# Patient Record
Sex: Male | Born: 1980 | Hispanic: Refuse to answer | Marital: Married | Smoking: Current every day smoker
Health system: Southern US, Community
[De-identification: ages and names within clinical notes are randomized; demographics above are authoritative.]

## PROBLEM LIST (undated history)

## (undated) DIAGNOSIS — I10 Essential (primary) hypertension: Secondary | ICD-10-CM

## (undated) DIAGNOSIS — E669 Obesity, unspecified: Secondary | ICD-10-CM

## (undated) DIAGNOSIS — G510 Bell's palsy: Secondary | ICD-10-CM

## (undated) DIAGNOSIS — E119 Type 2 diabetes mellitus without complications: Secondary | ICD-10-CM

## (undated) DIAGNOSIS — G4733 Obstructive sleep apnea (adult) (pediatric): Secondary | ICD-10-CM

## (undated) DIAGNOSIS — F329 Major depressive disorder, single episode, unspecified: Secondary | ICD-10-CM

## (undated) DIAGNOSIS — R945 Abnormal results of liver function studies: Secondary | ICD-10-CM

## (undated) DIAGNOSIS — F32A Depression, unspecified: Secondary | ICD-10-CM

## (undated) HISTORY — PX: SKIN GRAFT: SHX250

## (undated) HISTORY — DX: Obstructive sleep apnea (adult) (pediatric): G47.33

## (undated) HISTORY — DX: Major depressive disorder, single episode, unspecified: F32.9

## (undated) HISTORY — DX: Obesity, unspecified: E66.9

## (undated) HISTORY — DX: Essential (primary) hypertension: I10

## (undated) HISTORY — DX: Type 2 diabetes mellitus without complications: E11.9

## (undated) HISTORY — PX: CERVICAL FUSION: SHX112

## (undated) HISTORY — DX: Depression, unspecified: F32.A

## (undated) HISTORY — DX: Abnormal results of liver function studies: R94.5

---

## 2010-08-03 ENCOUNTER — Encounter: Admission: RE | Admit: 2010-08-03 | Discharge: 2010-08-03 | Payer: Self-pay | Admitting: Neurosurgery

## 2010-08-20 ENCOUNTER — Inpatient Hospital Stay (HOSPITAL_COMMUNITY): Admission: RE | Admit: 2010-08-20 | Discharge: 2010-08-22 | Payer: Self-pay | Admitting: Neurosurgery

## 2011-01-30 ENCOUNTER — Emergency Department (HOSPITAL_COMMUNITY)
Admission: EM | Admit: 2011-01-30 | Discharge: 2011-01-30 | Disposition: A | Discharge status: Home / Self Care | Payer: 59 | Attending: Emergency Medicine | Admitting: Emergency Medicine

## 2011-01-30 ENCOUNTER — Emergency Department (HOSPITAL_COMMUNITY): Payer: 59

## 2011-01-30 DIAGNOSIS — R51 Headache: Secondary | ICD-10-CM | POA: Insufficient documentation

## 2011-01-30 DIAGNOSIS — S0990XA Unspecified injury of head, initial encounter: Secondary | ICD-10-CM | POA: Insufficient documentation

## 2011-01-30 DIAGNOSIS — Y9229 Other specified public building as the place of occurrence of the external cause: Secondary | ICD-10-CM | POA: Insufficient documentation

## 2011-01-30 DIAGNOSIS — S01501A Unspecified open wound of lip, initial encounter: Secondary | ICD-10-CM | POA: Insufficient documentation

## 2011-01-30 DIAGNOSIS — M542 Cervicalgia: Secondary | ICD-10-CM | POA: Insufficient documentation

## 2011-01-30 DIAGNOSIS — S0003XA Contusion of scalp, initial encounter: Secondary | ICD-10-CM | POA: Insufficient documentation

## 2011-01-30 DIAGNOSIS — S0510XA Contusion of eyeball and orbital tissues, unspecified eye, initial encounter: Secondary | ICD-10-CM | POA: Insufficient documentation

## 2011-03-11 LAB — CBC
HCT: 49.1 % (ref 39.0–52.0)
Hemoglobin: 17 g/dL (ref 13.0–17.0)
MCH: 31.8 pg (ref 26.0–34.0)
MCHC: 34.6 g/dL (ref 30.0–36.0)
MCV: 91.9 fL (ref 78.0–100.0)
Platelets: 225 10*3/uL (ref 150–400)
RBC: 5.34 MIL/uL (ref 4.22–5.81)
RDW: 11.9 % (ref 11.5–15.5)
WBC: 10 10*3/uL (ref 4.0–10.5)

## 2011-07-06 ENCOUNTER — Inpatient Hospital Stay (INDEPENDENT_AMBULATORY_CARE_PROVIDER_SITE_OTHER)
Admission: RE | Admit: 2011-07-06 | Discharge: 2011-07-06 | Disposition: A | Discharge status: Home / Self Care | Payer: 59 | Source: Ambulatory Visit | Attending: Emergency Medicine | Admitting: Emergency Medicine

## 2011-07-06 ENCOUNTER — Ambulatory Visit (INDEPENDENT_AMBULATORY_CARE_PROVIDER_SITE_OTHER): Payer: 59

## 2011-07-06 ENCOUNTER — Encounter (HOSPITAL_COMMUNITY): Payer: Self-pay

## 2011-07-06 DIAGNOSIS — S93409A Sprain of unspecified ligament of unspecified ankle, initial encounter: Secondary | ICD-10-CM
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
## 2012-05-28 ENCOUNTER — Ambulatory Visit (INDEPENDENT_AMBULATORY_CARE_PROVIDER_SITE_OTHER): Payer: 59 | Admitting: Family Medicine

## 2012-05-28 ENCOUNTER — Encounter: Payer: Self-pay | Admitting: Family Medicine

## 2012-05-28 VITALS — BP 120/70 | HR 86 | Wt 362.0 lb

## 2012-05-28 DIAGNOSIS — R109 Unspecified abdominal pain: Secondary | ICD-10-CM

## 2012-05-28 NOTE — Patient Instructions (Addendum)
Take 2 Prilosec a day for the next 2 weeks and if no improvement, call me.

## 2012-05-28 NOTE — Progress Notes (Signed)
  Subjective:    Patient ID: Andre [name], male    DOB: 04-Feb-1981, 31 y.o.   MRN: [id number]  HPI He is here for evaluation of a six-month history of intermittent midepigastric discomfort with nausea. When he does have the discomfort, food does make it worse. He also notes that TUMS does help with the indigestion symptoms that he describes as different than the midepigastric discomfort. He's had no vomiting, diarrhea, black tarry stools. He has not tried any medications other than TUMS.   Review of Systems     Objective:   Physical Exam alert and in no distress. Tympanic membranes and canals are normal. Throat is clear. Tonsils are normal. Neck is supple without adenopathy or thyromegaly. Cardiac exam shows a regular sinus rhythm without murmurs or gallops. Lungs are clear to auscultation. Abdominal exam shows no masses or tenderness.        Assessment & Plan:  Abdominal pain, etiology unclear plan recommend 40 mg Prilosec for the next 2 weeks and then call me. At the end of the encounter he then mentioned she would've his mood swings. Further history indicates that there is a family history of mood disorder. Apparently that was diagnosed with this. He notes he has had trouble with this since his teenage years. Recommend he come back for further consultation concerning this.

## 2012-06-11 ENCOUNTER — Encounter: Payer: Self-pay | Admitting: Family Medicine

## 2012-06-11 ENCOUNTER — Ambulatory Visit (INDEPENDENT_AMBULATORY_CARE_PROVIDER_SITE_OTHER): Payer: 59 | Admitting: Family Medicine

## 2012-06-11 VITALS — BP 150/90 | HR 98 | Wt 346.0 lb

## 2012-06-11 DIAGNOSIS — R109 Unspecified abdominal pain: Secondary | ICD-10-CM

## 2012-06-11 DIAGNOSIS — F39 Unspecified mood [affective] disorder: Secondary | ICD-10-CM

## 2012-06-11 NOTE — Progress Notes (Signed)
  Subjective:    Patient ID: Andre [name], male    DOB: August 27, 1981, 31 y.o.   MRN: [id number]  HPI He is here for evaluation of abdominal pain. He states that the heartburn and indigestion have improved tremendously. He still does have abdominal pain but notes that stress does tend to make this worse. Further discussion indicates that he has had difficulty with mood swings for several years. He states that his mood can swing tender 20 times per day. He never gets euphoric. He cites stresses in his life being child custody as well as work. Family history significant for brother and father both with manic depressive disorder.   Review of Systems     Objective:   Physical Exam Alert and in no distress otherwise not examined      Assessment & Plan:  Abdominal pain. Mood disorder. I will refer him to the [facility] for further evaluation and treatment. He did tell him that I would be happy to followup with him concerning this once he is stabilized.

## 2012-08-28 ENCOUNTER — Ambulatory Visit: Payer: 59 | Admitting: Family Medicine

## 2012-12-17 ENCOUNTER — Ambulatory Visit (INDEPENDENT_AMBULATORY_CARE_PROVIDER_SITE_OTHER): Payer: 59 | Admitting: Family Medicine

## 2012-12-17 ENCOUNTER — Encounter: Payer: Self-pay | Admitting: Family Medicine

## 2012-12-17 VITALS — BP 160/100 | HR 75

## 2012-12-17 DIAGNOSIS — H6692 Otitis media, unspecified, left ear: Secondary | ICD-10-CM

## 2012-12-17 DIAGNOSIS — H6690 Otitis media, unspecified, unspecified ear: Secondary | ICD-10-CM

## 2012-12-17 DIAGNOSIS — R030 Elevated blood-pressure reading, without diagnosis of hypertension: Secondary | ICD-10-CM

## 2012-12-17 DIAGNOSIS — R079 Chest pain, unspecified: Secondary | ICD-10-CM

## 2012-12-17 MED ORDER — AMOXICILLIN 875 MG PO TABS: 875.0000 mg | ORAL_TABLET | Freq: Two times a day (BID) | ORAL | Status: DC

## 2012-12-17 NOTE — Patient Instructions (Signed)
Take 4 Advil 3 times per day for the headaches

## 2012-12-17 NOTE — Progress Notes (Signed)
  Subjective:    Patient ID: Andre [name], male    DOB: 12-May-1981, 31 y.o.   MRN: [id number]  HPI He has a two-week history of headache and dizzy, fatigue, rhinorrhea and feeling of his face and ears being hot  Nno sore throat, earache, fever. He also complains of a one-week history of chest discomfort but he does not describe it as heartburn. Activity has no affect.He has tried Aleve without success. He continues on Lamictal. He also states he has a previous history of elevated blood pressure and was on medication however lost weight and his blood pressure stayed in a good range.   Review of Systems     Objective:   Physical Exam alert and in no distress. Tympanic membrane on the right is normal, left shows retraction with fluid and loss of landmarks canals are normal. Throat is clear. Tonsils are normal. Neck is supple without adenopathy or thyromegaly. Cardiac exam shows a regular sinus rhythm without murmurs or gallops. Lungs are clear to auscultation. EKG shows no acute changes       Assessment & Plan:   1. LOM (left otitis media)  amoxicillin (AMOXIL) 875 MG tablet  2. Chest pain  PR ELECTROCARDIOGRAM, COMPLETE  3. Elevated blood pressure     he is to return here in 2 weeks for recheck on his blood pressure and to look at his left TM. Also discussed the need for him to make permanent lifestyle changes in regard to diet and exercise.

## 2012-12-21 ENCOUNTER — Ambulatory Visit (INDEPENDENT_AMBULATORY_CARE_PROVIDER_SITE_OTHER): Payer: 59 | Admitting: Medical

## 2012-12-21 ENCOUNTER — Encounter: Payer: Self-pay | Admitting: Medical

## 2012-12-21 VITALS — BP 132/98 | HR 89 | Temp 98.5°F | Resp 18 | Wt 353.0 lb

## 2012-12-21 DIAGNOSIS — R079 Chest pain, unspecified: Secondary | ICD-10-CM

## 2012-12-21 DIAGNOSIS — R51 Headache: Secondary | ICD-10-CM

## 2012-12-21 DIAGNOSIS — H8110 Benign paroxysmal vertigo, unspecified ear: Secondary | ICD-10-CM

## 2012-12-21 DIAGNOSIS — H6690 Otitis media, unspecified, unspecified ear: Secondary | ICD-10-CM

## 2012-12-21 LAB — POCT URINALYSIS DIPSTICK
Blood, UA: NEGATIVE
Glucose, UA: NEGATIVE
pH, UA: 7

## 2012-12-21 MED ORDER — MECLIZINE HCL 25 MG PO TABS: 25.0000 mg | ORAL_TABLET | Freq: Three times a day (TID) | ORAL | Status: DC | PRN

## 2012-12-21 NOTE — Progress Notes (Signed)
Subjective: Here for c/o severe headaches and dizziness.  Was seen here Monday this week for chest pain, ear infection, fatigue, dizziness, headache, elevated BP.   He still c/o dizziness, fatigue, headache.  Ear is not hurting currently but feels stopped up.  No sore throat, fever, but does have slight pressure in right sinus.  No chest pain, SOB, no swelling.  He reports headaches for weeks, dizziness for weeks.  Dizziness like room is spinning, and worse when moving quickly on the job or getting up real quick.  Had to stay out of work today for symptoms.   He is taking the antibiotic prescribed Monday.  Not taking decongestant or other medications.   Past Medical History  Diagnosis Date  . Depression    Review of Systems Constitutional: -fever, -chills, -sweats, -unexpected weight change ENT: -runny nose, -ear pain, -sore throat Cardiology:  -chest pain, -palpitations, -edema Respiratory: -cough, -shortness of breath, -wheezing Gastroenterology: -abdominal pain, -nausea, -vomiting, -diarrhea, -constipation  Hematology: -bleeding or bruising problems Musculoskeletal: -arthralgias, -myalgias, -joint swelling, -back pain Ophthalmology: -vision changes Urology: -dysuria, -difficulty urinating, -hematuria, -urinary frequency, -urgency Neurology: -weakness, +occasional tingling in hands bilat, -numbness, no speech changes, confusion, hearing loss, +occasional ringing in ears    Objective:   Physical Exam  Filed Vitals:   12/21/12 0938  BP: 130/98  Pulse: 69  Temp: 98.5 F (36.9 C)  Resp: 18    General appearance: alert, no distress, WD/WN  HEENT: normocephalic, sclerae anicteric, PERRLA, EOMi, left TM with some erythema, some retraction, right TM pearly, nares patent, no discharge or erythema, pharynx normal Oral cavity: MMM, no lesions Neck: supple, no lymphadenopathy, no thyromegaly, no masses, no bruits Heart: RRR, normal S1, S2, no murmurs Lungs: CTA bilaterally, no wheezes,  rhonchi, or rales Extremities: no edema, no cyanosis, no clubbing Pulses: 2+ symmetric, upper and lower extremities, normal cap refill Neurological: alert, oriented x 3, CN2-12 intact, strength normal upper extremities and lower extremities, sensation normal throughout, DTRs 2+ throughout, no cerebellar signs, gait normal Psychiatric: normal affect, behavior normal, pleasant    Assessment and Plan :    Encounter Diagnoses  Name Primary?  Marland Kitchen BPPV (benign paroxysmal positional vertigo) Yes  . Otitis media   . Headache   . Chest pain    BPPV - reviewed orthostatic vitals and urinalysis today.  Begin meclizine, advised of diagnosis, increase water intake as he is mildly dehydrated.  Call if not much improved in 1 wk  Otitis media- improved, but finish antibiotic  Headache -OTC Ibuprofen or Tylenol, symptoms should resolve as the BPPV and OM improved.  Recheck if not improving.  Chest pain - resolved   Follow-up if not improving or worse in the next 3-4 days.  F/u as planned to recheck on elevated BP.

## 2012-12-31 ENCOUNTER — Ambulatory Visit (INDEPENDENT_AMBULATORY_CARE_PROVIDER_SITE_OTHER): Payer: 59 | Admitting: Family Medicine

## 2012-12-31 ENCOUNTER — Encounter: Payer: Self-pay | Admitting: Family Medicine

## 2012-12-31 VITALS — BP 150/90 | HR 85 | Ht 75.0 in | Wt 354.0 lb

## 2012-12-31 DIAGNOSIS — I10 Essential (primary) hypertension: Secondary | ICD-10-CM

## 2012-12-31 DIAGNOSIS — H6692 Otitis media, unspecified, left ear: Secondary | ICD-10-CM

## 2012-12-31 DIAGNOSIS — H6690 Otitis media, unspecified, unspecified ear: Secondary | ICD-10-CM

## 2012-12-31 MED ORDER — HYDROCHLOROTHIAZIDE 12.5 MG PO CAPS: 12.5000 mg | ORAL_CAPSULE | Freq: Every day | ORAL | Status: DC

## 2012-12-31 MED ORDER — AMOXICILLIN-POT CLAVULANATE 875-125 MG PO TABS: 1.0000 | ORAL_TABLET | Freq: Two times a day (BID) | ORAL | Status: AC

## 2012-12-31 NOTE — Progress Notes (Signed)
  Subjective:    Patient ID: Andre [name], male    DOB: [birth year], 32 y.o.   MRN: [id number]  HPI He is here for recheck. He is still having some difficulty with intermittent dizziness especially with certain head positions. He also still feels a popping sensation in his left ear.  Review of Systems     Objective:   Physical Exam Alert and in no distress. Left tympanic membrane is show better landmarks but there is still some retraction and erythema. Right tympanic membrane is normal. Blood pressure is recorded.       Assessment & Plan:   1. Hypertension  hydrochlorothiazide (MICROZIDE) 12.5 MG capsule  2. LOM (left otitis media)  amoxicillin-clavulanate (AUGMENTIN) 875-125 MG per tablet   review his record indicates last several blood pressure readings have been elevated and I have therefore decided to treat him. I will also switch him to Augmentin. Recheck blood pressure and tympanic membranes in one month.

## 2013-01-21 ENCOUNTER — Emergency Department: Payer: Self-pay | Admitting: Unknown Physician Specialty

## 2013-01-28 ENCOUNTER — Emergency Department: Payer: Self-pay | Admitting: Emergency Medicine

## 2013-02-04 ENCOUNTER — Ambulatory Visit (INDEPENDENT_AMBULATORY_CARE_PROVIDER_SITE_OTHER): Payer: 59 | Admitting: Family Medicine

## 2013-02-04 ENCOUNTER — Encounter: Payer: Self-pay | Admitting: Family Medicine

## 2013-02-04 VITALS — BP 126/82 | HR 70

## 2013-02-04 DIAGNOSIS — Z79899 Other long term (current) drug therapy: Secondary | ICD-10-CM

## 2013-02-04 DIAGNOSIS — I10 Essential (primary) hypertension: Secondary | ICD-10-CM

## 2013-02-04 DIAGNOSIS — E1159 Type 2 diabetes mellitus with other circulatory complications: Secondary | ICD-10-CM | POA: Insufficient documentation

## 2013-02-04 LAB — ELECTROLYTE PANEL
CO2: 27 mEq/L (ref 19–32)
Chloride: 106 mEq/L (ref 96–112)
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
## 2013-02-05 NOTE — Progress Notes (Signed)
Quick Note:  Pt informed of labs and verbalized understanding ______

## 2013-03-05 ENCOUNTER — Other Ambulatory Visit: Payer: Self-pay | Admitting: Neurosurgery

## 2013-03-05 DIAGNOSIS — M542 Cervicalgia: Secondary | ICD-10-CM

## 2013-03-05 DIAGNOSIS — M5410 Radiculopathy, site unspecified: Secondary | ICD-10-CM

## 2013-03-27 ENCOUNTER — Ambulatory Visit
Admission: RE | Admit: 2013-03-27 | Discharge: 2013-03-27 | Disposition: A | Discharge status: Home / Self Care | Payer: 59 | Source: Ambulatory Visit | Attending: Neurosurgery | Admitting: Neurosurgery

## 2013-03-27 VITALS — BP 137/75 | HR 62 | Ht 76.0 in | Wt 351.0 lb

## 2013-03-27 DIAGNOSIS — M5410 Radiculopathy, site unspecified: Secondary | ICD-10-CM

## 2013-03-27 DIAGNOSIS — M542 Cervicalgia: Secondary | ICD-10-CM

## 2013-03-27 MED ORDER — MEPERIDINE HCL 100 MG/ML IJ SOLN
100.0000 mg | Freq: Once | INTRAMUSCULAR | Status: AC
  Administered 2013-03-27: 100 mg via INTRAMUSCULAR

## 2013-03-27 MED ORDER — IOHEXOL 300 MG/ML  SOLN
10.0000 mL | Freq: Once | INTRAMUSCULAR | Status: AC | PRN
  Administered 2013-03-27: 10 mL via INTRATHECAL

## 2013-03-27 MED ORDER — DIAZEPAM 5 MG PO TABS
10.0000 mg | ORAL_TABLET | Freq: Once | ORAL | Status: AC
  Administered 2013-03-27: 10 mg via ORAL

## 2013-03-27 MED ORDER — ONDANSETRON HCL 4 MG/2ML IJ SOLN
4.0000 mg | Freq: Once | INTRAMUSCULAR | Status: AC
  Administered 2013-03-27: 4 mg via INTRAMUSCULAR

## 2013-04-29 ENCOUNTER — Ambulatory Visit (INDEPENDENT_AMBULATORY_CARE_PROVIDER_SITE_OTHER): Payer: 59 | Admitting: Family Medicine

## 2013-04-29 ENCOUNTER — Encounter: Payer: Self-pay | Admitting: Family Medicine

## 2013-04-29 VITALS — BP 130/88 | HR 79 | Wt 350.0 lb

## 2013-04-29 DIAGNOSIS — R7309 Other abnormal glucose: Secondary | ICD-10-CM

## 2013-04-29 LAB — CBC WITH DIFFERENTIAL/PLATELET
Basophils Relative: 0 % (ref 0–1)
Hemoglobin: 17.1 g/dL — ABNORMAL HIGH (ref 13.0–17.0)
Lymphocytes Relative: 26 % (ref 12–46)
Lymphs Abs: 1.9 10*3/uL (ref 0.7–4.0)
MCHC: 35.3 g/dL (ref 30.0–36.0)
Monocytes Relative: 7 % (ref 3–12)
Neutro Abs: 4.5 10*3/uL (ref 1.7–7.7)
Neutrophils Relative %: 65 % (ref 43–77)
RBC: 5.41 MIL/uL (ref 4.22–5.81)
WBC: 7 10*3/uL (ref 4.0–10.5)

## 2013-04-29 LAB — POCT URINALYSIS DIPSTICK
Glucose, UA: NEGATIVE
Nitrite, UA: NEGATIVE
Spec Grav, UA: 1.02
Urobilinogen, UA: NEGATIVE

## 2013-04-29 LAB — POCT GLYCOSYLATED HEMOGLOBIN (HGB A1C): Hemoglobin A1C: 5.5

## 2013-04-29 LAB — HEMOGLOBIN A1C
Hgb A1c MFr Bld: 5.4 % (ref <5.7)
Mean Plasma Glucose: 108 mg/dL (ref <117)

## 2013-04-29 LAB — LIPID PANEL
Cholesterol: 142 mg/dL (ref 0–200)
Total CHOL/HDL Ratio: 4.6 Ratio

## 2013-04-29 LAB — COMPREHENSIVE METABOLIC PANEL
Albumin: 4.2 g/dL (ref 3.5–5.2)
Alkaline Phosphatase: 69 U/L (ref 39–117)
Chloride: 106 mEq/L (ref 96–112)
Glucose, Bld: 87 mg/dL (ref 70–99)
Potassium: 4.2 mEq/L (ref 3.5–5.3)
Sodium: 139 mEq/L (ref 135–145)
Total Protein: 6.6 g/dL (ref 6.0–8.3)

## 2013-04-29 NOTE — Progress Notes (Signed)
  Subjective:    Patient ID: Andre [name], male    DOB: Dec 09, 1981, 32 y.o.   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 The exam showed sugar in his urine. A fingerstick at that time was 136 however he states that he ate prior to that.he does not complain of polyuria, polydipsia or weight loss.   Review of Systems     Objective:   Physical Exam Alert and in no distress. Hemoglobin A1c is 5.5 Urine dipstick showed only trace protein and no sugar       Assessment & Plan:  Abnormal blood sugar - Plan: CBC with Differential, Comprehensive metabolic panel, Lipid panel, Hemoglobin A1c  Morbid obesity I will repeat the hemoglobin A1c and get routine blood screening on him. Discussed the diagnosis of diabetes and also discussed diet and exercise changes with him. Told him that no matter what the results show, in making permanent changes in his lifestyle to help reduce his weight would be to his benefit.

## 2013-04-29 NOTE — Addendum Note (Signed)
Addended by: Barbette Or A on: 04/29/2013 12:30 PM   Modules accepted: Orders

## 2013-04-30 NOTE — Progress Notes (Signed)
Quick Note:  Pt informed and form faxed and pt coming by to pick up ______

## 2013-04-30 NOTE — Progress Notes (Signed)
Quick Note:  Fax the paperwork to the [facility] doctor with a copy of the lab data. Call the patient and in no come by and pick up the form and that we send information to Dr. ______

## 2014-04-07 ENCOUNTER — Ambulatory Visit
Admission: RE | Admit: 2014-04-07 | Discharge: 2014-04-07 | Disposition: A | Discharge status: Home / Self Care | Payer: 59 | Source: Ambulatory Visit | Attending: Medical | Admitting: Medical

## 2014-04-07 ENCOUNTER — Encounter: Payer: Self-pay | Admitting: Medical

## 2014-04-07 ENCOUNTER — Ambulatory Visit (INDEPENDENT_AMBULATORY_CARE_PROVIDER_SITE_OTHER): Payer: 59 | Admitting: Medical

## 2014-04-07 VITALS — BP 122/80 | HR 80 | Temp 98.3°F | Resp 16 | Wt 346.0 lb

## 2014-04-07 DIAGNOSIS — R509 Fever, unspecified: Secondary | ICD-10-CM

## 2014-04-07 DIAGNOSIS — N5082 Scrotal pain: Secondary | ICD-10-CM

## 2014-04-07 DIAGNOSIS — R059 Cough, unspecified: Secondary | ICD-10-CM

## 2014-04-07 DIAGNOSIS — R05 Cough: Secondary | ICD-10-CM

## 2014-04-07 DIAGNOSIS — R042 Hemoptysis: Secondary | ICD-10-CM

## 2014-04-07 DIAGNOSIS — R61 Generalized hyperhidrosis: Secondary | ICD-10-CM

## 2014-04-07 DIAGNOSIS — F17200 Nicotine dependence, unspecified, uncomplicated: Secondary | ICD-10-CM

## 2014-04-07 DIAGNOSIS — N509 Disorder of male genital organs, unspecified: Secondary | ICD-10-CM

## 2014-04-07 DIAGNOSIS — R0602 Shortness of breath: Secondary | ICD-10-CM

## 2014-04-07 LAB — CBC WITH DIFFERENTIAL/PLATELET
BASOS ABS: 0 10*3/uL (ref 0.0–0.1)
BASOS PCT: 0 % (ref 0–1)
Eosinophils Absolute: 0.1 10*3/uL (ref 0.0–0.7)
Eosinophils Relative: 2 % (ref 0–5)
HCT: 47.3 % (ref 39.0–52.0)
Hemoglobin: 16.7 g/dL (ref 13.0–17.0)
Lymphocytes Relative: 36 % (ref 12–46)
Lymphs Abs: 1.7 10*3/uL (ref 0.7–4.0)
MCH: 32 pg (ref 26.0–34.0)
MCHC: 35.3 g/dL (ref 30.0–36.0)
MCV: 90.6 fL (ref 78.0–100.0)
MONO ABS: 0.6 10*3/uL (ref 0.1–1.0)
Monocytes Relative: 12 % (ref 3–12)
NEUTROS ABS: 2.4 10*3/uL (ref 1.7–7.7)
Neutrophils Relative %: 50 % (ref 43–77)
PLATELETS: 211 10*3/uL (ref 150–400)
RBC: 5.22 MIL/uL (ref 4.22–5.81)
RDW: 12.3 % (ref 11.5–15.5)
WBC: 4.7 10*3/uL (ref 4.0–10.5)

## 2014-04-07 MED ORDER — AMOXICILLIN-POT CLAVULANATE 875-125 MG PO TABS: 1.0000 | ORAL_TABLET | Freq: Two times a day (BID) | ORAL | Status: DC

## 2014-04-07 MED ORDER — ALBUTEROL SULFATE HFA 108 (90 BASE) MCG/ACT IN AERS: 2.0000 | INHALATION_SPRAY | Freq: Four times a day (QID) | RESPIRATORY_TRACT | Status: DC | PRN

## 2014-04-07 NOTE — Progress Notes (Signed)
   Subjective:   Andre [name] is a 33 y.o. male presenting on 04/07/2014 with Cough and Fever  Been sick x 2 months.   Symptoms include over 1 month of cough, productive at times.   2 wk ago saw blood tinged mucous.   Has bloody sinus drainage.   Has sinus pressure, ear pain, some sore throat, gets some nausea and vomiting with cough.  Had fever this past week, and prior to that had intermittent fever.  Has had some night sweats.  Has had some weight loss intentional.  Has bumpy irritated rash on right flank a month ago, attributed this to allergy to detergent.  No sick contacts.   He is a smoker.  15 pack year history +.  Just recently reduced to <1ppd.  He also mentions some ongoing scrotal discomfort.   No discharge, no concern for STD.  No other aggravating or relieving factors.  No other complaint.  Review of Systems ROS as in subjective      Objective:    Filed Vitals:   04/07/14 1357  BP: 122/80  Pulse: 80  Temp: 98.3 F (36.8 C)  Resp: 16    General appearance: alert, no distress, WD/WN, obese white male HEENT: normocephalic, sclerae anicteric, left TM flat and with erythema, right TM pearly, nares with turbinated edema, mucoid discharge and erythema, pharynx normal Oral cavity: MMM, no lesions Neck: supple, no lymphadenopathy, no thyromegaly, no masses Heart: RRR, normal S1, S2, no murmurs Lungs: decreased breath sounds in general, few scattered wheezes,few rhonchi Abdomen: +bs, soft, non tender, non distended, no masses, no hepatomegaly, no splenomegaly Pulses: 2+ symmetric, upper and lower extremities, normal cap refill GU: +varicoceles bilat, no other mass, no swelling, no erythema, nontender, circumcised, no lymphadenopathy     Assessment: Encounter Diagnoses  Name Primary?  . Cough Yes  . SOB (shortness of breath)   . Tobacco use disorder   . Fever, unspecified   . Night sweat   . Hemoptysis   . Scrotal pain      Plan: discussed possible  causes including bronchitis, possible pneumonia, sinusitis, but can't rule out other process.  Begin Augmentin, albuterol inhaler, stop tobacco advised.  F/u pending studies.  Andre [name] was seen today for cough and fever.  Diagnoses and associated orders for this visit:  Cough - DG Chest 2 View; Future - CBC with Differential  SOB (shortness of breath) - DG Chest 2 View; Future - CBC with Differential  Tobacco use disorder - DG Chest 2 View; Future - CBC with Differential  Fever, unspecified - DG Chest 2 View; Future - CBC with Differential  Night sweat - DG Chest 2 View; Future - CBC with Differential  Hemoptysis - DG Chest 2 View; Future - CBC with Differential  Scrotal pain  Other Orders - amoxicillin-clavulanate (AUGMENTIN) 875-125 MG per tablet; Take 1 tablet by mouth 2 (two) times daily. - albuterol (PROVENTIL HFA;VENTOLIN HFA) 108 (90 BASE) MCG/ACT inhaler; Inhale 2 puffs into the lungs every 6 (six) hours as needed for wheezing or shortness of breath.     Return pending studies.

## 2014-04-08 ENCOUNTER — Encounter: Payer: Self-pay | Admitting: Medical

## 2014-09-30 ENCOUNTER — Telehealth: Payer: Self-pay | Admitting: Medical

## 2014-09-30 ENCOUNTER — Ambulatory Visit (INDEPENDENT_AMBULATORY_CARE_PROVIDER_SITE_OTHER): Payer: 59 | Admitting: Medical

## 2014-09-30 ENCOUNTER — Encounter: Payer: Self-pay | Admitting: Medical

## 2014-09-30 VITALS — BP 130/90 | HR 78 | Temp 98.4°F | Resp 20 | Wt 364.0 lb

## 2014-09-30 DIAGNOSIS — Z720 Tobacco use: Secondary | ICD-10-CM

## 2014-09-30 DIAGNOSIS — J988 Other specified respiratory disorders: Secondary | ICD-10-CM

## 2014-09-30 DIAGNOSIS — T148XXA Other injury of unspecified body region, initial encounter: Secondary | ICD-10-CM

## 2014-09-30 DIAGNOSIS — T148 Other injury of unspecified body region: Secondary | ICD-10-CM

## 2014-09-30 DIAGNOSIS — R0789 Other chest pain: Secondary | ICD-10-CM

## 2014-09-30 DIAGNOSIS — R062 Wheezing: Secondary | ICD-10-CM

## 2014-09-30 DIAGNOSIS — F17200 Nicotine dependence, unspecified, uncomplicated: Secondary | ICD-10-CM

## 2014-09-30 DIAGNOSIS — I10 Essential (primary) hypertension: Secondary | ICD-10-CM

## 2014-09-30 LAB — GLUCOSE, POCT (MANUAL RESULT ENTRY): POC Glucose: 155 mg/dl — AB (ref 70–99)

## 2014-09-30 MED ORDER — ALBUTEROL SULFATE HFA 108 (90 BASE) MCG/ACT IN AERS: 2.0000 | INHALATION_SPRAY | Freq: Four times a day (QID) | RESPIRATORY_TRACT | Status: DC | PRN

## 2014-09-30 MED ORDER — ALBUTEROL SULFATE (2.5 MG/3ML) 0.083% IN NEBU: 2.5000 mg | INHALATION_SOLUTION | Freq: Once | RESPIRATORY_TRACT | Status: DC

## 2014-09-30 MED ORDER — AMOXICILLIN-POT CLAVULANATE 875-125 MG PO TABS: 1.0000 | ORAL_TABLET | Freq: Two times a day (BID) | ORAL | Status: DC

## 2014-09-30 NOTE — Telephone Encounter (Signed)
Patient is aware of his PFT appointment at [facility]

## 2014-09-30 NOTE — Progress Notes (Addendum)
Subjective:    Andre [name] is a 33 y.o. male who presents for evaluation of cough and wheezing.   He reports 2 day hx/o cough, wheezing, SOB, head and chest congestion, headache, feels sick and achy.   Has numerous current sick contacts.  Using nothing for symptoms.  In general he is a 18+ pack year smoker, states that he knows he needs to quit, experiences intermittent SOB and chest discomfort from time to time.  No hx/o heart disease or asthma.  He does sometimes get allergy problems.  He is not sure about family history.  He does report mild swelling in his legs ongoing, has wound of left lower leg that although improved, doesn't seem to went to heal all the way.  Wound been there a month or more, not sure.  Patient's cardiac risk factors are: hypertension, male gender, obesity (BMI >= 30 kg/m2) and smoking/ tobacco exposure. Patient's risk factors for DVT/PE: none. Previous cardiac testing: none.  The following portions of the patient's history were reviewed and updated as appropriate: allergies, current medications, past family history, past medical history, past social history, past surgical history and problem list.  Review of Systems As in subjective   Past Medical History  Diagnosis Date  . Depression     Objective:     BP 130/90  Pulse 78  Temp(Src) 98.4 F (36.9 C) (Oral)  Resp 20  Wt 364 lb (165.109 kg)  SpO2 97%  General appearance: alert, no distress, WD/WN,obese white male  Skin: 3cm linear wound of left medial lower leg that is healing, no open wound, no erythema warmth or fluctuance HEENT: normocephalic, conjunctiva/corneas normal, sclerae anicteric, PERRLA, EOMi, nares with turbinate edema, clear discharge, mild erythema, pharynx with post nasal drainage, mild erythema Oral cavity: MMM, tongue normal, no lesions, no obvious tooth decay Neck: supple, no lymphadenopathy, no thyromegaly, no JVD, no bruits, no masses, normal ROM Chest: non tender, normal shape  and expansion Heart: RRR, normal S1, S2, no murmurs Lungs: scattered expiratory wheezes, +decreased breath sounds right lower lung fields, otherwise no rhonchi or rales Abdomen: +bs, soft, non tender, non distended, no masses, no hepatomegaly, no splenomegaly, no bruits Extremities: 1+ nonpitting LE edema, no cyanosis, no clubbing Pulses: 2+ symmetric, upper and lower extremities, normal cap refill    Adult ECG Report  Indication: chest pressure  Rate: 76 bpm  Rhythm: normal sinus rhythm  QRS Axis: 72 degrees  PR Interval:  QRS Duration: 96ms  QTc:  Conduction Disturbances: none  Other Abnormalities: none  Patient's cardiac risk factors are: hypertension, male gender, obesity (BMI >= 30 kg/m2) and smoking/ tobacco exposure.  EKG comparison: none  Narrative Interpretation: normal EKG     Assessment:   Encounter Diagnoses  Name Primary?  . Wheezing Yes  . Smoker   . Respiratory infection   . Chest discomfort   . Obesity, morbid      Plan   Upon presentation after brief hx/o and exam, began Albuterol nebulized solutoin with some improvement in wheezing and SOB.  We discussed his symptoms, concerns, possible causes.  He appears to have bronchitis picture today, similar to his history in 03/2014.  Given hx/o tobacco use and this being a second similar episode this year, need to pursue PFTs.  He will begin Albuterol inhaler, Augmentin, rest, hydrate well, begin OTC antihistamine at bedtime.  If not improving in 48 hours, call or return.  otherwise referral for PFTs, and will need f/u on BP, obesity,  reducing cardiac risk factors.   He is not ready to quit tobacco although we discussed risk of tobacco and need for cessation.

## 2014-09-30 NOTE — Telephone Encounter (Signed)
pls schedule him for spirometry/PFTs.  He needs to not being using albuterol the morning of or night before the tests.  So schedule out at least a week from now.

## 2015-02-16 ENCOUNTER — Ambulatory Visit (INDEPENDENT_AMBULATORY_CARE_PROVIDER_SITE_OTHER): Payer: 59 | Admitting: Family Medicine

## 2015-02-16 ENCOUNTER — Encounter: Payer: Self-pay | Admitting: Family Medicine

## 2015-02-16 VITALS — BP 130/74 | HR 82 | Ht 75.0 in | Wt 354.0 lb

## 2015-02-16 DIAGNOSIS — F17200 Nicotine dependence, unspecified, uncomplicated: Secondary | ICD-10-CM

## 2015-02-16 DIAGNOSIS — Z716 Tobacco abuse counseling: Secondary | ICD-10-CM

## 2015-02-16 DIAGNOSIS — Z720 Tobacco use: Secondary | ICD-10-CM

## 2015-02-16 MED ORDER — VARENICLINE TARTRATE 0.5 MG X 11 & 1 MG X 42 PO MISC: ORAL | Status: DC

## 2015-02-16 NOTE — Progress Notes (Signed)
   Subjective:    Patient ID: Andre [name], male    DOB: 1981-04-22, 34 y.o.   MRN: [id number]  HPI He is here for consult concerning quitting smoking. In the past he has tried gum, patches, cold [place]. This is work. He admits to smoking when he is bored, mad, riding in the car, after eating, on the phone and drinking. He apparently did call one time to an 800 number to get help. Smokes roughly 2 packs per day and has done so for approximately 18 years. Review of Systems     Objective:   Physical Exam Alert and in no distress otherwise not examined       Assessment & Plan:  Tobacco use disorder - Plan: varenicline (CHANTIX STARTING MONTH PAK) 0.5 MG X 11 & 1 MG X 42 tablet  Encounter for smoking cessation counseling - Plan: varenicline (CHANTIX STARTING MONTH PAK) 0.5 MG X 11 & 1 MG X 42 tablet  15 minutes spent discussing smoking cessation with him in regard to habit versus addiction. He would like to start Chantix. Recommend that he make a list of things to do instead of smoking. Also discussed possibly having a contest between him and the friend with $10 each day going into a glass jar. If there is failure the money has to be given to the other person. Recheck here in one month. Also recommend that he get involved in [place] smoking cessation classes.

## 2015-02-16 NOTE — Patient Instructions (Signed)
Off [phone] or you can try 800 quit now. Make a list of things that you will do when your board, mad, riding in the car, after eating, on the phone and drinking. Keep that list with you

## 2015-03-09 ENCOUNTER — Telehealth: Payer: Self-pay | Admitting: Family Medicine

## 2015-03-10 NOTE — Telephone Encounter (Signed)
Have him schedule an appointment so we can discuss the medicine. It has different side effects and needs to be discussed

## 2015-03-10 NOTE — Telephone Encounter (Signed)
P.A. Chantix denied, pt needs trial of Buproprion. Do you want to switch?

## 2015-03-16 ENCOUNTER — Ambulatory Visit: Payer: 59 | Admitting: Medical

## 2015-03-16 ENCOUNTER — Ambulatory Visit: Payer: 59 | Admitting: Family Medicine

## 2015-03-23 ENCOUNTER — Ambulatory Visit (INDEPENDENT_AMBULATORY_CARE_PROVIDER_SITE_OTHER): Payer: 59 | Admitting: Medical

## 2015-03-23 ENCOUNTER — Encounter: Payer: Self-pay | Admitting: Medical

## 2015-03-23 VITALS — BP 132/88 | HR 77 | Temp 98.2°F | Resp 15 | Wt 366.0 lb

## 2015-03-23 DIAGNOSIS — E669 Obesity, unspecified: Secondary | ICD-10-CM | POA: Diagnosis not present

## 2015-03-23 DIAGNOSIS — J449 Chronic obstructive pulmonary disease, unspecified: Secondary | ICD-10-CM

## 2015-03-23 DIAGNOSIS — R202 Paresthesia of skin: Secondary | ICD-10-CM

## 2015-03-23 DIAGNOSIS — G4733 Obstructive sleep apnea (adult) (pediatric): Secondary | ICD-10-CM | POA: Diagnosis not present

## 2015-03-23 DIAGNOSIS — R609 Edema, unspecified: Secondary | ICD-10-CM

## 2015-03-23 DIAGNOSIS — R0600 Dyspnea, unspecified: Secondary | ICD-10-CM

## 2015-03-23 DIAGNOSIS — F17200 Nicotine dependence, unspecified, uncomplicated: Secondary | ICD-10-CM

## 2015-03-23 DIAGNOSIS — Z720 Tobacco use: Secondary | ICD-10-CM

## 2015-03-23 DIAGNOSIS — R0602 Shortness of breath: Secondary | ICD-10-CM

## 2015-03-23 DIAGNOSIS — Z9989 Dependence on other enabling machines and devices: Secondary | ICD-10-CM

## 2015-03-23 LAB — COMPREHENSIVE METABOLIC PANEL
ALK PHOS: 69 U/L (ref 39–117)
ALT: 46 U/L (ref 0–53)
AST: 30 U/L (ref 0–37)
Albumin: 4.1 g/dL (ref 3.5–5.2)
BILIRUBIN TOTAL: 0.6 mg/dL (ref 0.2–1.2)
BUN: 12 mg/dL (ref 6–23)
CO2: 29 meq/L (ref 19–32)
CREATININE: 1.15 mg/dL (ref 0.50–1.35)
Calcium: 9.1 mg/dL (ref 8.4–10.5)
Chloride: 103 mEq/L (ref 96–112)
Glucose, Bld: 89 mg/dL (ref 70–99)
Potassium: 4 mEq/L (ref 3.5–5.3)
Sodium: 141 mEq/L (ref 135–145)
Total Protein: 6.5 g/dL (ref 6.0–8.3)

## 2015-03-23 LAB — POCT URINALYSIS DIPSTICK
BILIRUBIN UA: NEGATIVE
Blood, UA: NEGATIVE
Glucose, UA: NEGATIVE
KETONES UA: NEGATIVE
LEUKOCYTES UA: NEGATIVE
Nitrite, UA: NEGATIVE
Protein, UA: NEGATIVE
Spec Grav, UA: 1.025
Urobilinogen, UA: NEGATIVE
pH, UA: 6

## 2015-03-23 LAB — CBC
HEMATOCRIT: 46.9 % (ref 39.0–52.0)
HEMOGLOBIN: 16.1 g/dL (ref 13.0–17.0)
MCH: 31.2 pg (ref 26.0–34.0)
MCHC: 34.3 g/dL (ref 30.0–36.0)
MCV: 90.9 fL (ref 78.0–100.0)
MPV: 10.2 fL (ref 8.6–12.4)
PLATELETS: 231 10*3/uL (ref 150–400)
RBC: 5.16 MIL/uL (ref 4.22–5.81)
RDW: 12.5 % (ref 11.5–15.5)
WBC: 5.8 10*3/uL (ref 4.0–10.5)

## 2015-03-23 LAB — TSH: TSH: 0.702 u[IU]/mL (ref 0.350–4.500)

## 2015-03-23 MED ORDER — NICOTINE 21 MG/24HR TD PT24: 21.0000 mg | MEDICATED_PATCH | Freq: Every day | TRANSDERMAL | Status: DC

## 2015-03-23 MED ORDER — BUPROPION HCL ER (XL) 150 MG PO TB24: 150.0000 mg | ORAL_TABLET | Freq: Every day | ORAL | Status: DC

## 2015-03-23 NOTE — Patient Instructions (Signed)
YOU CAN QUIT SMOKING!  Talk to your medical provider about using medicines to help you quit. These include nicotine replacement gum, lozenges, or skin patches.  Consider calling 1-800-QUIT-NOW, a toll free 24/7 hotline with free counseling to help you quit.  If you are ready to quit smoking or are thinking about it, congratulations! You have chosen to help yourself be healthier and live longer! There are lots of different ways to quit smoking. Nicotine gum, nicotine patches, a nicotine inhaler, or nicotine nasal spray can help with physical craving. Hypnosis, support groups, and medicines help break the habit of smoking. TIPS TO GET OFF AND STAY OFF CIGARETTES  Learn to predict your moods. Do not let a bad situation be your excuse to have a cigarette. Some situations in your life might tempt you to have a cigarette.   Ask friends and co-workers not to smoke around you.   Make your home smoke-free.   Never have "just one" cigarette. It leads to wanting another and another. Remind yourself of your decision to quit.   On a card, make a list of your reasons for not smoking. Read it at least the same number of times a day as you have a cigarette. Tell yourself everyday, "I do not want to smoke. I choose not to smoke."   Ask someone at home or work to help you with your plan to quit smoking.   Have something planned after you eat or have a cup of coffee. Take a walk or get other exercise to perk you up. This will help to keep you from overeating.   Try a relaxation exercise to calm you down and decrease your stress. Remember, you may be tense and nervous the first two weeks after you quit. This will pass.   Find new activities to keep your hands busy. Play with a pen, coin, or rubber band. Doodle or draw things on paper.   Brush your teeth right after eating. This will help cut down the craving for the taste of tobacco after meals. You can try mouthwash too.   Try gum, breath mints, or diet  candy to keep something in your mouth.  IF YOU SMOKE AND WANT TO QUIT:  Do not stock up on cigarettes. Never buy a carton. Wait until one pack is finished before you buy another.   Never carry cigarettes with you at work or at home.   Keep cigarettes as far away from you as possible. Leave them with someone else.   Never carry matches or a lighter with you.   Ask yourself, "Do I need this cigarette or is this just a reflex?"   Bet with someone that you can quit. Put cigarette money in a piggy bank every morning. If you smoke, you give up the money. If you do not smoke, by the end of the week, you keep the money.   Keep trying. It takes 21 days to change a habit!  Document Released: 10/08/2009 Document Revised: 08/24/2011 Document Reviewed: 10/08/2009 Exit[employer] Patient Information 2012 [employer].   

## 2015-03-23 NOTE — Progress Notes (Signed)
Subjective: Here for recheck on smoking cessation.  Saw Dr. Susann GivensLalonde in February for this and insurance wouldn't cover Chantix.   They require use of Wellbutrin first which he has never used.  Has used the patches in the past and they help some.  Not using patches currently.   Hasn't done counseling for smoking cessation.  Currently smoking 2ppd.   Has been smoking since age 34yo, on average 2ppd.  Having some SOB, cough, wheezing intermittent.  Uses albuterol prn, no specific hx/o asthma/lung disease.   He notes some leg swelling and ankle swelling, feet tingling a lot of late.  After 20-30 minutes has to get up and walk around due to feet tingling.   Has had swelling for months, possibly a year, and tingling sensation a few months now.   No blurred vision, no polyuria, no polydipsia.  Eats fast food a lot given on the go with work.  nonfasting today.   Ate Bacon egg and cheese and hash round biscuit this morning.  Is up and down, climbing a lot at work.    Patient's cardiac risk factors are:  male gender, obesity (BMI >= 30 kg/m2) and smoking/ tobacco exposure.   No other aggravating or relieving factors. No other complaint.   Past Medical History  Diagnosis Date  . Depression     Objective: BP 132/88 mmHg  Pulse 77  Temp(Src) 98.2 F (36.8 C) (Oral)  Resp 15  Wt 366 lb (166.017 kg)  Wt Readings from Last 3 Encounters:  03/23/15 366 lb (166.017 kg)  02/16/15 354 lb (160.573 kg)  09/30/14 364 lb (165.109 kg)   Gen: obese white male, wd, wn, nad Skin: unremarkable, warm, dry HEENT: normocephalic, sclerae anicteric, PERRLA, EOMi, nares patent, no discharge or erythema, pharynx normal Oral cavity: MMM, no lesions Neck: supple, no lymphadenopathy, no thyromegaly, no masses, no JVD, left anterior neck surgical scar Heart: RRR, normal S1, S2, no murmurs Lungs: scattered wheezes, no rhonchi, or rales Abdomen: +bs, soft, non tender, non distended, no masses, no hepatomegaly, no  splenomegaly Musculoskeletal: arms and legs nontender, no swelling, no obvious deformity Extremities: no obvious edema, no cyanosis, no clubbing Pulses: 2+ symmetric, upper and lower extremities, normal cap refill Psychiatric: normal affect, behavior normal, pleasant  Neuro: normal UE and LE sensation, strength, DTRs  Assessment:  Encounter Diagnoses  Name Primary?  Marland Kitchen. Dyspnea Yes  . Tobacco use disorder   . SOB (shortness of breath)   . Edema   . Obesity   . Paresthesia of bilateral legs   . OSA on CPAP     Plan: Labs today, spirometry done in house.    Dyspnea, SOB, tobacco use - spirometry shows moderate obstructive defect, FEV1/FVC 72% predicted and FEV1 61% predicted, lung age 34yo, new diagnosis of COPD.   Begin Symbicrot trial, discussed risks/benefits, proper use, albuterol for rescue inhaler.  Recommended efforts at weight loss, smoking cessation.  F/u 38mo  Smoking cessation - begin trial of Wellbutrin + nicotine patch + counseling.   Advised 1-800-QUIT-NOW for counseling, discussed risks/benefits of Wellbutrin and nicotine patch.   Will have to fail these before insurance will cover chantix.   C/t efforts to cut down on tobacco and quit.     Edema, paresthesias - F/u pending labs.    OSA - c/t CPAP

## 2015-03-24 ENCOUNTER — Other Ambulatory Visit: Payer: Self-pay | Admitting: Family Medicine

## 2015-03-24 ENCOUNTER — Other Ambulatory Visit: Payer: Self-pay | Admitting: Medical

## 2015-03-24 DIAGNOSIS — E669 Obesity, unspecified: Secondary | ICD-10-CM

## 2015-03-24 LAB — HEMOGLOBIN A1C
HEMOGLOBIN A1C: 6 % — AB (ref <5.7)
MEAN PLASMA GLUCOSE: 126 mg/dL — AB (ref <117)

## 2015-03-24 MED ORDER — ALBUTEROL SULFATE HFA 108 (90 BASE) MCG/ACT IN AERS: 2.0000 | INHALATION_SPRAY | Freq: Four times a day (QID) | RESPIRATORY_TRACT | Status: DC | PRN

## 2015-03-24 MED ORDER — BUDESONIDE-FORMOTEROL FUMARATE 160-4.5 MCG/ACT IN AERO: 2.0000 | INHALATION_SPRAY | Freq: Two times a day (BID) | RESPIRATORY_TRACT | Status: DC

## 2015-03-26 ENCOUNTER — Telehealth: Payer: Self-pay | Admitting: Medical

## 2015-03-26 NOTE — Progress Notes (Signed)
Duplicate entry

## 2015-03-27 NOTE — Telephone Encounter (Signed)
[facility] Step 1 approved til 03/25/16, faxed pharmacy, Pt informed

## 2015-04-03 ENCOUNTER — Telehealth: Payer: Self-pay | Admitting: Medical

## 2015-04-03 NOTE — Telephone Encounter (Signed)
lm

## 2015-06-02 ENCOUNTER — Encounter (HOSPITAL_COMMUNITY): Payer: Self-pay | Admitting: *Deleted

## 2015-06-02 ENCOUNTER — Emergency Department (HOSPITAL_COMMUNITY): Payer: 59

## 2015-06-02 ENCOUNTER — Encounter: Payer: Self-pay | Admitting: Medical

## 2015-06-02 ENCOUNTER — Ambulatory Visit (INDEPENDENT_AMBULATORY_CARE_PROVIDER_SITE_OTHER): Payer: 59 | Admitting: Medical

## 2015-06-02 ENCOUNTER — Emergency Department (HOSPITAL_COMMUNITY)
Admission: EM | Admit: 2015-06-02 | Discharge: 2015-06-02 | Discharge status: Against Medical Advice | Payer: 59 | Attending: Emergency Medicine | Admitting: Emergency Medicine

## 2015-06-02 VITALS — BP 132/90 | HR 77 | Temp 98.1°F | Resp 15 | Wt 362.0 lb

## 2015-06-02 DIAGNOSIS — F17200 Nicotine dependence, unspecified, uncomplicated: Secondary | ICD-10-CM

## 2015-06-02 DIAGNOSIS — J449 Chronic obstructive pulmonary disease, unspecified: Secondary | ICD-10-CM

## 2015-06-02 DIAGNOSIS — R41840 Attention and concentration deficit: Secondary | ICD-10-CM | POA: Diagnosis not present

## 2015-06-02 DIAGNOSIS — R200 Anesthesia of skin: Secondary | ICD-10-CM | POA: Insufficient documentation

## 2015-06-02 DIAGNOSIS — R202 Paresthesia of skin: Secondary | ICD-10-CM

## 2015-06-02 DIAGNOSIS — R42 Dizziness and giddiness: Secondary | ICD-10-CM | POA: Diagnosis not present

## 2015-06-02 DIAGNOSIS — Z9989 Dependence on other enabling machines and devices: Secondary | ICD-10-CM

## 2015-06-02 DIAGNOSIS — R208 Other disturbances of skin sensation: Secondary | ICD-10-CM | POA: Diagnosis not present

## 2015-06-02 DIAGNOSIS — Z720 Tobacco use: Secondary | ICD-10-CM | POA: Diagnosis not present

## 2015-06-02 DIAGNOSIS — G4733 Obstructive sleep apnea (adult) (pediatric): Secondary | ICD-10-CM

## 2015-06-02 DIAGNOSIS — R159 Full incontinence of feces: Secondary | ICD-10-CM

## 2015-06-02 DIAGNOSIS — R7301 Impaired fasting glucose: Secondary | ICD-10-CM

## 2015-06-02 LAB — COMPREHENSIVE METABOLIC PANEL
ALT: 69 U/L — ABNORMAL HIGH (ref 17–63)
AST: 40 U/L (ref 15–41)
Albumin: 4 g/dL (ref 3.5–5.0)
Alkaline Phosphatase: 63 U/L (ref 38–126)
Anion gap: 7 (ref 5–15)
BUN: 12 mg/dL (ref 6–20)
CO2: 26 mmol/L (ref 22–32)
Calcium: 9.1 mg/dL (ref 8.9–10.3)
Chloride: 107 mmol/L (ref 101–111)
Creatinine, Ser: 1.13 mg/dL (ref 0.61–1.24)
GFR calc Af Amer: >60 mL/min (ref >60)
GFR calc non Af Amer: >60 mL/min (ref >60)
Glucose, Bld: 94 mg/dL (ref 65–99)
Potassium: 4.1 mmol/L (ref 3.5–5.1)
Sodium: 140 mmol/L (ref 135–145)
Total Bilirubin: 0.6 mg/dL (ref 0.3–1.2)
Total Protein: 7.1 g/dL (ref 6.5–8.1)

## 2015-06-02 LAB — CBC
HEMATOCRIT: 47 % (ref 39.0–52.0)
Hemoglobin: 16.3 g/dL (ref 13.0–17.0)
MCH: 31.7 pg (ref 26.0–34.0)
MCHC: 34.7 g/dL (ref 30.0–36.0)
MCV: 91.3 fL (ref 78.0–100.0)
Platelets: 237 10*3/uL (ref 150–400)
RBC: 5.15 MIL/uL (ref 4.22–5.81)
RDW: 11.6 % (ref 11.5–15.5)
WBC: 7.3 10*3/uL (ref 4.0–10.5)

## 2015-06-02 LAB — DIFFERENTIAL
BASOS PCT: 0 % (ref 0–1)
Basophils Absolute: 0 10*3/uL (ref 0.0–0.1)
EOS PCT: 2 % (ref 0–5)
Eosinophils Absolute: 0.1 10*3/uL (ref 0.0–0.7)
Lymphocytes Relative: 27 % (ref 12–46)
Lymphs Abs: 2 10*3/uL (ref 0.7–4.0)
Monocytes Absolute: 0.5 10*3/uL (ref 0.1–1.0)
Monocytes Relative: 6 % (ref 3–12)
NEUTROS PCT: 65 % (ref 43–77)
Neutro Abs: 4.7 10*3/uL (ref 1.7–7.7)

## 2015-06-02 LAB — I-STAT CHEM 8, ED
BUN: 14 mg/dL (ref 6–20)
Calcium, Ion: 1.11 mmol/L — ABNORMAL LOW (ref 1.12–1.23)
Chloride: 104 mmol/L (ref 101–111)
Creatinine, Ser: 1.2 mg/dL (ref 0.61–1.24)
GLUCOSE: 92 mg/dL (ref 65–99)
HEMATOCRIT: 49 % (ref 39.0–52.0)
Hemoglobin: 16.7 g/dL (ref 13.0–17.0)
Potassium: 3.9 mmol/L (ref 3.5–5.1)
Sodium: 142 mmol/L (ref 135–145)
TCO2: 24 mmol/L (ref 0–100)

## 2015-06-02 LAB — APTT: aPTT: 28 seconds (ref 24–37)

## 2015-06-02 LAB — ETHANOL

## 2015-06-02 LAB — PROTIME-INR
INR: 1.17 (ref 0.00–1.49)
PROTHROMBIN TIME: 15 s (ref 11.6–15.2)

## 2015-06-02 LAB — I-STAT TROPONIN, ED: Troponin i, poc: 0.01 ng/mL (ref 0.00–0.08)

## 2015-06-02 NOTE — Progress Notes (Signed)
Subjective:    Andre [name] is a 34 y.o. male who presents for evaluation of numbness.  He has a history significant for new diagnosis of COPD in march 2016 here, smoker, morbid obesity, OSA on CPAP, impaired glucose and poor diet.  He notes that over the last week has felt tired, has had some problems remember things and concentrating on things that he normally would do on the job without any problem.  His lips felt numb a week ago. Has had right arm tingling and numbness over the past 48 hours, and for the last several hours has noted right facial numbness.  Denies slurred speech, confusion, but has had dizziness.   He denies chest pain, swelling, palpitations, no vision or hearing changes.    Since last visit he did start Symbicort once daily which has helped with breathing, but he still smokes.     Out of the blue he had an episode of fecal incontinence few days ago.  He lives alone.  He has been checking his sugar some, was 94 yesterday.  No other aggravating or relieving factors. No other complaint.  Past Medical History  Diagnosis Date  . Depression    History reviewed. No pertinent family history.   Review of Systems Constitutional: denies fever, chills, sweats, unexpected weight change, anorexia, +fatigue ENT: no runny nose, ear pain, sore throat, hoarseness, sinus pain, hearing loss, epistaxis Cardiology: denies palpitations, edema, orthopnea, paroxysmal nocturnal dyspnea Respiratory: denies cough, shortness of breath, dyspnea on exertion, wheezing, hemoptysis Gastroenterology: denies abdominal pain, nausea, vomiting, diarrhea, constipation,  Hematology: denies bleeding or bruising problems Musculoskeletal: denies arthralgias, myalgias, joint swelling, back pain, neck pain, cramping, gait changes Ophthalmology: denies vision changes Urology: denies dysuria, difficulty urinating, hematuria, urinary frequency, urgency, incontinence Psychology: denies depressed mood,  agitation    Objective:     BP 132/90 mmHg  Pulse 77  Temp(Src) 98.1 F (36.7 C) (Oral)  Resp 15  Wt 362 lb (164.202 kg)  General appearance: alert, no distress, WD/WN,obese white male  HEENT: normocephalic, conjunctiva/corneas normal, sclerae anicteric, PERRLA, EOMi, nares patent, no discharge or erythema, pharynx normal Oral cavity: MMM, tongue normal, teeth normal Neck: supple, no lymphadenopathy, no thyromegaly, no JVD, no bruits, no masses, normal ROM Chest: non tender, normal shape and expansion Heart: RRR, normal S1, S2, no murmurs Lungs: decreased breath sounds in general, no wheezes, rhonchi, or rales Extremities: no edema, no cyanosis, no clubbing Pulses: 2+ symmetric, upper and lower extremities, normal cap refill Neurological: with MMSE missed 1/3 on recall, otherwise normal MMSE, alert, oriented x 3, there is decreased sensation of right side in general to light touch and to discrimination compared to left side including face, arm, and leg, unable to do heel to toe, almost fell backwards on romberg, but no hand asymmetry, otherwise CN2-12 intact, strength normal upper extremities and lower extremities, DTRs 2+ throughout Psychiatric: normal affect, behavior normal, pleasant      Adult ECG Report  Indication: facial numbness  Rate: 73 bpm  Rhythm: normal sinus rhythm  QRS Axis: 45 degrees  PR Interval: 138ms  QRS Duration: 96ms  QTc: 436ms  Conduction Disturbances: none  Other Abnormalities: none  Patient's cardiac risk factors are: male gender, obesity (BMI >= 30 kg/m2), sedentary lifestyle, smoking/ tobacco exposure and borderline diabetes and borderline HTN.  EKG comparison: 2015  Narrative Interpretation: no acute change    Assessment:   Encounter Diagnoses  Name Primary?  . Facial numbness Yes  . Dizziness and giddiness   .  Numbness and tingling of right arm and leg   . COPD (chronic obstructive pulmonary disease) with chronic bronchitis   . Smoker    . Morbid obesity   . Fecal incontinence   . Concentration deficit   . OSA on CPAP   . Impaired fasting blood sugar       Plan:   Given his findings and risk factors for cardiovascular disease, I advised he be seen in the ED to eval for possible TIA/Stroke vs ruling out other process.   He would not go by personal vehicle despite the risks.  He did not have any friends or family close to come get him.  Reluctantly he will go by personal vehicle immediately to [facility] emergency dept.      He has some abnormalities on neuro exam today, need to rule out TIA/CVA.  He is compliant with CPAP, recently started on Sybmicort for COPD which is helping but he continues to smoke.   He has impaired glucose, elevated BP today mildly.    [facility] ED triage and they are aware he is coming.

## 2015-06-02 NOTE — ED Notes (Signed)
Pt stated he was leaving and wasn't going to stay he stated he "didnt have the patience to sit here this long." Attempted to get pt to stay but refused.

## 2015-06-02 NOTE — ED Notes (Signed)
Pt in c/o right sided facial and arm numbness, pt feels like his right arm is weaker than normal, pt states he has some deficits after neck surgery but this feels worse, right grip weaker than left, no droop noted to face, pt states symptoms started yesterday, alert and oriented, no distress noted

## 2015-06-16 NOTE — Addendum Note (Signed)
Addended by: Jac Canavan on: 06/16/2015 09:53 AM   Modules accepted: Orders

## 2015-08-23 ENCOUNTER — Other Ambulatory Visit: Payer: Self-pay

## 2015-08-23 ENCOUNTER — Emergency Department (HOSPITAL_COMMUNITY): Payer: 59

## 2015-08-23 ENCOUNTER — Emergency Department (HOSPITAL_COMMUNITY)
Admission: EM | Admit: 2015-08-23 | Discharge: 2015-08-24 | Disposition: A | Discharge status: Home / Self Care | Payer: 59 | Attending: Emergency Medicine | Admitting: Emergency Medicine

## 2015-08-23 ENCOUNTER — Encounter (HOSPITAL_COMMUNITY): Payer: Self-pay

## 2015-08-23 DIAGNOSIS — G510 Bell's palsy: Secondary | ICD-10-CM | POA: Insufficient documentation

## 2015-08-23 DIAGNOSIS — M542 Cervicalgia: Secondary | ICD-10-CM | POA: Diagnosis not present

## 2015-08-23 DIAGNOSIS — Z79899 Other long term (current) drug therapy: Secondary | ICD-10-CM | POA: Diagnosis not present

## 2015-08-23 DIAGNOSIS — F329 Major depressive disorder, single episode, unspecified: Secondary | ICD-10-CM | POA: Insufficient documentation

## 2015-08-23 DIAGNOSIS — R51 Headache: Secondary | ICD-10-CM | POA: Diagnosis present

## 2015-08-23 DIAGNOSIS — Z720 Tobacco use: Secondary | ICD-10-CM | POA: Insufficient documentation

## 2015-08-23 HISTORY — DX: Bell's palsy: G51.0

## 2015-08-23 LAB — DIFFERENTIAL
Basophils Absolute: 0 10*3/uL (ref 0.0–0.1)
Basophils Relative: 0 % (ref 0–1)
EOS ABS: 0.2 10*3/uL (ref 0.0–0.7)
EOS PCT: 2 % (ref 0–5)
LYMPHS ABS: 2.1 10*3/uL (ref 0.7–4.0)
LYMPHS PCT: 31 % (ref 12–46)
MONO ABS: 0.7 10*3/uL (ref 0.1–1.0)
MONOS PCT: 11 % (ref 3–12)
NEUTROS PCT: 56 % (ref 43–77)
Neutro Abs: 3.7 10*3/uL (ref 1.7–7.7)

## 2015-08-23 LAB — COMPREHENSIVE METABOLIC PANEL
ALBUMIN: 3.8 g/dL (ref 3.5–5.0)
ALK PHOS: 64 U/L (ref 38–126)
ALT: 54 U/L (ref 17–63)
ANION GAP: 8 (ref 5–15)
AST: 33 U/L (ref 15–41)
BILIRUBIN TOTAL: 0.5 mg/dL (ref 0.3–1.2)
BUN: 10 mg/dL (ref 6–20)
CALCIUM: 8.7 mg/dL — AB (ref 8.9–10.3)
CO2: 27 mmol/L (ref 22–32)
Chloride: 103 mmol/L (ref 101–111)
Creatinine, Ser: 1.15 mg/dL (ref 0.61–1.24)
GFR calc Af Amer: >60 mL/min (ref >60)
GLUCOSE: 139 mg/dL — AB (ref 65–99)
POTASSIUM: 3.5 mmol/L (ref 3.5–5.1)
Sodium: 138 mmol/L (ref 135–145)
TOTAL PROTEIN: 6.4 g/dL — AB (ref 6.5–8.1)

## 2015-08-23 LAB — CBC
HEMATOCRIT: 44.9 % (ref 39.0–52.0)
HEMOGLOBIN: 15.7 g/dL (ref 13.0–17.0)
MCH: 32 pg (ref 26.0–34.0)
MCHC: 35 g/dL (ref 30.0–36.0)
MCV: 91.4 fL (ref 78.0–100.0)
Platelets: 220 10*3/uL (ref 150–400)
RBC: 4.91 MIL/uL (ref 4.22–5.81)
RDW: 11.4 % — ABNORMAL LOW (ref 11.5–15.5)
WBC: 6.6 10*3/uL (ref 4.0–10.5)

## 2015-08-23 LAB — PROTIME-INR
INR: 1.18 (ref 0.00–1.49)
Prothrombin Time: 15.1 seconds (ref 11.6–15.2)

## 2015-08-23 LAB — CBG MONITORING, ED: GLUCOSE-CAPILLARY: 131 mg/dL — AB (ref 65–99)

## 2015-08-23 LAB — APTT: aPTT: 30 seconds (ref 24–37)

## 2015-08-23 MED ORDER — SODIUM CHLORIDE 0.9 % IV BOLUS (SEPSIS)
1000.0000 mL | Freq: Once | INTRAVENOUS | Status: AC
  Administered 2015-08-24: 1000 mL via INTRAVENOUS

## 2015-08-23 MED ORDER — KETOROLAC TROMETHAMINE 30 MG/ML IJ SOLN
30.0000 mg | Freq: Once | INTRAMUSCULAR | Status: AC
  Administered 2015-08-24: 30 mg via INTRAVENOUS
  Filled 2015-08-23: qty 1

## 2015-08-23 MED ORDER — METHYLPREDNISOLONE SODIUM SUCC 125 MG IJ SOLR
125.0000 mg | Freq: Once | INTRAMUSCULAR | Status: AC
  Administered 2015-08-24: 125 mg via INTRAVENOUS
  Filled 2015-08-23: qty 2

## 2015-08-23 MED ORDER — METOCLOPRAMIDE HCL 5 MG/ML IJ SOLN
10.0000 mg | Freq: Once | INTRAMUSCULAR | Status: AC
  Administered 2015-08-24: 10 mg via INTRAVENOUS
  Filled 2015-08-23: qty 2

## 2015-08-23 NOTE — ED Provider Notes (Signed)
CSN: [id number]     Arrival date & time 08/23/15  1730 History   This chart was scribed for Loren Racer, MD by Arlan Organ, ED Scribe. This patient was seen in room B18C/B18C and the patient's care was started 11:19 PM.   Chief Complaint  Patient presents with  . Facial Droop  . Headache   The history is provided by the patient. No language interpreter was used.    HPI Comments: Andre [name] is a 34 y.o. male with a PMHx of Bell's Palsy who presents to the Emergency Department complaining of constant, ongoing L sided HA x 2 days. Pt also reports L sided neck pain, weakness and numbness to the L side which has gradually worsened overtime. He has also noted L sided facial droop, slurred speech, loss of taste, and numbness to the tongue. No recent fever or chills. Andre [name] admits to a history of Bell's Palsy as a child. No extremity weakness or numbness.  Past Medical History  Diagnosis Date  . Depression   . Bell's palsy     at 6th or 7th grade on right side    Past Surgical History  Procedure Laterality Date  . Cervical fusion    . Skin graft Right     below right knee, medial    History reviewed. No pertinent family history. Social History  Substance Use Topics  . Smoking status: Current Every Day Smoker -- 2.00 packs/day    Types: Cigarettes  . Smokeless tobacco: Never Used  . Alcohol Use: No    Review of Systems  Constitutional: Negative for fever and chills.  HENT: Negative for facial swelling, sore throat and trouble swallowing.   Eyes: Positive for redness. Negative for visual disturbance.  Respiratory: Negative for cough and shortness of breath.   Cardiovascular: Negative for chest pain.  Gastrointestinal: Negative for nausea, vomiting and abdominal pain.  Musculoskeletal: Positive for myalgias and neck pain. Negative for back pain and neck stiffness.  Skin: Negative for rash and wound.  Neurological: Positive for weakness, numbness and headaches.  Negative for dizziness, syncope and light-headedness.  Psychiatric/Behavioral: Negative for confusion.  All other systems reviewed and are negative.     Allergies  Review of patient's allergies indicates no known allergies.  Home Medications   Prior to Admission medications   Medication Sig Start Date End Date Taking? Authorizing Provider  acyclovir (ZOVIRAX) 400 MG tablet Take 1 tablet (400 mg total) by mouth 5 (five) times daily. 08/24/15 09/02/15  Loren Racer, MD  albuterol (PROVENTIL HFA;VENTOLIN HFA) 108 (90 BASE) MCG/ACT inhaler Inhale 2 puffs into the lungs every 6 (six) hours as needed for wheezing or shortness of breath. 03/24/15   Kermit Balo Tysinger, PA-C  budesonide-formoterol (SYMBICORT) 160-4.5 MCG/ACT inhaler Inhale 2 puffs into the lungs 2 (two) times daily. 03/24/15   Kermit Balo Tysinger, PA-C  buPROPion (WELLBUTRIN XL) 150 MG 24 hr tablet Take 1 tablet (150 mg total) by mouth daily. 03/23/15   Kermit Balo Tysinger, PA-C  predniSONE (DELTASONE) 20 MG tablet Take 3 tabs for 6 days, then take 2 tabs for 2 days and then 1 tab for 2 days 08/24/15   Loren Racer, MD   Triage Vitals: BP 157/82 mmHg  Pulse 57  Temp(Src) 98.3 F (36.8 C) (Oral)  Resp 15  Ht 6' 3.5" (1.918 m)  Wt 364 lb 12.8 oz (165.472 kg)  BMI 44.98 kg/m2  SpO2 98%   Physical Exam  Constitutional: He is oriented to person, place,  and time. He appears well-developed and well-nourished. No distress.  HENT:  Head: Normocephalic and atraumatic.  Mouth/Throat: Oropharynx is clear and moist.  Eyes: EOM are normal. Pupils are equal, round, and reactive to light.  Neck: Normal range of motion. Neck supple.  Patient with no meningismus. He does have tenderness to palpation along the cervical paraspinal muscles on the left. There is no midline tenderness.  Cardiovascular: Normal rate and regular rhythm.   Pulmonary/Chest: Effort normal and breath sounds normal. No respiratory distress. He has no wheezes. He has no rales.   Abdominal: Soft. Bowel sounds are normal.  Musculoskeletal: Normal range of motion. He exhibits no edema or tenderness.  Neurological: He is alert and oriented to person, place, and time.  Patient is alert and oriented x3 with clear, goal oriented speech. Patient has 5/5 motor in all extremities. Bilateral finger-to-nose is normal with no signs of dysmetria. Patient has a normal gait and walks without assistance.  Patient has weakness to the upper and lower left side of the face. There is left-sided facial droop. Patient has difficulty completely closing the left eye. There is no deformity weakness. Patient also has decreased sensation to the left forehead and lower face.  Skin: Skin is warm and dry. No rash noted. No erythema.  Psychiatric: He has a normal mood and affect. His behavior is normal.  Nursing note and vitals reviewed.   ED Course  Procedures (including critical care time)  DIAGNOSTIC STUDIES: Oxygen Saturation is 98% on RA, Normal by my interpretation.    COORDINATION OF CARE: 12:52 AM-Discussed treatment plan with pt at bedside and pt agreed to plan.     Labs Review Labs Reviewed  CBC - Abnormal; Notable for the following:    RDW 11.4 (*)    All other components within normal limits  COMPREHENSIVE METABOLIC PANEL - Abnormal; Notable for the following:    Glucose, Bld 139 (*)    Calcium 8.7 (*)    Total Protein 6.4 (*)    All other components within normal limits  CBG MONITORING, ED - Abnormal; Notable for the following:    Glucose-Capillary 131 (*)    All other components within normal limits  PROTIME-INR  APTT  DIFFERENTIAL  I-STAT TROPOININ, ED  I-STAT CHEM 8, ED    Imaging Review Ct Head Wo Contrast  08/23/2015   CLINICAL DATA:  Acute onset of left-sided facial droop for 2 days. Headache and left-sided weakness. Initial encounter.  EXAM: CT HEAD WITHOUT CONTRAST  TECHNIQUE: Contiguous axial images were obtained from the base of the skull through the  vertex without intravenous contrast.  COMPARISON:  CT of the head performed 06/02/2015  FINDINGS: There is no evidence of acute infarction, mass lesion, or intra- or extra-axial hemorrhage on CT.  The posterior fossa, including the cerebellum, brainstem and fourth ventricle, is within normal limits. The third and lateral ventricles, and basal ganglia are unremarkable in appearance. The cerebral hemispheres are symmetric in appearance, with normal gray-white differentiation. No mass effect or midline shift is seen.  There is no evidence of fracture; visualized osseous structures are unremarkable in appearance. The orbits are within normal limits. The paranasal sinuses and mastoid air cells are well-aerated. No significant soft tissue abnormalities are seen.  IMPRESSION: Unremarkable noncontrast CT of the head.   Electronically Signed   By: Roanna Raider M.D.   On: 08/23/2015 22:39   I have personally reviewed and evaluated these images and lab results as part of my medical decision-making.  EKG Interpretation   Date/Time:  Sunday August 23 2015 21:42:39 EDT Ventricular Rate:  66 PR Interval:  127 QRS Duration: 107 QT Interval:  406 QTC Calculation: 425 R Axis:   94 Text Interpretation:  Age not entered, assumed to be  34 years old for  purpose of ECG interpretation Sinus rhythm Borderline right axis deviation  Confirmed by Lincoln Brigham [phone]) on 08/23/2015 10:20:33 PM Also confirmed by  Ranae Palms  MD, Ralyn Stlaurent ([id number])  on 08/23/2015 11:04:18 PM      MDM   Final diagnoses:  Bell's palsy    I personally performed the services described in this documentation, which was scribed in my presence. The recorded information has been reviewed and is accurate.  Presentation is consistent with Bell's palsy. Patient has both upper and lower facial weakness and numbness. CT head is normal. Laboratory workup is normal. Will start patient on steroids and have instructed patient to follow-up with neurology.  He's been given return precautions and is voiced understanding.    Loren Racer, MD 08/24/15 [phone]

## 2015-08-23 NOTE — ED Notes (Signed)
Pt had brief period of facial switching in triage.  Rn made aware and moved back to room.

## 2015-08-23 NOTE — ED Notes (Signed)
Dr. Madilyn Hook notified of patient's presentation, NIH 6. See new orders.

## 2015-08-23 NOTE — ED Notes (Signed)
Onset 08-20-15 pt started get left sided neck pain and left sided headache, symptoms worsened on 08-21-15, 07-2715 left sided facial droop and numbness, slurred speech, tongue is numb, unable to taste foods.

## 2015-08-23 NOTE — ED Notes (Signed)
  CBG 131  

## 2015-08-24 MED ORDER — PREDNISONE 20 MG PO TABS: ORAL_TABLET | ORAL | Status: DC

## 2015-08-24 MED ORDER — ACYCLOVIR 400 MG PO TABS: 400.0000 mg | ORAL_TABLET | Freq: Every day | ORAL | Status: AC

## 2015-08-24 NOTE — ED Notes (Signed)
Pt verbalized understanding of d/c instructions and has no further questions. Pt stable and NAD. Pt educated on prescriptions to treat bell's palsy.

## 2015-08-24 NOTE — Discharge Instructions (Signed)
Bell's Palsy °Bell's palsy is a condition in which the muscles on one side of the face cannot move (paralysis). This is because the nerves in the face are paralyzed. It is most often thought to be caused by a virus. The virus causes swelling of the nerve that controls movement on one side of the face. The nerve travels through a tight space surrounded by bone. When the nerve swells, it can be compressed by the bone. This results in damage to the protective covering around the nerve. This damage interferes with how the nerve communicates with the muscles of the face. As a result, it can cause weakness or paralysis of the facial muscles.  °Injury (trauma), tumor, and surgery may cause Bell's palsy, but most of the time the cause is unknown. It is a relatively common condition. It starts suddenly (abrupt onset) with the paralysis usually ending within 2 days. Bell's palsy is not dangerous. But because the eye does not close properly, you may need care to keep the eye from getting dry. This can include splinting (to keep the eye shut) or moistening with artificial tears. Bell's palsy very seldom occurs on both sides of the face at the same time. °SYMPTOMS  °· Eyebrow sagging. °· Drooping of the eyelid and corner of the mouth. °· Inability to close one eye. °· Loss of taste on the front of the tongue. °· Sensitivity to loud noises. °TREATMENT  °The treatment is usually non-surgical. If the patient is seen within the first 24 to 48 hours, a short course of steroids may be prescribed, in an attempt to shorten the length of the condition. Antiviral medicines may also be used with the steroids, but it is unclear if they are helpful.  °You will need to protect your eye, if you cannot close it. The cornea (clear covering over your eye) will become dry and can be damaged. Artificial tears can be used to keep your eye moist. Glasses or an eye patch should be worn to protect your eye. °PROGNOSIS  °Recovery is variable, ranging  from days to months. Although the problem usually goes away completely (about 80% of cases resolve), predicting the outcome is impossible. Most people improve within 3 weeks of when the symptoms began. Improvement may continue for 3 to 6 months. A small number of people have moderate to severe weakness that is permanent.  °HOME CARE INSTRUCTIONS  °· If your caregiver prescribed medication to reduce swelling in the nerve, use as directed. Do not stop taking the medication unless directed by your caregiver. °· Use moisturizing eye drops as needed to prevent drying of your eye, as directed by your caregiver. °· Protect your eye, as directed by your caregiver. °· Use facial massage and exercises, as directed by your caregiver. °· Perform your normal activities, and get your normal rest. °SEEK IMMEDIATE MEDICAL CARE IF:  °· There is pain, redness or irritation in the eye. °· You or your child has an oral temperature above 102° F (38.9° C), not controlled by medicine. °MAKE SURE YOU:  °· Understand these instructions. °· Will watch your condition. °· Will get help right away if you are not doing well or get worse. °Document Released: 12/12/2005 Document Revised: 03/05/2012 Document Reviewed: 03/21/2014 °Exit[employer]® Patient Information ©2015 [employer]. This information is not intended to replace advice given to you by your health care provider. Make sure you discuss any questions you have with your health care provider. ° °

## 2015-09-01 ENCOUNTER — Ambulatory Visit (INDEPENDENT_AMBULATORY_CARE_PROVIDER_SITE_OTHER): Payer: 59 | Admitting: Neurology

## 2015-09-01 ENCOUNTER — Encounter: Payer: Self-pay | Admitting: Neurology

## 2015-09-01 VITALS — BP 148/93 | HR 72 | Ht 75.5 in | Wt 364.5 lb

## 2015-09-01 DIAGNOSIS — R61 Generalized hyperhidrosis: Secondary | ICD-10-CM | POA: Insufficient documentation

## 2015-09-01 DIAGNOSIS — R278 Other lack of coordination: Secondary | ICD-10-CM | POA: Insufficient documentation

## 2015-09-01 DIAGNOSIS — G510 Bell's palsy: Secondary | ICD-10-CM | POA: Diagnosis not present

## 2015-09-01 DIAGNOSIS — R682 Dry mouth, unspecified: Secondary | ICD-10-CM | POA: Insufficient documentation

## 2015-09-01 DIAGNOSIS — H04122 Dry eye syndrome of left lacrimal gland: Secondary | ICD-10-CM | POA: Insufficient documentation

## 2015-09-01 NOTE — Progress Notes (Signed)
[facility]   Provider:  Melvyn Novas, M D  Referring Provider: Ronnald Nian, MD Primary Care Physician:  Carollee Herter, MD  Chief Complaint  Patient presents with  . New Patient (Initial Visit)    Bell's Palsy; L side of face is numb    HPI:  Andre [name] is a 34 y.o. male , seen here as a referral from Dr. Susann Givens for facial numbness and weakness, thought to reflect Bells palsy   Chief complaint according to patient : "My face became weak and numb on August 26."  The patient reports that he first seek medical attention on 06-02-15 when he felt tingling in his upper lip mostly on the left side of the face and a feeling of tingling numbness in the face. After waiting multiple hours in the emergency room's waiting area he left without being seen. Andre [name] reports that he had presented  to his primary care physician. He was seen again in the  [facility] on 08-23-15 by Dr. Ranae Palms, whose report is available to me here today.  Constant left-sided headache for 2 days at the time of presentation,  left-sided neck pain, weakness and numbness to the left side of the face which has gradually worsened after initial onset on August 26. He also reported that his speech was slightly slurred. He reported numbness to the tongue and loss of taste. He states his left  ear is "beeping ", bothering him and he  can hardly  understand what people say. He has hyperacusis for high frequencies. He cannot close his left eye. His left eye is slightly inlet irritated and also his right eye has a ciliary injection. He reports further that he feels weakness in his hands and extremities all over feeling heavy.  Andre [name] reports that he was sitting for about 5 hours at the emergency rooms waiting area and was told he suffered a seizure. He has of course no recollection of it but he woke he was on a stretcher in the emergency room. Please note that I have no documentation of this so-called  seizure from the emergency room physician.   He reports that he feels sore and achy all over. All this started as his facial numbness started. He reports fever elevated temperature the feeling of chills. Myalgias and neck pain. He reports headaches. Interestingly, Andre [name] reports that he had Bell's palsy as a child but it affected his right face at that time he was age 78.  The patient is status post anterior cervical fusion in the year 2011, prior to his surgery he had lost some grip strength in his right hand.  Social history:  Designer, fashion/clothing, drives new cars.  Smoker 2 ppd, no ETOH.   Review of Systems: Out of a complete 14 system review, the patient complains of only the following symptoms, and all other reviewed systems are negative. Tinnitus, hyperacusis, unable to close left eye.      Social History   Social History  . Marital Status: Married    Spouse Name: N/A  . Number of Children: 1  . Years of Education: 12th   Occupational History  .  Other    [employer] and [employer]   Social History Main Topics  . Smoking status: Current Every Day Smoker -- 2.00 packs/day    Types: Cigarettes  . Smokeless tobacco: Never Used  . Alcohol Use: No  . Drug Use: No  . Sexual Activity: Yes   Other Topics Concern  .  Not on file   Social History Narrative   ** Merged History Encounter **   Patient lives at home alone.   Caffeine Use: 2 drinks daily         History reviewed. No pertinent family history.  Past Medical History  Diagnosis Date  . Depression   . Bell's palsy     at 6th or 7th grade on right side     Past Surgical History  Procedure Laterality Date  . Cervical fusion    . Skin graft Right     below right knee, medial     Current Outpatient Prescriptions  Medication Sig Dispense Refill  . acyclovir (ZOVIRAX) 400 MG tablet Take 1 tablet (400 mg total) by mouth 5 (five) times daily. 50 tablet 0  . albuterol (PROVENTIL HFA;VENTOLIN HFA) 108 (90 BASE) MCG/ACT  inhaler Inhale 2 puffs into the lungs every 6 (six) hours as needed for wheezing or shortness of breath. 1 Inhaler 0  . budesonide-formoterol (SYMBICORT) 160-4.5 MCG/ACT inhaler Inhale 2 puffs into the lungs 2 (two) times daily. 1 Inhaler 2   Current Facility-Administered Medications  Medication Dose Route Frequency Provider Last Rate Last Dose  . albuterol (PROVENTIL) (2.5 MG/3ML) 0.083% nebulizer solution 2.5 mg  2.5 mg Nebulization Once Jac Canavan, PA-C        Allergies as of 09/01/2015  . (No Known Allergies)    Vitals: BP 148/93 mmHg  Pulse 72  Ht 6' 3.5" (1.918 m)  Wt 364 lb 8 oz (165.336 kg)  BMI 44.94 kg/m2 Last Weight:  Wt Readings from Last 1 Encounters:  09/01/15 364 lb 8 oz (165.336 kg)   ZOX:WRUE mass index is 44.94 kg/(m^2).     Last Height:   Ht Readings from Last 1 Encounters:  09/01/15 6' 3.5" (1.918 m)    Physical exam:  General: The patient is awake, alert and appears not in acute distress. The patient is well groomed. Head: Normocephalic, atraumatic. Neck is supple. Mallampati 4,  neck circumference:18, Retrognathia is not seen.  Cardiovascular:  Regular rate and rhythm , without  murmurs or carotid bruit, and without distended neck veins. Respiratory: Lungs are clear to auscultation. Skin:  Without evidence of edema, or rash Trunk: obese  Neurologic exam : The patient is awake and alert, oriented to place and time.       Attention span & concentration ability appears normal.  Speech is fluent,  With dysarthria, but not dysphonia or aphasia.  Mood and affect are appropriate.  Cranial nerves: Pupils are equal and briskly reactive to light. Funduscopic exam without evidence of pallor or edema. Extraocular movements  in vertical and horizontal planes intact and without nystagmus. Visual fields by finger perimetry are intact. Hearing to finger rub intact.   Facial sensation intact to fine touch.  Facial motor strength is symmetric and tongue and  uvula move midline. Shoulder shrug was symmetrical.   Motor exam:   Normal tone, muscle bulk and symmetric strength in all extremities.  Sensory:  Fine touch, pinprick and vibration were tested in all extremities. Proprioception tested in the upper extremities was normal.  Coordination: Rapid alternating movements in the fingers/hands was normal. Finger-to-nose maneuver with evidence of dysmetria in the left.  Gait and station: Patient walks without assistive device and is able unassisted to climb up to the exam table. Strength within normal limits.  Stance is stable and normal.  Toe and hell stand were intact, the patient can walk on his tiptoes and he  seems slightly weaker on the right side. He can turn with 3 steps, has normal arm swing and does not drift.   Deep tendon reflexes: in the  upper and lower extremities are symmetric and intact. Babinski maneuver response is downgoing. CLINICAL DATA: Acute onset of left-sided facial droop for 2 days. Headache and left-sided weakness. Initial encounter.  EXAM: CT HEAD WITHOUT CONTRAST  TECHNIQUE: Contiguous axial images were obtained from the base of the skull through the vertex without intravenous contrast.  COMPARISON: CT of the head performed 06/02/2015  FINDINGS: There is no evidence of acute infarction, mass lesion, or intra- or extra-axial hemorrhage on CT.  The posterior fossa, including the cerebellum, brainstem and fourth ventricle, is within normal limits. The third and lateral ventricles, and basal ganglia are unremarkable in appearance. The cerebral hemispheres are symmetric in appearance, with normal gray-white differentiation. No mass effect or midline shift is seen.  There is no evidence of fracture; visualized osseous structures are unremarkable in appearance. The orbits are within normal limits. The paranasal sinuses and mastoid air cells are well-aerated. No significant soft tissue abnormalities are  seen.  IMPRESSION: Unremarkable noncontrast CT of the head.   Electronically Signed  By: Roanna Raider M.D.  On: 08/23/2015 22:39 The patient was advised of the nature of the diagnosed  disorder , the treatment options and risks for general a health and wellness arising from not treating the condition.  I spent more than 40 minutes of face to face time with the patient. Greater than 50% of time was spent in counseling and coordination of care. We have discussed the diagnosis and differential and I answered the patient's questions.     Assessment:  After physical and neurologic examination, review of laboratory studies,  Personal review of imaging studies, reports of other /same  Imaging studies , which I personally reviewed and presented to the patient. His CT of the head looks normal. ,  Results ofpre-existing records as far as provided in visit., my assessment is   1) Andre [name] does have a Bell's palsy. This is the second time he suffers from one of the first time would have been well over 20 years ago. Unusual as the slow onset first having symptoms of numbness and tingling in the upper lip and left face in early June and then developing a true weakness and droopiness of the left face involving all 3 branches of the facial area in late August. He finished a sterile Dosepak prescribed by the emergency room physician and he had been and had been given an anti-viral medication. He was given Deltasone 20 minute on tablets, he is also taking Proventil and Symbicort inhaler and he was given Zovirax 400 mg tablets 5 times daily.  2) he reports now having night sweats, being diaphoretic there is a report of a possible seizure in the waiting room of the emergency room,  but no documentation on his ED medical notes. He feels sore and achy all over he had dysmetria on the left hand, and he needs a protective ointment for his left eye. I will run a viral panel. Also muscle enzymes,  sedimentation rate, and if the patient feels that he continues to progress and not improve I would consider a spinal tap.  3) Rv in 14 -28 days. - With MD     Plan:  Treatment plan and additional workup :  Check for Sarcoid, Lyme, [place].  MRI brain, evaluate for possible seizure source.  Refill zovirax  for 3 more days , get CMET to check on renal function.  He needs to hydrate well , this will help renal function.Porfirio Mylar Eular Panek MD  09/01/2015   CC: Ronnald Nian, Md [address] [place], [place] [address]

## 2015-09-01 NOTE — Patient Instructions (Signed)
Bell's Palsy °Bell's palsy is a condition in which the muscles on one side of the face cannot move (paralysis). This is because the nerves in the face are paralyzed. It is most often thought to be caused by a virus. The virus causes swelling of the nerve that controls movement on one side of the face. The nerve travels through a tight space surrounded by bone. When the nerve swells, it can be compressed by the bone. This results in damage to the protective covering around the nerve. This damage interferes with how the nerve communicates with the muscles of the face. As a result, it can cause weakness or paralysis of the facial muscles.  °Injury (trauma), tumor, and surgery may cause Bell's palsy, but most of the time the cause is unknown. It is a relatively common condition. It starts suddenly (abrupt onset) with the paralysis usually ending within 2 days. Bell's palsy is not dangerous. But because the eye does not close properly, you may need care to keep the eye from getting dry. This can include splinting (to keep the eye shut) or moistening with artificial tears. Bell's palsy very seldom occurs on both sides of the face at the same time. °SYMPTOMS  °· Eyebrow sagging. °· Drooping of the eyelid and corner of the mouth. °· Inability to close one eye. °· Loss of taste on the front of the tongue. °· Sensitivity to loud noises. °TREATMENT  °The treatment is usually non-surgical. If the patient is seen within the first 24 to 48 hours, a short course of steroids may be prescribed, in an attempt to shorten the length of the condition. Antiviral medicines may also be used with the steroids, but it is unclear if they are helpful.  °You will need to protect your eye, if you cannot close it. The cornea (clear covering over your eye) will become dry and can be damaged. Artificial tears can be used to keep your eye moist. Glasses or an eye patch should be worn to protect your eye. °PROGNOSIS  °Recovery is variable, ranging  from days to months. Although the problem usually goes away completely (about 80% of cases resolve), predicting the outcome is impossible. Most people improve within 3 weeks of when the symptoms began. Improvement may continue for 3 to 6 months. A small number of people have moderate to severe weakness that is permanent.  °HOME CARE INSTRUCTIONS  °· If your caregiver prescribed medication to reduce swelling in the nerve, use as directed. Do not stop taking the medication unless directed by your caregiver. °· Use moisturizing eye drops as needed to prevent drying of your eye, as directed by your caregiver. °· Protect your eye, as directed by your caregiver. °· Use facial massage and exercises, as directed by your caregiver. °· Perform your normal activities, and get your normal rest. °SEEK IMMEDIATE MEDICAL CARE IF:  °· There is pain, redness or irritation in the eye. °· You or your child has an oral temperature above 102° F (38.9° C), not controlled by medicine. °MAKE SURE YOU:  °· Understand these instructions. °· Will watch your condition. °· Will get help right away if you are not doing well or get worse. °Document Released: 12/12/2005 Document Revised: 03/05/2012 Document Reviewed: 03/21/2014 °Exit[employer]® Patient Information ©2015 [employer]. This information is not intended to replace advice given to you by your health care provider. Make sure you discuss any questions you have with your health care provider. ° °

## 2015-09-02 ENCOUNTER — Telehealth: Payer: Self-pay | Admitting: Family Medicine

## 2015-09-02 ENCOUNTER — Ambulatory Visit (INDEPENDENT_AMBULATORY_CARE_PROVIDER_SITE_OTHER): Payer: 59 | Admitting: Medical

## 2015-09-02 ENCOUNTER — Encounter: Payer: Self-pay | Admitting: Medical

## 2015-09-02 VITALS — BP 148/100 | HR 72 | Temp 98.1°F | Resp 16 | Wt 360.8 lb

## 2015-09-02 DIAGNOSIS — Z720 Tobacco use: Secondary | ICD-10-CM | POA: Diagnosis not present

## 2015-09-02 DIAGNOSIS — J449 Chronic obstructive pulmonary disease, unspecified: Secondary | ICD-10-CM

## 2015-09-02 DIAGNOSIS — R7301 Impaired fasting glucose: Secondary | ICD-10-CM

## 2015-09-02 DIAGNOSIS — M545 Low back pain, unspecified: Secondary | ICD-10-CM

## 2015-09-02 DIAGNOSIS — R1030 Lower abdominal pain, unspecified: Secondary | ICD-10-CM

## 2015-09-02 DIAGNOSIS — G510 Bell's palsy: Secondary | ICD-10-CM | POA: Diagnosis not present

## 2015-09-02 DIAGNOSIS — I10 Essential (primary) hypertension: Secondary | ICD-10-CM | POA: Diagnosis not present

## 2015-09-02 DIAGNOSIS — F17200 Nicotine dependence, unspecified, uncomplicated: Secondary | ICD-10-CM

## 2015-09-02 LAB — SEDIMENTATION RATE: Sed Rate: 3 mm/hr (ref 0–15)

## 2015-09-02 LAB — COMPREHENSIVE METABOLIC PANEL
A/G RATIO: 2 (ref 1.1–2.5)
ALT: 70 IU/L — AB (ref 0–44)
AST: 31 IU/L (ref 0–40)
Albumin: 4.1 g/dL (ref 3.5–5.5)
Alkaline Phosphatase: 84 IU/L (ref 39–117)
BILIRUBIN TOTAL: 0.4 mg/dL (ref 0.0–1.2)
BUN/Creatinine Ratio: 16 (ref 8–19)
BUN: 17 mg/dL (ref 6–20)
CHLORIDE: 96 mmol/L — AB (ref 97–108)
CO2: 24 mmol/L (ref 18–29)
Calcium: 8.9 mg/dL (ref 8.7–10.2)
Creatinine, Ser: 1.09 mg/dL (ref 0.76–1.27)
GFR calc non Af Amer: 88 mL/min/{1.73_m2} (ref >59)
GFR, EST AFRICAN AMERICAN: 102 mL/min/{1.73_m2} (ref >59)
GLOBULIN, TOTAL: 2.1 g/dL (ref 1.5–4.5)
Glucose: 233 mg/dL — ABNORMAL HIGH (ref 65–99)
POTASSIUM: 3.8 mmol/L (ref 3.5–5.2)
SODIUM: 136 mmol/L (ref 134–144)
TOTAL PROTEIN: 6.2 g/dL (ref 6.0–8.5)

## 2015-09-02 LAB — ANGIOTENSIN CONVERTING ENZYME: Angio Convert Enzyme: 33 U/L (ref 14–82)

## 2015-09-02 LAB — CK TOTAL AND CKMB (NOT AT ARMC)
CK MB INDEX: 2.3 ng/mL (ref 0.0–10.4)
Total CK: 54 U/L (ref 24–204)

## 2015-09-02 LAB — SJOGREN'S SYNDROME ANTIBODS(SSA + SSB)
ENA SSA (RO) Ab: <0.2 AI (ref 0.0–0.9)
ENA SSB (LA) Ab: <0.2 AI (ref 0.0–0.9)

## 2015-09-02 MED ORDER — ONDANSETRON HCL 4 MG PO TABS: 4.0000 mg | ORAL_TABLET | Freq: Three times a day (TID) | ORAL | Status: DC | PRN

## 2015-09-02 MED ORDER — BUDESONIDE-FORMOTEROL FUMARATE 160-4.5 MCG/ACT IN AERO: 2.0000 | INHALATION_SPRAY | Freq: Two times a day (BID) | RESPIRATORY_TRACT | Status: DC

## 2015-09-02 MED ORDER — LOSARTAN POTASSIUM 25 MG PO TABS: 25.0000 mg | ORAL_TABLET | Freq: Every day | ORAL | Status: DC

## 2015-09-02 MED ORDER — IBUPROFEN 800 MG PO TABS: 800.0000 mg | ORAL_TABLET | Freq: Three times a day (TID) | ORAL | Status: DC | PRN

## 2015-09-02 NOTE — Progress Notes (Signed)
Subjective: Here for f/u from ED visit.  When I last saw him in June, I had sent him to the emergency dept.   He was treated and released and then just went back to the ED this past week for facial weakness and a variety of symptoms.   He saw neurology yesterday for consult since his ED visit recently.  Was diagnosed with bells palsy.   He has several symptoms he is concerned about.   Today he reports over a week hx/o pain in the left face, even with light touch.  Not sleeping well due to the pain, but also reports pains in his joints, back pain, mid bilat abdominal pain.   Has some nausea.   Denies vomiting, diarrhea, constipation.  No urinary c/o.  He is still taking the acyclovir, has finished the prednisone steroid.  Using OTC analgesic for pain.  Neurology ordered tests on him yesterday and some are reportedly pending.   He is using his Symbicort some times, not daily.   Currently not using CPAP since he can' operate the mask with the facial weakness from Bell's Palsy.  No other aggravating or relieving factors. No other complaint.   Past Medical History  Diagnosis Date  . Depression   . Bell's palsy     at 6th or 7th grade on right side    ROS as in subjective   Objective: BP 148/100 mmHg  Pulse 72  Temp(Src) 98.1 F (36.7 C)  Resp 16  Wt 360 lb 12.8 oz (163.658 kg)  Gen: obese white male, nad Skin dull to sensation over left face Neck supple, nontender, normal ROM Heart rrr, normal s1, s2, no murmurs Lungs clear Abdomen: mild generalized tenderness, no mass, no organomegaly, +bs Back: mild lumbar tender, mild pain with ROM which is full Ext: no edema Pulses normal Obvious left facial weakness    Assessment: Encounter Diagnoses  Name Primary?  . Bell's palsy Yes  . Chronic obstructive pulmonary disease, unspecified COPD, unspecified chronic bronchitis type   . Essential hypertension   . Bilateral low back pain without sciatica   . Lower abdominal pain   . Smoker   .  Impaired fasting glucose     Plan: Bells palsy - reviewed neurology notes from yesterday COPD - advised he stop tobacco, advised he use Symbicort every day, BID for preventative measures HTN - not on medication, non compliant.  Start Losartan daily. Bilateral low back pain, abdominal pain x 1+ week.   Reviewed the ED report, recent labs.   Begin Ibuprofen for musculoskeletal pain, relative rest advised, stretching advised, good hydration advised.  F/u 1wk.  Return soon if worsening. Impaired glucose - recheck next visit

## 2015-09-02 NOTE — Telephone Encounter (Signed)
A close friend called to say pt has an appt today and she is very concerned with him.  She states he has bells palsey again and Neuro thinks he may have Lyme disease.  She said he can't work, can't sleep, stays nauseous, can't eat.  She said he went to ER and had seizure while waiting to be seen.  All of this started weekend before last.

## 2015-09-03 NOTE — Telephone Encounter (Signed)
-----   Message from Melvyn Novas, MD sent at 09/02/2015  2:10 PM EDT ----- Already resulted, please call patient.

## 2015-09-03 NOTE — Telephone Encounter (Signed)
Called patient and informed him that his blood sugars were very elevated- He had confirmed that he had eaten prior to this visit for this may not be an indication of developing diabetes.- One of his liver function tests was elevated but had been at the same level III months prior. Advised him to continue hydrating and to finish his anti-viral medication course per Dr. Vickey Huger. Patient verbalized understanding.

## 2015-09-04 ENCOUNTER — Encounter: Payer: Self-pay | Admitting: Medical

## 2015-09-04 ENCOUNTER — Telehealth: Payer: Self-pay | Admitting: Medical

## 2015-09-04 NOTE — Telephone Encounter (Signed)
Give note through mid next week, make him f/u appt for next week to recheck.

## 2015-09-04 NOTE — Telephone Encounter (Signed)
Pt called and stated that he needed a note clearing him for work. I am sending back job description that was faxed over from his employer. Please fax note back.

## 2015-09-04 NOTE — Telephone Encounter (Signed)
Per [employer] Hews letter was typed and faxed to pt's job per his request to be out of work until his is reassessed on Wednesday.

## 2015-09-07 ENCOUNTER — Telehealth: Payer: Self-pay

## 2015-09-07 NOTE — Telephone Encounter (Signed)
-----   Message from Melvyn Novas, MD sent at 09/07/2015  8:21 AM EDT -----   ----- Message -----    From: SYSTEM    Sent: 09/06/2015  12:04 AM      To: Melvyn Novas, MD, [facility]

## 2015-09-07 NOTE — Telephone Encounter (Signed)
-----   Message from Carmen Dohmeier, MD sent at 09/07/2015  8:21 AM EDT -----   ----- Message -----    From: SYSTEM    Sent: 09/06/2015  12:04 AM      To: Carmen Dohmeier, MD, [facility]   

## 2015-09-07 NOTE — Telephone Encounter (Signed)
Spoke with pt and relayed these results to him. I advised him to follow up with his PCP regarding his elevated liver enzyme but to also follow up with Dr. Dohmeier in 2-3 weeks as directed. Pt verbalized understanding. 

## 2015-09-07 NOTE — Telephone Encounter (Signed)
Please call Mr. Andre [name] and let him know that his blood sugars were very elevated. He had confirmed that he had eaten prior to this visit for this may not be an indication of developing diabetes. One of his liver function tests was elevated but had been at the same level III months prior. I would like for him to continue hydrating and finishing his anti-viral medication course.CD  I advised pt of Dr. Oliva Bustard result notes. I advised pt to follow up with PCP regarding liver enzyme and to follow up with Dr. Vickey Huger as directed in 2-3 weeks. Pt verbalized understanding.

## 2015-09-07 NOTE — Telephone Encounter (Signed)
Spoke with pt and relayed these results to him. I advised him to follow up with his PCP regarding his elevated liver enzyme but to also follow up with Dr. Vickey Huger in 2-3 weeks as directed. Pt verbalized understanding.

## 2015-09-07 NOTE — Telephone Encounter (Signed)
-----   Message from Lilla Shook, RN sent at 09/07/2015  8:18 AM EDT -----   ----- Message -----    From: SYSTEM    Sent: 09/06/2015  12:04 AM      To: Melvyn Novas, MD, [facility]

## 2015-09-08 ENCOUNTER — Other Ambulatory Visit: Payer: Self-pay | Admitting: Neurology

## 2015-09-08 ENCOUNTER — Ambulatory Visit
Admission: RE | Admit: 2015-09-08 | Discharge: 2015-09-08 | Disposition: A | Discharge status: Home / Self Care | Payer: 59 | Source: Ambulatory Visit | Attending: Neurology | Admitting: Neurology

## 2015-09-08 ENCOUNTER — Telehealth: Payer: Self-pay

## 2015-09-08 DIAGNOSIS — G510 Bell's palsy: Secondary | ICD-10-CM

## 2015-09-08 DIAGNOSIS — Z77018 Contact with and (suspected) exposure to other hazardous metals: Secondary | ICD-10-CM

## 2015-09-08 DIAGNOSIS — R61 Generalized hyperhidrosis: Secondary | ICD-10-CM

## 2015-09-08 MED ORDER — GADOBENATE DIMEGLUMINE 529 MG/ML IV SOLN
20.0000 mL | Freq: Once | INTRAVENOUS | Status: AC | PRN
  Administered 2015-09-08: 20 mL via INTRAVENOUS

## 2015-09-08 NOTE — Telephone Encounter (Signed)
Attempted to call pt and give results but his mailbox is full. Will try again later.

## 2015-09-08 NOTE — Telephone Encounter (Signed)
-----   Message from Carmen Dohmeier, MD sent at 09/08/2015  4:51 PM EDT ----- No evidence of metal object , cleared for MRI 

## 2015-09-09 ENCOUNTER — Telehealth: Payer: Self-pay | Admitting: Family Medicine

## 2015-09-09 ENCOUNTER — Encounter: Payer: Self-pay | Admitting: Family Medicine

## 2015-09-09 ENCOUNTER — Encounter: Payer: Self-pay | Admitting: Medical

## 2015-09-09 ENCOUNTER — Ambulatory Visit (INDEPENDENT_AMBULATORY_CARE_PROVIDER_SITE_OTHER): Payer: 59 | Admitting: Medical

## 2015-09-09 ENCOUNTER — Telehealth: Payer: Self-pay | Admitting: Neurology

## 2015-09-09 VITALS — BP 142/98 | HR 68 | Wt 361.4 lb

## 2015-09-09 DIAGNOSIS — H04122 Dry eye syndrome of left lacrimal gland: Secondary | ICD-10-CM | POA: Diagnosis not present

## 2015-09-09 DIAGNOSIS — G510 Bell's palsy: Secondary | ICD-10-CM | POA: Diagnosis not present

## 2015-09-09 DIAGNOSIS — R51 Headache: Secondary | ICD-10-CM

## 2015-09-09 DIAGNOSIS — E669 Obesity, unspecified: Secondary | ICD-10-CM

## 2015-09-09 DIAGNOSIS — I10 Essential (primary) hypertension: Secondary | ICD-10-CM

## 2015-09-09 DIAGNOSIS — IMO0002 Reserved for concepts with insufficient information to code with codable children: Secondary | ICD-10-CM

## 2015-09-09 DIAGNOSIS — E1165 Type 2 diabetes mellitus with hyperglycemia: Secondary | ICD-10-CM

## 2015-09-09 DIAGNOSIS — R519 Headache, unspecified: Secondary | ICD-10-CM | POA: Insufficient documentation

## 2015-09-09 DIAGNOSIS — E118 Type 2 diabetes mellitus with unspecified complications: Secondary | ICD-10-CM | POA: Insufficient documentation

## 2015-09-09 NOTE — Telephone Encounter (Signed)
Advised pt that his xray of his eyes was good, no evidence of metal and he is cleared for MRI per Dr. Vickey Huger. Pt verbalized understanding. He said he had his MRI right after test anyways.

## 2015-09-09 NOTE — Telephone Encounter (Signed)
Pt called states he needs letter showing we had him out of work and that [employer] is a Development worker, community.  I explained that Vincenza Hews is a Advice worker.  Will send letter.

## 2015-09-09 NOTE — Telephone Encounter (Signed)
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 The patient does have some issues with dryness of the affected eye, he is able to forcibly close eye with effort. I have indicated that there is no contraindication for him to return to work. He'll need to be careful about trying to keep the eye moist, he can forcefully close eye intermittent, and prevent the air-conditioning from blowing on the eye.

## 2015-09-09 NOTE — Progress Notes (Signed)
Subjective: Here for recheck on facial pain and weakness form last week's visit.  He notes much less pain, pain is minimal now in the face.   Still has the facial weakness.  Ready to return to work, doesn't feel any reason he can't work now.   Since last visit neurology called him back and said labs were fine.   He has complete his steroid and antiviral medication.  He started the new BP medication.  He has no new c/o.     At his last visit which was ED f/u, he had been treated and released for facial weakness and a variety of symptoms.   He saw neurology yesterday for consult since his ED visit recently.  Was diagnosed with bells palsy.   He has several symptoms he is concerned about.  No other aggravating or relieving factors. No other complaint.  Past Medical History  Diagnosis Date  . Depression   . Bell's palsy     at 6th or 7th grade on right side    ROS as in subjective   Objective: BP 142/98 mmHg  Pulse 68  Wt 361 lb 6.4 oz (163.93 kg)  Gen: obese white male, nad Skin dull to sensation over left face Neck supple, nontender, normal ROM Heart rrr, normal s1, s2, no murmurs Lungs clear Abdomen: no tenderness, no mass, no organomegaly, +bs Back: mild lumbar tender, mild pain with ROM which is full Ext: no edema Pulses normal Obvious left facial weakness CN2-12 intact otherwise, left peripheral vision right at 70 degrees, otherwise visual acuity is ok   Assessment: Encounter Diagnoses  Name Primary?  . Facial paralysis/Bells palsy Yes  . Dry eye of left side   . Morbid obesity   . Essential hypertension, benign   . Diabetes type 2, uncontrolled   . Facial pain     Plan: Called and discussed his case with Dr. Dorene Sorrow Neurology since Dr. Vickey Huger is out of the office today.  He is much improved in regards to pain, sleep and overall malaise.  sill has facial weakness.  discussed avoiding vehicle air conditioning and wind blowing directly into the face/eye.  Advised  he use wetting drops frequently.  Sleep with eye patched.   If at any time he feels problems with vision or feels unsafe, then stop driving and contact us.   He understands and agrees with plan.    COPD - advised he stop tobacco, advised he use Symbicort every day, BID for preventative measures  HTN - [place]/t Losartan daily.  Labs show new diagnosis of diabetes type 2 today.   discussed diagnosis, treatment, diet and exercise changes that are needed  F/u 69mo

## 2015-09-09 NOTE — Telephone Encounter (Signed)
-----   Message from Melvyn Novas, MD sent at 09/08/2015  4:51 PM EDT ----- No evidence of metal object , cleared for MRI

## 2015-09-10 ENCOUNTER — Ambulatory Visit: Payer: Self-pay | Admitting: Medical

## 2015-09-16 ENCOUNTER — Telehealth: Payer: Self-pay

## 2015-09-16 NOTE — Telephone Encounter (Signed)
-----   Message from Melvyn Novas, MD sent at 09/15/2015  1:02 PM EDT ----- Inflamed facial nerve noted on MRI, fits bells pasly- not a tumor not an infection.

## 2015-09-16 NOTE — Telephone Encounter (Signed)
Attempted to call pt with MRI results. Mailbox is full, unable to leave a message. Will try again later.

## 2015-09-17 ENCOUNTER — Telehealth: Payer: Self-pay

## 2015-09-17 NOTE — Telephone Encounter (Signed)
Advised pt that his MRI showed an inflamed facial nerve which fits bells palsy. No tumor or infection noted on MRI. Pt verbalized understanding. Pt said he would like to keep his 10/3 appt.

## 2015-09-28 ENCOUNTER — Encounter: Payer: Self-pay | Admitting: Neurology

## 2015-09-28 ENCOUNTER — Ambulatory Visit (INDEPENDENT_AMBULATORY_CARE_PROVIDER_SITE_OTHER): Payer: 59 | Admitting: Neurology

## 2015-09-28 VITALS — BP 152/100 | HR 68 | Resp 20 | Ht 76.0 in | Wt 361.0 lb

## 2015-09-28 DIAGNOSIS — R432 Parageusia: Secondary | ICD-10-CM | POA: Insufficient documentation

## 2015-09-28 DIAGNOSIS — H93239 Hyperacusis, unspecified ear: Secondary | ICD-10-CM | POA: Insufficient documentation

## 2015-09-28 DIAGNOSIS — H93232 Hyperacusis, left ear: Secondary | ICD-10-CM

## 2015-09-28 DIAGNOSIS — G510 Bell's palsy: Secondary | ICD-10-CM | POA: Insufficient documentation

## 2015-09-28 NOTE — Progress Notes (Signed)
[facility]   Provider:  Melvyn Novas, M D  Referring Provider: Ronnald Nian, MD Primary Care Physician:  Carollee Herter, MD  Chief Complaint  Patient presents with  . Follow-up    MRI results, bells palsy, rm 11, alone    HPI:  Andre [name] is a 34 y.o. male , seen here as a referral from Dr. Susann Givens for facial numbness and weakness, thought to reflect Bells palsy   Chief complaint according to patient : "My face became weak and numb on August 26."  The patient reports that he first seek medical attention on 06-02-15 when he felt tingling in his upper lip mostly on the left side of the face and a feeling of tingling numbness in the face. After waiting multiple hours in the emergency room's waiting area he left without being seen. Andre [name] reports that he had presented  to his primary care physician. He was seen again in the  [facility] on 08-23-15 by Dr. Ranae Palms, whose report is available to me here today.  Constant left-sided headache for 2 days at the time of presentation,  left-sided neck pain, weakness and numbness to the left side of the face which has gradually worsened after initial onset on August 26. He also reported that his speech was slightly slurred. He reported numbness to the tongue and loss of taste. He states his left  ear is "beeping ", bothering him and he  can hardly  understand what people say. He has hyperacusis for high frequencies. He cannot close his left eye. His left eye is slightly inlet irritated and also his right eye has a ciliary injection. He reports further that he feels weakness in his hands and extremities all over feeling heavy. Andre [name] reports that he was sitting for about 5 hours at the emergency rooms waiting area and was told he suffered a seizure. He has of course no recollection of it but he woke he was on a stretcher in the emergency room. Please note that I have no documentation of this so-called seizure from the  emergency room physician. He reports that he feels sore and achy all over. All this started as his facial numbness started. He reports fever elevated temperature the feeling of chills. Myalgias and neck pain. He reports headaches. Interestingly, Andre [name] reports that he had Bell's palsy as a child but it affected his right face at that time he was age 72.  The patient is status post anterior cervical fusion in the year 2011, prior to his surgery he had lost some grip strength in his right hand.  Social history:  Designer, fashion/clothing, drives new cars.  Smoker 2 ppd, no ETOH.  09-28-15 Mr. Andre [name] is here for a check your revisit. The patient had undergone an MRI of the head with and without contrast on 8-20 8-16 which showed an abnormal enhancement of the left facial nerve at the fundus of the internal carotid artery. Consistent with the provided health history of Bell's palsy. There is no to more and no seizure focus identified. He has small punctate scars in the cerebral white matter which could be related to vasculitis, migraine or prior infection. The study was interpreted on 09-08-15. We also obtained a comprehensive metabolic panel which showed high glucose attributed to the stellate intake at the time. Chloride was slightly reduced at 96 mmol, and his ALT was increased at 70. This may be related to acyclovir.  He reported flank pain at the time of  intake. He continued to have hyperacusis and taste changes, but his facial droop is almost completely resolved.  He reports that he has to avoid getting water into his left ear , as it will  lead to him losing his balance while showering..   Review of Systems: Out of a complete 14 system review, the patient complains of only the following symptoms, and all other reviewed systems are negative. Tinnitus, hyperacusis, now able to close left eye.      Social History   Social History  . Marital Status: Married    Spouse Name: N/A  . Number of Children: 1    . Years of Education: 12th   Occupational History  .  Other    Andre [name] and Andre [name]   Social History Main Topics  . Smoking status: Current Every Day Smoker -- 2.00 packs/day    Types: Cigarettes  . Smokeless tobacco: Never Used  . Alcohol Use: No  . Drug Use: No  . Sexual Activity: Yes   Other Topics Concern  . Not on file   Social History Narrative   ** Merged History Encounter **   Patient lives at home alone.   Caffeine Use: 2 drinks daily         No family history on file.  Past Medical History  Diagnosis Date  . Depression   . Bell's palsy     at 6th or 7th grade on right side     Past Surgical History  Procedure Laterality Date  . Cervical fusion    . Skin graft Right     below right knee, medial     Current Outpatient Prescriptions  Medication Sig Dispense Refill  . albuterol (PROVENTIL HFA;VENTOLIN HFA) 108 (90 BASE) MCG/ACT inhaler Inhale 2 puffs into the lungs every 6 (six) hours as needed for wheezing or shortness of breath. 1 Inhaler 0  . budesonide-formoterol (SYMBICORT) 160-4.5 MCG/ACT inhaler Inhale 2 puffs into the lungs 2 (two) times daily. 1 Inhaler 2  . ibuprofen (ADVIL,MOTRIN) 800 MG tablet Take 1 tablet (800 mg total) by mouth every 8 (eight) hours as needed. 30 tablet 0  . losartan (COZAAR) 25 MG tablet Take 1 tablet (25 mg total) by mouth daily. 30 tablet 2  . ondansetron (ZOFRAN) 4 MG tablet Take 1 tablet (4 mg total) by mouth every 8 (eight) hours as needed for nausea or vomiting. 20 tablet 0   Current Facility-Administered Medications  Medication Dose Route Frequency Provider Last Rate Last Dose  . albuterol (PROVENTIL) (2.5 MG/3ML) 0.083% nebulizer solution 2.5 mg  2.5 mg Nebulization Once Jac Canavan, PA-C        Allergies as of 09/28/2015  . (No Known Allergies)    Vitals: BP 152/100 mmHg  Pulse 68  Resp 20  Ht  (1.93 m)  Wt 361 lb (163.749 kg)  BMI 43.96 kg/m2 Last Weight:  Wt Readings from Last 1 Encounters:   09/28/15 361 lb (163.749 kg)   GNF:AOZH mass index is 43.96 kg/(m^2).     Last Height:   Ht Readings from Last 1 Encounters:  09/28/15  (1.93 m)    Physical exam:  General: The patient is awake, alert and appears not in acute distress. The patient is well groomed. Head: Normocephalic, atraumatic. Neck is supple. Mallampati 4,  neck circumference:18, Retrognathia is not seen.  Cardiovascular:  Regular rate and rhythm , without  murmurs or carotid bruit, and without distended neck veins. Respiratory: Lungs are clear to auscultation.  Skin:  Without evidence of edema, or rash Trunk: obese  Neurologic exam : The patient is awake and alert, oriented to place and time.  Attention span & concentration ability appears normal.  Speech is fluent,  With dysarthria, but not dysphonia or aphasia.  Mood and affect are appropriate.  Cranial nerves:  he can taste sweet and not sour, tastes bitter and is not sure about salty.  He feels spaghetti taste "weird", pizza, ketchup too. But he can taste vinegar and citrus juice. He cannot taste chinese food well. He eats a lot of bananas, used to hate them -by texture.   Pupils are equal and briskly reactive to light. No evidence of corneal abrasion,  Extraocular movements  in vertical and horizontal planes intact and without nystagmus. Visual fields by finger perimetry are intact. Hearing to finger rub intact. He has hyperacusis.   Facial sensation intact to fine touch.  Facial motor strength is symmetric and tongue and uvula move midline. Shoulder shrug was symmetrical.   Motor exam:   Normal tone, muscle bulk and symmetric strength in all extremities.  Sensory:  Fine touch, pinprick and vibration were tested in all extremities.   Coordination: Rapid alternating movements in the fingers/hands was normal. Finger-to-nose maneuver with evidence of dysmetria in the left.  Gait and station: Patient walks without assistive device and is able unassisted  to climb up to the exam table. Strength within normal limits.  Stance is stable and normal.  Toe and heel stand were intact, , has normal arm swing and does not drift.   Deep tendon reflexes: in the  upper and lower extremities are symmetric and intact. Babinski maneuver response is downgoing.  Assessment:  After physical and neurologic examination, review of laboratory studies,  Personal review of imaging studies, reports of other /same  Imaging studies ( new MRI ) which I personally reviewed and presented to the patient. MRI of the head ,  Results of pre-existing records as far as provided in visit., my 20 minute assessment is :  1) Mr. Christiane Ha Gehret does have residuals to his left sided  Bell's palsy. This was the second time he suffers from palsy,  of the first time would have been well over 20 years ago.  He reports persistent changes in his ability to taste certain foods and especially [place] food and [place] food bolus in a play of sweet and sour seemed to be hard for him to taste appropriately. He still has some hyperacusis. His facial palsy has almost resolved. He has balance problems, especially when water enters his left ear.   3) Follow up with PCP MD . Continue to drink water and hydrate well.       Porfirio Mylar Zaire Levesque MD  09/28/2015   CC: Ronnald Nian, Md [address] [place], [place] [address]

## 2015-12-27 DIAGNOSIS — R7989 Other specified abnormal findings of blood chemistry: Secondary | ICD-10-CM

## 2015-12-27 HISTORY — DX: Other specified abnormal findings of blood chemistry: R79.89

## 2016-01-26 ENCOUNTER — Encounter: Payer: Self-pay | Admitting: Family Medicine

## 2016-01-26 ENCOUNTER — Ambulatory Visit (INDEPENDENT_AMBULATORY_CARE_PROVIDER_SITE_OTHER): Payer: 59 | Admitting: Family Medicine

## 2016-01-26 VITALS — BP 148/100 | HR 94 | Temp 98.3°F | Wt 365.0 lb

## 2016-01-26 DIAGNOSIS — J4520 Mild intermittent asthma, uncomplicated: Secondary | ICD-10-CM | POA: Diagnosis not present

## 2016-01-26 DIAGNOSIS — I10 Essential (primary) hypertension: Secondary | ICD-10-CM | POA: Diagnosis not present

## 2016-01-26 DIAGNOSIS — J209 Acute bronchitis, unspecified: Secondary | ICD-10-CM | POA: Diagnosis not present

## 2016-01-26 DIAGNOSIS — E118 Type 2 diabetes mellitus with unspecified complications: Secondary | ICD-10-CM | POA: Diagnosis not present

## 2016-01-26 DIAGNOSIS — I152 Hypertension secondary to endocrine disorders: Secondary | ICD-10-CM

## 2016-01-26 DIAGNOSIS — E1159 Type 2 diabetes mellitus with other circulatory complications: Secondary | ICD-10-CM | POA: Diagnosis not present

## 2016-01-26 MED ORDER — ALBUTEROL SULFATE HFA 108 (90 BASE) MCG/ACT IN AERS: 2.0000 | INHALATION_SPRAY | Freq: Four times a day (QID) | RESPIRATORY_TRACT | Status: DC | PRN

## 2016-01-26 MED ORDER — LEVOFLOXACIN 500 MG PO TABS: 500.0000 mg | ORAL_TABLET | Freq: Every day | ORAL | Status: DC

## 2016-01-26 MED ORDER — LOSARTAN POTASSIUM 25 MG PO TABS
25.0000 mg | ORAL_TABLET | Freq: Every day | ORAL | Status: DC
Start: 2016-01-26 — End: 2016-02-24

## 2016-01-26 NOTE — Progress Notes (Signed)
   Subjective:    Patient ID: Andre [name], male    DOB: [birth year], 35 y.o.   MRN: [id number]  HPI He states that 3 days ago he developed a dry cough, malaise and shortly after that temperature the 102 with myalgias and some slight shortness of breath. The symptoms have continued. No earache, sore throat, rhinorrhea or itchy watery eyes. He does smoke. He has a previous history of asthma and was on Symbicort however at the present time he is only using Symbicort once or twice per week and only one inhalation. Review of the record indicates that he is supposed be on a blood pressure medication however he does not state he is on 1. Also review of record indicates that he was diagnosed with diabetes however the A1c is not listed in the record. This was researched and he did have an A1c of 6.7. He is unaware of this diagnosis.   Review of Systems     Objective:   Physical Exam Alert and in no distress. Tympanic membranes and canals are normal. Pharyngeal area is normal. Neck is supple without adenopathy or thyromegaly. Cardiac exam shows a regular sinus rhythm without murmurs or gallops. Lungs Show diffuse wheezing        Assessment & Plan:  Type 2 diabetes mellitus with complication, without long-term current use of insulin (HCC)  Hypertension associated with diabetes (HCC)  Asthma, mild intermittent, uncomplicated  Acute bronchitis, unspecified organism He will be placed back on his blood pressure medication as well as Levaquin and an inhaler.He is to follow-up here in approximately one month.

## 2016-02-01 ENCOUNTER — Encounter: Payer: Self-pay | Admitting: Family Medicine

## 2016-02-01 ENCOUNTER — Telehealth: Payer: Self-pay | Admitting: Family Medicine

## 2016-02-01 NOTE — Telephone Encounter (Signed)
Pt was seen here in the office 1/31.  He states he was out of work from 1/30 until 2/3.  He states he is still having chills and fever.  Pt is requesting out of work note from 1/31 - 2/3 to be faxed to [phone].  Please advise and I will complete.

## 2016-02-01 NOTE — Telephone Encounter (Signed)
Go ahead and write him a note that says that the patient "states"that he was unable to work for the timeframe she mentioned.

## 2016-02-02 ENCOUNTER — Encounter: Payer: Self-pay | Admitting: Family Medicine

## 2016-02-02 NOTE — Telephone Encounter (Signed)
Letter was typed and faxed. Called pt was unable to leave a message, mail box full

## 2016-02-24 ENCOUNTER — Encounter: Payer: Self-pay | Admitting: Medical

## 2016-02-24 ENCOUNTER — Ambulatory Visit (INDEPENDENT_AMBULATORY_CARE_PROVIDER_SITE_OTHER): Payer: 59 | Admitting: Medical

## 2016-02-24 VITALS — BP 150/94 | HR 70 | Wt 366.0 lb

## 2016-02-24 DIAGNOSIS — E1159 Type 2 diabetes mellitus with other circulatory complications: Secondary | ICD-10-CM

## 2016-02-24 DIAGNOSIS — I10 Essential (primary) hypertension: Secondary | ICD-10-CM

## 2016-02-24 DIAGNOSIS — Z720 Tobacco use: Secondary | ICD-10-CM | POA: Diagnosis not present

## 2016-02-24 DIAGNOSIS — R7989 Other specified abnormal findings of blood chemistry: Secondary | ICD-10-CM | POA: Insufficient documentation

## 2016-02-24 DIAGNOSIS — E119 Type 2 diabetes mellitus without complications: Secondary | ICD-10-CM | POA: Diagnosis not present

## 2016-02-24 DIAGNOSIS — H6593 Unspecified nonsuppurative otitis media, bilateral: Secondary | ICD-10-CM | POA: Diagnosis not present

## 2016-02-24 DIAGNOSIS — R945 Abnormal results of liver function studies: Secondary | ICD-10-CM

## 2016-02-24 DIAGNOSIS — F17200 Nicotine dependence, unspecified, uncomplicated: Secondary | ICD-10-CM

## 2016-02-24 DIAGNOSIS — E118 Type 2 diabetes mellitus with unspecified complications: Secondary | ICD-10-CM

## 2016-02-24 LAB — POCT GLYCOSYLATED HEMOGLOBIN (HGB A1C): Hemoglobin A1C: 6.5

## 2016-02-24 LAB — GLUCOSE, POCT (MANUAL RESULT ENTRY): POC GLUCOSE: 111 mg/dL — AB (ref 70–99)

## 2016-02-24 MED ORDER — LOSARTAN POTASSIUM-HCTZ 50-12.5 MG PO TABS
1.0000 | ORAL_TABLET | Freq: Every day | ORAL | Status: DC
Start: 2016-02-24 — End: 2016-10-28

## 2016-02-24 MED ORDER — DOXYCYCLINE HYCLATE 100 MG PO TABS
100.0000 mg | ORAL_TABLET | Freq: Two times a day (BID) | ORAL | Status: DC
Start: 2016-02-24 — End: 2016-06-22

## 2016-02-24 MED ORDER — FLUTICASONE PROPIONATE 50 MCG/ACT NA SUSP: 2.0000 | Freq: Every day | NASAL | Status: DC

## 2016-02-24 NOTE — Progress Notes (Signed)
Subjective: Chief Complaint  Patient presents with  . Follow-up    has not checked  it but is taking pill   Here for f/u on BP, glucose.   About a year ago we noticed his sugars being elevated.   He denies any polyuria, polydipsia, weight change or blurred vision.   Feeling fine of late.   Is compliant with his losartan  daily.  Not checking glucose or BPs.   He continues to smoke.   Still having ear fullness, popping, ear pressure since last visit.  No other aggravating or relieving factors. No other complaint.  Past Medical History  Diagnosis Date  . Depression   . Bell's palsy     at 6th or 7th grade on right side    ROS as in subjective   Objective; BP 150/94 mmHg  Pulse 70  Wt 366 lb (166.017 kg)  General appearance: alert, no distress, WD/WN, obese white male bilat TMs with flat appearance, effusion, HENT otherwise unremarkable Neck: supple, no lymphadenopathy, no thyromegaly, no masses Heart: RRR, normal S1, S2, no murmurs Lungs: CTA bilaterally, no wheezes, rhonchi, or rales Ext: no edema Pulses: 2+ symmetric, upper and lower extremities, normal cap refill    Assessment: Encounter Diagnoses  Name Primary?  . Hypertension associated with diabetes (HCC) Yes  . Type 2 diabetes mellitus with complication, without long-term current use of insulin (HCC)   . Elevated LFTs   . Smoker   . Bilateral serous otitis media, recurrence not specified, unspecified chronicity      Plan: HTN - not at goal. Change to Losartan HCT 50/12.5mg  daily.  Monitor BP at home. Avoid salt, needs to exercise  Type 2 diabetes - he never recalls [place] discussing sugars a year ago.  Nevertheless, he has had several high glucose readings over the past year in review of prior lab.    HgbA1C 6.5 % today.   Fingerstick fasting glucose borderline today.   Discussed diagnosis of diabetes, needed diet and exercise changes.   He declines medication today.   Discussed diet, exercise, f/u.     Elevated  LFTs - likely due to fatty liver disease.  He does not drink alcohol .  discussed lifestyle changes, plan to recheck liver tests at next f/u.  Smoker - again encouraged him to stop tobacco. He is not ready to quit.   OM - Begin doxycycline and Flonase.

## 2016-02-26 ENCOUNTER — Telehealth: Payer: Self-pay | Admitting: Medical

## 2016-02-26 NOTE — Telephone Encounter (Signed)
Please call patient.  I meant to ask him about this the other day.  We work with a company that does research studies and medication trials, called [employer].   They are currently doing a study on a medication for COPD/breathing.     Participants in the study receive compensation, free medication, close follow up and monitoring.   I wanted to let them know about this study because he qualifies for this study.   See if they have any interest in being part of a study.  If agreeable, pass on his info to [employer] and let me know.  If they desire not to be part of the study, that is fine.   We will continue their current medications and/or treatment as usual.

## 2016-02-26 NOTE — Telephone Encounter (Signed)
Faxed over pt is on the fence

## 2016-06-22 ENCOUNTER — Ambulatory Visit (INDEPENDENT_AMBULATORY_CARE_PROVIDER_SITE_OTHER): Payer: 59 | Admitting: Family Medicine

## 2016-06-22 ENCOUNTER — Encounter: Payer: Self-pay | Admitting: Family Medicine

## 2016-06-22 VITALS — BP 144/80 | HR 64 | Temp 98.0°F | Wt 365.6 lb

## 2016-06-22 DIAGNOSIS — H7292 Unspecified perforation of tympanic membrane, left ear: Secondary | ICD-10-CM

## 2016-06-22 DIAGNOSIS — Z720 Tobacco use: Secondary | ICD-10-CM

## 2016-06-22 DIAGNOSIS — F17200 Nicotine dependence, unspecified, uncomplicated: Secondary | ICD-10-CM

## 2016-06-22 NOTE — Progress Notes (Signed)
   Subjective:    Patient ID: Andre [name], male    DOB: [birth year], 35 y.o.   MRN: [id number]  HPI Chief Complaint  Patient presents with  . ear pain    ear pain- infection since saturday.    He is here with complaints of left ear discomfort that [place]. States it feels like there is water in his left ear and sounds are diminished.  Denies fever, chills, sinus pressure, congestion, nausea, vomiting.  He is aware that his blood pressure is mildly elevated today and states he has not been taking his blood pressure medication.   Allergies, medications, past medical and social history. He is a smoker and no plan to quit.   Review of Systems Pertinent positives and negatives in the history of present illness.     Objective:   Physical Exam BP 144/80 mmHg  Pulse 64  Temp(Src) 98 F (36.7 C) (Oral)  Wt 365 lb 9.6 oz (165.835 kg) Alert and in no distress. No sinus tenderness. Left ympanic membrane with posterior perforation, no drainage or infection noted, Right tympanic membrane normal and canals are normal bilaterally. No tenderness to external ears bilaterally. Pharyngeal area is normal. Neck is supple without adenopathy or thyromegaly.       Assessment & Plan:  Ruptured tympanic membrane, left  Smoker  Dr. Susann GivensLalonde also examined patient and agrees with plan of care. Discussed with patient that he has a ruptured TM and recommend that he avoid swimming for the next 2-3 weeks and to let it heal. He does not have an infection at this time and encouraged him to report back if he develops fever, chills, or worsening pain to that ear. Discussed that this should heal on it's own in the next few weeks. He may return in 3-4 weeks if he would like for us to check it and see how it is healing.  Recommend that he start taking his blood pressure medication. Discussed long term potential complications from uncontrolled blood pressure. Also recommend  that he stop smoking to improve his overall health. He has no interest in doing this at this time.

## 2016-06-22 NOTE — Patient Instructions (Signed)
Tympanic Membrane Perforation The eardrum (tympanic membrane) protects the inner ear from the outside environment. In addition to protection, the eardrum allows you to hear by transmitting sound waves to the bones in your ear and then to the nervous system. The tympanic membrane is easily perforated, which may result in damage to the inner ear. SYMPTOMS   Sometimes there are no symptoms.  Decreased hearing.  Fluid drainage from ear.  Ear pain. CAUSES   Most commonly, a middle ear infection from built-up pressure.  Injury from a cotton swab.  Traumatic injury to the side of the head. RISK INCREASES WITH:  Frequent middle ear infections.  Use of cotton swabs. PREVENTION   Do not use cotton swabs to clean the ear canal.  If you have ear pain or pressure, see your caregiver to rule out an ear infection that needs treatment. TREATMENT  Protecting the inner ear and allowing the membrane to heal on its own is how tympanic membrane rupture is usually treated. Healing may take several weeks. In order to protect the inner ear, do not allow any fluid to enter the ear canal. Avoid being submerged in water. The use of ear drops may prevent an ear infection from developing, but they should be used with caution, as ear drops can also cause damage to the inner ear. It is important to follow up with your caregiver to confirm healing of the tympanic membrane. If the membrane does not heal, permanent hearing loss may occur. To avoid serious complications, tympanic membranes that do not heal on their own are repaired with surgery.   This information is not intended to replace advice given to you by your health care provider. Make sure you discuss any questions you have with your health care provider.   Document Released: 12/12/2005 Document Revised: 03/05/2012 Document Reviewed: 06/24/2015 Elsevier Interactive Patient Education 2016 [employer]  

## 2016-10-28 ENCOUNTER — Encounter: Payer: Self-pay | Admitting: Medical

## 2016-10-28 ENCOUNTER — Ambulatory Visit (INDEPENDENT_AMBULATORY_CARE_PROVIDER_SITE_OTHER): Payer: Self-pay | Admitting: Medical

## 2016-10-28 VITALS — BP 136/82 | HR 82 | Wt 332.0 lb

## 2016-10-28 DIAGNOSIS — R7989 Other specified abnormal findings of blood chemistry: Secondary | ICD-10-CM

## 2016-10-28 DIAGNOSIS — E1159 Type 2 diabetes mellitus with other circulatory complications: Secondary | ICD-10-CM

## 2016-10-28 DIAGNOSIS — Z9989 Dependence on other enabling machines and devices: Secondary | ICD-10-CM | POA: Insufficient documentation

## 2016-10-28 DIAGNOSIS — R945 Abnormal results of liver function studies: Secondary | ICD-10-CM

## 2016-10-28 DIAGNOSIS — I10 Essential (primary) hypertension: Secondary | ICD-10-CM

## 2016-10-28 DIAGNOSIS — F17200 Nicotine dependence, unspecified, uncomplicated: Secondary | ICD-10-CM

## 2016-10-28 DIAGNOSIS — R739 Hyperglycemia, unspecified: Secondary | ICD-10-CM | POA: Insufficient documentation

## 2016-10-28 DIAGNOSIS — G4733 Obstructive sleep apnea (adult) (pediatric): Secondary | ICD-10-CM | POA: Insufficient documentation

## 2016-10-28 DIAGNOSIS — E118 Type 2 diabetes mellitus with unspecified complications: Secondary | ICD-10-CM

## 2016-10-28 LAB — CBC
HCT: 48.5 % (ref 38.5–50.0)
HEMOGLOBIN: 16.5 g/dL (ref 13.2–17.1)
MCH: 31.4 pg (ref 27.0–33.0)
MCHC: 34 g/dL (ref 32.0–36.0)
MCV: 92.2 fL (ref 80.0–100.0)
MPV: 10.7 fL (ref 7.5–12.5)
PLATELETS: 229 10*3/uL (ref 140–400)
RBC: 5.26 MIL/uL (ref 4.20–5.80)
RDW: 12.6 % (ref 11.0–15.0)
WBC: 5.2 10*3/uL (ref 4.0–10.5)

## 2016-10-28 LAB — POCT URINALYSIS DIPSTICK
BILIRUBIN UA: NEGATIVE
Blood, UA: NEGATIVE
KETONES UA: NEGATIVE
LEUKOCYTES UA: NEGATIVE
Nitrite, UA: NEGATIVE
Protein, UA: NEGATIVE
Spec Grav, UA: >=1.03
Urobilinogen, UA: NEGATIVE
pH, UA: 6

## 2016-10-28 LAB — TSH: TSH: 0.7 mIU/L (ref 0.40–4.50)

## 2016-10-28 LAB — HEMOGLOBIN A1C
Hgb A1c MFr Bld: 11.4 % — ABNORMAL HIGH (ref <5.7)
Mean Plasma Glucose: 280 mg/dL

## 2016-10-28 LAB — GLUCOSE, POCT (MANUAL RESULT ENTRY): POC Glucose: 363 mg/dl — AB (ref 70–99)

## 2016-10-28 MED ORDER — SITAGLIPTIN PHOS-METFORMIN HCL 50-1000 MG PO TABS: 1.0000 | ORAL_TABLET | Freq: Two times a day (BID) | ORAL | 2 refills | Status: DC

## 2016-10-28 NOTE — Addendum Note (Signed)
Addended by: Winn JockVALENTINE, Patric Buckhalter N on: 10/28/2016 12:26 PM   Modules accepted: Orders

## 2016-10-28 NOTE — Patient Instructions (Signed)
Encounter Diagnoses  Name Primary?  . Type 2 diabetes mellitus with complication, without long-term current use of insulin (HCC) Yes  . Hypertension associated with diabetes (HCC)   . Smoker   . Elevated LFTs   . OSA (obstructive sleep apnea)   . Hyperglycemia    Recommendations  We will call with lab results  Your sugar is not controlled.  Your sugar today was over 300.  We will need to begin medications pending lab findings  I need you to make diet and exercise changes and continue efforts to lose weight  From a DOT perspective, your DOT examiner will need a compliance report.  If this can't be obtained easily, I recommend repeating a sleep study  To get new CPAP equipment you will have to get an updated sleep study if your current CPAP is not working properly  I recommend follow up visit here every 3 months to help you monitor your sugar and health issues    Specific recommendations today include: Diet  Increase your water intake, get at least 64 ounces of water daily  Eat 3-4 fruits daily  Eat plenty of vegetables throughout the day, preferably each meal  Eat good sources of grains such as oatmeal, barley, whole grain pasta, whole grain bread, but limit the serving size to 1 cup of oatmeal or pasta per meal or 2 slices of bread per meal  We don't need to meat at each meal, however if you do eat meat, limit serving size to the size of your palm, and eat chicken fish or [place], lean cuts of meat  Eat beans every day as this is a good nutrient source and helps to curb appetite  Consider using a program such as Weight Watchers  Consider using a Smart phone app such as My Fitness PAL or [employer] to track your calories and progress   Things to limit or avoid:  Avoid fast food, fried foods, fatty foods  Limit sweets, ice cream, cake and other baked goods  Avoid soda, beer, alcohol, sweet tea  Exercise  You need to be exercising most days of the week for 30-45  minutes or more  Good forms of exercise include walking, hiking, stationary bike or bicycling outside, lap swimming, aerobics class, dance, Zumba  Consider getting a trainer at a gym to help with exercise  Consider weighing yourself daily to keep track of your weight     I have included other useful information below for your review.  Diabetes Meal Planning Guide The diabetes meal planning guide is a tool to help you plan your meals and snacks. It is important for people with diabetes to manage their blood glucose (sugar) levels. Choosing the right foods and the right amounts throughout your day will help control your blood glucose. Eating right can even help you improve your blood pressure and reach or maintain a healthy weight.  CARBOHYDRATE COUNTING MADE EASY When you eat carbohydrates, they turn to sugar. This raises your blood glucose level. Counting carbohydrates can help you control this level so you feel better. When you plan your meals by counting carbohydrates, you can have more flexibility in what you eat and balance your medicine with your food intake.  Carbohydrate counting simply means adding up the total amount of carbohydrate grams in your meals and snacks. Try to eat about the same amount at each meal. Foods with carbohydrates are listed below.   Each portion below is 1 carbohydrate serving or 15 grams of carbohydrates.  1800 Calorie Diet for Diabetes Meal Planning The 1800 calorie diet is designed for eating up to 1800 calories each day. Following this diet and making healthy meal choices can help improve overall health. This diet controls blood sugar (glucose) levels and can also help lower blood pressure and cholesterol.  SERVING SIZES Measuring foods and serving sizes helps to make sure you are getting the right amount of food. The list below tells how big or small some common serving sizes are:  1 oz.........4 stacked dice.  3 oz........Marland Kitchen.Deck of cards.  1  tsp.......Marland Kitchen.Tip of little finger.  1 tbs......Marland Kitchen.Marland Kitchen.Thumb.  2 tbs.......Marland Kitchen.Golf ball.   cup......Marland Kitchen.Half of a fist.  1 cup.......Marland Kitchen.A fist.   GUIDELINES FOR CHOOSING FOODS The goal of this diet is to eat a variety of foods and limit calories to 1800 each day. This can be done by choosing foods that are low in calories and fat. The diet also suggests eating small amounts of food frequently. Doing this helps control your blood glucose levels so they do not get too high or too low. Each meal or snack may include a protein food source to help you feel more satisfied and to stabilize your blood glucose. Try to eat about the same amount of food around the same time each day. This includes weekend days, travel days, and days off work. Space your meals about 4 to 5 hours apart and add a snack between them if you wish.  For example, a daily food plan could include breakfast, a morning snack, lunch, dinner, and an evening snack. Healthy meals and snacks include whole grains, vegetables, fruits, lean meats, poultry, fish, and dairy products. As you plan your meals, select a variety of foods. Choose from the bread and starch, vegetable, fruit, dairy, and meat/protein groups. Examples of foods from each group and their suggested serving sizes are listed below. Use measuring cups and spoons to become familiar with what a healthy portion looks like. Bread and Starch Each serving equals 15 grams of carbohydrates.  1 slice bread.   bagel.   cup cold cereal (unsweetened).   cup hot cereal or mashed potatoes.  1 small potato (size of a computer mouse).   cup cooked pasta or rice.   English muffin.  1 cup broth-based soup.  3 cups of popcorn.  4 to 6 whole-wheat crackers.   cup cooked beans, peas, or corn. Vegetable Each serving equals 5 grams of carbohydrates.   cup cooked vegetables.  1 cup raw vegetables.   cup tomato or vegetable juice. Fruit Each serving equals 15 grams of  carbohydrates.  1 small apple or orange.  1 cup watermelon or strawberries.   cup applesauce (no sugar added).  2 tbs raisins.   banana.   cup canned fruit, packed in water, its own juice, or sweetened with a sugar substitute.   cup unsweetened fruit juice. Dairy Each serving equals 12 to 15 grams of carbohydrates.  1 cup fat-free milk.  6 oz artificially sweetened yogurt or plain yogurt.  1 cup low-fat buttermilk.  1 cup soy milk.  1 cup almond milk. Meat/Protein  1 large egg.  2 to 3 oz meat, poultry, or fish.   cup low-fat cottage cheese.  1 tbs peanut butter.  1 oz low-fat cheese.   cup tuna in water.   cup tofu. Fat  1 tsp oil.  1 tsp trans-fat-free margarine.  1 tsp butter.  1 tsp mayonnaise.  2 tbs avocado.  1 tbs salad dressing.  1 tbs cream cheese.  2 tbs sour cream.  SAMPLE 1800 CALORIE DIET PLAN Breakfast   cup unsweetened cereal (1 carb serving).  1 cup fat-free milk (1 carb serving).  1 slice whole-wheat toast (1 carb serving).   small banana (1 carb serving).  1 scrambled egg.  1 tsp trans-fat-free margarine. Lunch  Tuna sandwich.  2 slices whole-wheat bread (2 carb servings).   cup canned tuna in water, drained.  1 tbs reduced fat mayonnaise.  1 stalk celery, chopped.  2 slices tomato.  1 lettuce leaf.  1 cup carrot sticks.  24 to 30 seedless grapes (2 carb servings).  6 oz light yogurt (1 carb serving). Afternoon Snack  3 graham cracker squares (1 carb serving).  Fat-free milk, 1 cup (1 carb serving).  1 tbs peanut butter. Dinner  3 oz salmon, broiled with 1 tsp oil.  1 cup mashed potatoes (2 carb servings) with 1 tsp trans-fat-free margarine.  1 cup fresh or frozen green beans.  1 cup steamed asparagus.  1 cup fat-free milk (1 carb serving). Evening Snack  3 cups air-popped popcorn (1 carb serving).  2 tbs parmesan cheese sprinkled on top.  MEAL PLAN Use this worksheet  to help you make a daily meal plan based on the 1800 calorie diet suggestions. If you are using this plan to help you control your blood glucose, you may interchange carbohydrate-containing foods (dairy, starches, and fruits). Select a variety of fresh foods of varying colors and flavors. The total amount of carbohydrate in your meals or snacks is more important than making sure you include all of the food groups every time you eat. Choose from the following foods to build your day's meals:  8 Starches.  4 Vegetables.  3 Fruits.  2 Dairy.  6 to 7 oz Meat/Protein.  Up to 4 Fats.  Your dietician can use this worksheet to help you decide how many servings and which types of foods are right for you. BREAKFAST Food Group and Servings / Food Choice Starch ________________________________________________________ Dairy _________________________________________________________ Fruit _________________________________________________________ Meat/Protein __________________________________________________ Fat ___________________________________________________________ LUNCH Food Group and Servings / Food Choice Starch ________________________________________________________ Meat/Protein __________________________________________________ Vegetable _____________________________________________________ Fruit _________________________________________________________ Dairy _________________________________________________________ Fat ___________________________________________________________ Aura FeyAFTERNOON SNACK Food Group and Servings / Food Choice Starch ________________________________________________________ Meat/Protein __________________________________________________ Fruit __________________________________________________________ Dairy _________________________________________________________ Laural GoldenINNER Food Group and Servings / Food Choice Starch  _________________________________________________________ Meat/Protein ___________________________________________________ Dairy __________________________________________________________ Vegetable ______________________________________________________ Fruit ___________________________________________________________ Fat ____________________________________________________________ Lollie SailsEVENING SNACK Food Group and Servings / Food Choice Fruit __________________________________________________________ Meat/Protein ___________________________________________________ Dairy __________________________________________________________ Starch _________________________________________________________ DAILY TOTALS Starch ____________________________ Vegetable _________________________ Fruit _____________________________ Dairy _____________________________ Meat/Protein______________________ Fat _______________________________ Document Released: 07/04/2005 Document Revised: 03/05/2012 Document Reviewed: 10/28/2011 Exit[employer] Patient Information 2013 [employer].

## 2016-10-28 NOTE — Addendum Note (Signed)
Addended by: Jac CanavanYSINGER, DAVID S on: 10/28/2016 11:30 AM   Modules accepted: Orders

## 2016-10-28 NOTE — Progress Notes (Addendum)
Subjective: Chief Complaint  Patient presents with  . blood suger    pt blood suger running high it was at 500 yesterday , pt is not a dm    Here for hyperglycemia.  He went for DOT exam yesterday with [facility], and there were several concerns.  OSA but hasn't used CPAP since a year ago after CPAP broke.  Last sleep study 7 years ago he thinks  apparently glucose was 500 yesterday.  He was here back in 05/2016, not compliant with BP medication then.   Feet stays cold, but not numb.  No chest pain, no dyspnea.   He does note losing 30+ pounds since last visit here.   No current polydipsia or polyuria.    Passed vision on DOT exam yesterday.    He notes work related injury 01/2016 this year, left knee injury s/p surgery.  Improved, but can't squat or stoop down due to knee pain.  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  Gets yearly DOT due to CPAP.  Had ticket yesterday, out of date on license.  Was given 45 day extension yesterday.    Past Medical History:  Diagnosis Date  . Bell's palsy    at 6th or 7th grade on right side   . Depression   . Diabetes mellitus without complication ([place])    0350  . Elevated LFTs 2017  . Hypertension   . Obesity   . OSA (obstructive sleep apnea)    Current Outpatient Prescriptions on File Prior to Visit  Medication Sig Dispense Refill  . albuterol (PROVENTIL HFA;VENTOLIN HFA) 108 (90 Base) MCG/ACT inhaler Inhale 2 puffs into the lungs every 6 (six) hours as needed for wheezing or shortness of breath. (Patient not taking: Reported on 10/28/2016) 1 Inhaler 0   No current facility-administered medications on file prior to visit.    ROS as in subjective    Objective: BP 136/82   Pulse 82   Wt (!) 332 lb (150.6 kg)   SpO2 98%   BMI 40.41 kg/m   Wt Readings from Last 3 Encounters:  10/28/16 (!) 332 lb (150.6 kg)  06/22/16 (!) 365 lb 9.6 oz (165.8 kg)  02/24/16 (!) 366 lb (166 kg)    BP Readings from Last 3 Encounters:  10/28/16 136/82  06/22/16 (!) 144/80  02/24/16 (!) 150/94   Gen: wd, wn, nad Heart RRR, normal s1, s2, no murmurs Lungs clear Ext: no edema Pulses 2+ UE Neuro: nonfocal exam  Diabetic Foot Exam - Simple   Simple Foot Form Diabetic Foot exam was performed with the following findings:  Yes 10/28/2016 11:12 AM  Visual Inspection See comments:  Yes Sensation Testing Intact to touch and monofilament testing bilaterally:  Yes Pulse Check See comments:  Yes Comments 1+ pedal pulses, intact on monofilament exam, feet warm, distal legs just before ankle with scattered 44m round dot appearing, somewhat petechial appearing flat      Assessment: Encounter Diagnoses  Name Primary?  . Type 2 diabetes mellitus with complication, without long-term current use of insulin ([place]  . Hypertension associated with diabetes ([place]   . Smoker   . Elevated LFTs   . OSA (obstructive sleep apnea)   . Hyperglycemia     Plan: Glad to see he has lost weight, but unfortunately his glucose is elevated and its questionable whether his CPAP is working properly Diabetes - medications recommendations, begin Janumet  samples 50/1000mg BID, gave glucometer kit and discussed proper use Advised he should have updated sleep study particular if he will need to new equipment Elevated LFTs - labs today Will begin medication pending labs today for diabetes  Andre [name] was seen today for blood suger.  Diagnoses and all orders for this visit:  Type 2 diabetes mellitus with complication, without long-term current use of insulin (HCC) -     Comprehensive metabolic panel -     CBC -     Lipid panel -     TSH -     Hemoglobin A1c -     Microalbumin / creatinine urine ratio -     HM DIABETES FOOT EXAM -     HM DIABETES EYE EXAM -     Insulin, random -     C-peptide  Hypertension associated with diabetes (HCC) -     Comprehensive metabolic panel -     CBC -     Lipid  panel  Smoker -     Comprehensive metabolic panel -     CBC -     Lipid panel  Elevated LFTs -     Comprehensive metabolic panel  OSA (obstructive sleep apnea)  Hyperglycemia -     Comprehensive metabolic panel -     Hemoglobin A1c -     Microalbumin / creatinine urine ratio  Other orders -     sitaGLIPtin-metformin (JANUMET) 50-1000 MG tablet; Take 1 tablet by mouth 2 (two) times daily with a meal.

## 2016-10-28 NOTE — Addendum Note (Signed)
Addended by: Winn JockVALENTINE, Najeh Credit N on: 10/28/2016 12:11 PM   Modules accepted: Orders

## 2016-10-29 ENCOUNTER — Other Ambulatory Visit: Payer: Self-pay | Admitting: Medical

## 2016-10-29 LAB — COMPREHENSIVE METABOLIC PANEL
ALBUMIN: 4.1 g/dL (ref 3.6–5.1)
ALK PHOS: 88 U/L (ref 40–115)
ALT: 62 U/L — AB (ref 9–46)
AST: 32 U/L (ref 10–40)
BUN: 10 mg/dL (ref 7–25)
CALCIUM: 9.4 mg/dL (ref 8.6–10.3)
CO2: 24 mmol/L (ref 20–31)
Chloride: 100 mmol/L (ref 98–110)
Creat: 1.01 mg/dL (ref 0.60–1.35)
GLUCOSE: 401 mg/dL — AB (ref 65–99)
POTASSIUM: 4.1 mmol/L (ref 3.5–5.3)
Sodium: 136 mmol/L (ref 135–146)
Total Bilirubin: 0.6 mg/dL (ref 0.2–1.2)
Total Protein: 6.5 g/dL (ref 6.1–8.1)

## 2016-10-29 LAB — MICROALBUMIN / CREATININE URINE RATIO
Creatinine, Urine: 76 mg/dL (ref 20–370)
MICROALB UR: 0.7 mg/dL
MICROALB/CREAT RATIO: 9 ug/mg{creat} (ref <30)

## 2016-10-29 LAB — LIPID PANEL
CHOL/HDL RATIO: 6.3 ratio — AB (ref <=5.0)
CHOLESTEROL: 150 mg/dL (ref 125–200)
HDL: 24 mg/dL — AB (ref >=40)
LDL Cholesterol: 85 mg/dL (ref <130)
TRIGLYCERIDES: 207 mg/dL — AB (ref <150)
VLDL: 41 mg/dL — AB (ref <30)

## 2016-10-29 LAB — INSULIN, RANDOM: Insulin: 34.6 u[IU]/mL — ABNORMAL HIGH (ref 2.0–19.6)

## 2016-10-29 LAB — C-PEPTIDE: C PEPTIDE: 5.22 ng/mL — AB (ref 0.80–3.85)

## 2016-10-29 MED ORDER — PRAVASTATIN SODIUM 20 MG PO TABS: 20.0000 mg | ORAL_TABLET | Freq: Every day | ORAL | 1 refills | Status: DC

## 2016-10-29 MED ORDER — LIRAGLUTIDE 18 MG/3ML ~~LOC~~ SOPN: 1.8000 mg | PEN_INJECTOR | Freq: Every day | SUBCUTANEOUS | 5 refills | Status: DC

## 2016-10-30 ENCOUNTER — Telehealth: Payer: Self-pay | Admitting: Medical

## 2016-10-30 NOTE — Telephone Encounter (Signed)
P.A. JANUMET  

## 2016-11-02 ENCOUNTER — Telehealth: Payer: Self-pay | Admitting: Medical

## 2016-11-02 NOTE — Telephone Encounter (Signed)
Pt called for clarification on dosage of Victoza and also wanted to know about injection sites & how often to check his BS.  Per Dorinda Hillarsha pt can inject in stomach or thigh & also needs to check BS in morning fasting then 2 hours after eats.  Pt informed of this.   Also [facility]

## 2016-11-02 NOTE — Telephone Encounter (Signed)
Also received P.A. For Victoza & Janumet both were ran under workers [employer] insurance.

## 2016-11-03 ENCOUNTER — Other Ambulatory Visit: Payer: Self-pay

## 2016-11-03 MED ORDER — GLUCOSE BLOOD VI STRP: ORAL_STRIP | 3 refills | Status: DC

## 2016-11-03 MED ORDER — MICROLET LANCETS MISC: 3 refills | Status: DC

## 2016-11-03 NOTE — Telephone Encounter (Signed)
Pt needed both lancets and test strips. Pt has an contour next ez meter

## 2016-11-03 NOTE — Telephone Encounter (Signed)
Called to see if he needed lacents and test strips

## 2016-11-12 ENCOUNTER — Telehealth: Payer: Self-pay | Admitting: Medical

## 2016-11-12 NOTE — Telephone Encounter (Signed)
P.A. Thera FlakeJanumet Completed with pt's correct insurance

## 2016-11-12 NOTE — Telephone Encounter (Signed)
P.A. VICTOZA 

## 2016-11-14 ENCOUNTER — Other Ambulatory Visit: Payer: Self-pay

## 2016-11-14 MED ORDER — SITAGLIPTIN PHOS-METFORMIN HCL 50-1000 MG PO TABS: 1.0000 | ORAL_TABLET | Freq: Two times a day (BID) | ORAL | 0 refills | Status: DC

## 2016-11-26 NOTE — Telephone Encounter (Signed)
P.A. Janumet denied states pt needs 3 month trial of failure of Jentaueto and Kombiglyze and Kazano.  Do you want to switch?   

## 2016-11-26 NOTE — Telephone Encounter (Signed)
P.A. Denied Plan only allows them to receive #6 ml (2 pens)  1.2 mg once daily dosing unless that has been failed then documentation of failure must be submitted then should be about to be approved for additional dosage  So is it ok for lesser dosage?

## 2016-11-28 ENCOUNTER — Other Ambulatory Visit: Payer: Self-pay | Admitting: Medical

## 2016-11-28 MED ORDER — LIRAGLUTIDE 18 MG/3ML ~~LOC~~ SOPN: 1.2000 mg | PEN_INJECTOR | Freq: Every day | SUBCUTANEOUS | 5 refills | Status: DC

## 2016-11-28 MED ORDER — ALOGLIPTIN-METFORMIN HCL 12.5-1000 MG PO TABS: 1.0000 | ORAL_TABLET | Freq: Two times a day (BID) | ORAL | 5 refills | Status: DC

## 2016-11-28 NOTE — Telephone Encounter (Signed)
Fine, do whatever we got to do to get approval.   I sent different script but may need to call pharmacy to verify it went through at the approved amount.

## 2016-11-28 NOTE — Telephone Encounter (Signed)
I sent Kazano as alternate.  It seems like the time frame for these notices to come is taking a long time in reference to when I prescribed the medication before the PA.   Why is insurance taking so long.  The patient suffers when they don't have the medication!!!

## 2016-12-01 NOTE — Telephone Encounter (Signed)
Called pharmacy to see if went thru and pt's insurance had been cancelled.  Called pt and he states he ins has been cancelled and he doesn't know why.  He has contacted his attorney.  Samples were given yesterday to hold pt.

## 2016-12-01 NOTE — Telephone Encounter (Signed)
Called pharmacy to see if Andre [name] went thru give info on discount card and pt's insurance had been cancelled.  Called pt and he states he ins has been cancelled and he doesn't know why.  He has contacted his attorney.  Samples Janumet were given yesterday to hold pt.

## 2016-12-23 ENCOUNTER — Other Ambulatory Visit: Payer: Self-pay

## 2016-12-23 ENCOUNTER — Telehealth: Payer: Self-pay | Admitting: Medical

## 2016-12-23 MED ORDER — LIRAGLUTIDE 18 MG/3ML ~~LOC~~ SOPN: 1.8000 mg | PEN_INJECTOR | Freq: Two times a day (BID) | SUBCUTANEOUS | 0 refills | Status: DC

## 2016-12-23 MED ORDER — SITAGLIPTIN PHOS-METFORMIN HCL 50-1000 MG PO TABS: 1.0000 | ORAL_TABLET | Freq: Two times a day (BID) | ORAL | 0 refills | Status: DC

## 2016-12-23 NOTE — Telephone Encounter (Signed)
Called and spoke with patient, left sample for him

## 2016-12-23 NOTE — Telephone Encounter (Signed)
Patient called requesting samples of  victoza and janumet

## 2016-12-23 NOTE — Telephone Encounter (Signed)
I don't know if we have samples of either but check  Was there an issue with pharmacy or insurance, were the 2 medications not covered?  He is also due for f/u at this time on the new med changes (new diabetic with out of control sugars).   Make sure he has been taking them since last phone message in November

## 2017-11-02 ENCOUNTER — Telehealth: Payer: Self-pay | Admitting: Internal Medicine

## 2017-11-02 ENCOUNTER — Telehealth: Payer: Self-pay | Admitting: Medical

## 2017-11-02 NOTE — Telephone Encounter (Signed)
[facility]

## 2017-11-02 NOTE — Telephone Encounter (Signed)
Tried to call pt but mailbox full. Pt is overdue for an appointment. Pt needs to schedule

## 2017-11-02 NOTE — Telephone Encounter (Signed)
Pt called and stated he had a missed call from us. Looking at chart Martie LeeSabrina had called pt because he needed an appt. Pt declined to make one. He states he no longer has insurance and is no longer taking any of his meds. He could not afford them. Sending back to advise.

## 2017-11-02 NOTE — Telephone Encounter (Signed)
noted 

## 2017-11-03 NOTE — Telephone Encounter (Signed)
Pt was notified to to call [facility]

## 2017-11-03 NOTE — Telephone Encounter (Signed)
Once he gets insurance advised he can come back to the office for his care

## 2018-03-06 ENCOUNTER — Ambulatory Visit: Payer: Self-pay | Admitting: Medical

## 2018-03-06 ENCOUNTER — Encounter: Payer: Self-pay | Admitting: Medical

## 2018-03-06 VITALS — BP 130/82 | HR 84 | Wt 319.4 lb

## 2018-03-06 DIAGNOSIS — E118 Type 2 diabetes mellitus with unspecified complications: Secondary | ICD-10-CM

## 2018-03-06 DIAGNOSIS — G4733 Obstructive sleep apnea (adult) (pediatric): Secondary | ICD-10-CM

## 2018-03-06 DIAGNOSIS — Z9119 Patient's noncompliance with other medical treatment and regimen: Secondary | ICD-10-CM

## 2018-03-06 DIAGNOSIS — Z91199 Patient's noncompliance with other medical treatment and regimen due to unspecified reason: Secondary | ICD-10-CM | POA: Insufficient documentation

## 2018-03-06 MED ORDER — GLIPIZIDE 5 MG PO TABS: 5.0000 mg | ORAL_TABLET | Freq: Every day | ORAL | 2 refills | Status: DC

## 2018-03-06 MED ORDER — METFORMIN HCL 500 MG PO TABS: 500.0000 mg | ORAL_TABLET | Freq: Two times a day (BID) | ORAL | 2 refills | Status: DC

## 2018-03-06 NOTE — Progress Notes (Signed)
Subjective: Chief Complaint  Patient presents with  . discuss A1C   Here for discussion of diabetes.  Of note his last visit here was November 2017, over a year ago.  At his last visit in 2017 we had discussed getting back on CPAP or at least making sure his machine is working properly.  We began him on Janumet, Victoza injection and gave a glucometer to start checking sugars.  We had also recommended a statin Pravachol at that time.  We had recommended diet and exercise changes.  He was supposed to have follow-up in 1 month.  He never came back til now.  (I reviewed the hemoglobin A1c test from 02/27/18 it was 8.2%)  He is currently not taking any medicine.  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   Since last visit lost insurance, got off track.  He does check sugars ever now and then gets mostly 140-160s, but occasionally in the 300s.    Since last visit has made efforts with diet and exercise to lose weight.  Eats a lot of vegetables.   Using CPAP  In recent months has felt great, not like he was last visit.  He says he was in a bad place a year ago.   Past Medical History:  Diagnosis Date  . Bell's palsy    at 6th or 7th grade on right side   . Depression   . Diabetes mellitus without complication (HCC)    2017  . Elevated LFTs 2017  . Hypertension   . Obesity   . OSA (obstructive sleep apnea)    Current Outpatient Medications on File Prior to Visit  Medication Sig Dispense Refill  . albuterol (PROVENTIL HFA;VENTOLIN HFA) 108 (90 Base) MCG/ACT inhaler Inhale 2 puffs into the lungs every 6 (six) hours as needed for wheezing or shortness of breath. (Patient not taking: Reported on 10/28/2016) 1 Inhaler 0  . Alogliptin-Metformin HCl 12.04-999 MG TABS Take 1 tablet by mouth 2 (two) times daily. 60 tablet 5  . glucose blood (BAYER CONTOUR NEXT TEST) test strip Test twice a day as needed. Pt uses contour next ez meter (Patient not  taking: Reported on 03/06/2018) 100 each 3  . liraglutide (VICTOZA) 18 MG/3ML SOPN Inject 0.2 mLs (1.2 mg total) into the skin at bedtime. (Patient not taking: Reported on 03/06/2018) 3 mL 5  . liraglutide (VICTOZA) 18 MG/3ML SOPN Inject 0.3 mLs (1.8 mg total) into the skin 2 (two) times daily. (Patient not taking: Reported on 03/06/2018) 1 pen 0  . MICROLET LANCETS MISC Test daily (Patient not taking: Reported on 03/06/2018) 100 each 3  . pravastatin (PRAVACHOL) 20 MG tablet Take 1 tablet (20 mg total) by mouth daily. (Patient not taking: Reported on 03/06/2018) 90 tablet 1  . sitaGLIPtin-metformin (JANUMET) 50-1000 MG tablet Take 1 tablet by mouth 2 (two) times daily with a meal. (Patient not taking: Reported on 03/06/2018) 60 tablet 2  . sitaGLIPtin-metformin (JANUMET) 50-1000 MG tablet Take 1 tablet by mouth 2 (two) times daily with a meal. (Patient not taking: Reported on 03/06/2018) 14 tablet 0  . sitaGLIPtin-metformin (JANUMET) 50-1000 MG tablet Take 1 tablet by mouth 2 (two) times daily with a meal. (Patient not taking: Reported on 03/06/2018) 42 tablet 0   No current facility-administered medications on file prior to visit.    Family History  Problem Relation Age of Onset  . Heart disease Mother 15  . Diabetes Mother   .  Diabetes Father        IDDM, amputation  . Kidney disease Father        kidney transplant  . Cancer Father     ROS as in subjective   Objective: BP 130/82   Pulse 84   Wt (!) 319 lb 6.4 oz (144.9 kg)   SpO2 97%   BMI 38.88 kg/m   Wt Readings from Last 3 Encounters:  03/06/18 (!) 319 lb 6.4 oz (144.9 kg)  10/28/16 (!) 332 lb (150.6 kg)  06/22/16 (!) 365 lb 9.6 oz (165.8 kg)   General appearence: alert, no distress, WD/WN,  HEENT: normocephalic, sclerae anicteric, TMs pearly, nares patent, no discharge or erythema, pharynx normal Oral cavity: MMM, no lesions Neck: supple, no lymphadenopathy, no thyromegaly, no masses Heart: RRR, normal S1, S2, no  murmurs Lungs: CTA bilaterally, no wheezes, rhonchi, or rales Pulses: 2+ symmetric, upper and lower extremities, normal cap refill     Assessment: Encounter Diagnoses  Name Primary?  . Type 2 diabetes mellitus with complication, without long-term current use of insulin (HCC) Yes  . OSA (obstructive sleep apnea)   . Noncompliance        Plan: We discussed his diagnoses, compliance, counseled on diet and exercise, discussed need for follow-up.  Discussed possible complications of uncontrolled diabetes.  We discussed the recommendations below and I will see him back in 1 month.  Recommendations:  Begin Glucotrol 5 mg tablet 1 tablet daily in the morning for diabetes  After 1 week of being on Glucotrol if you are not having any side effects, then add back Metformin 500 mg 1 tablet daily in the morning as well  Metformin can cause nausea upset stomach or diarrhea.  If you start having the side effects right away let me know.  However most people do fine with low-dose metformin once a day  If you start taking Glucotrol and Metformin, and after 2 or 3 weeks you are doing fine on this, you can go up to twice daily on metformin.  Continue Glucotrol once daily.  I do need you to check your blood sugars in the morning fasting at least 2-3 days/week.  The goal is to have your blood sugar between 70 and 130 fasting in the morning  Glucotrol can drop your sugar too low.  So if you happen to see a glucose reading under 70, or if you feel shaky, sweaty, clammy or lightheaded, check your sugar to make sure it is not too low.  I recommend you exercise at least 150 minutes a week such as 30 minutes 5 times a week  Follow up in 1 month  I recommend exercising most days of the week using a type of exercise that they would enjoy and stick to such as walking, running, swimming, hiking, biking, aerobics, etc.  I recommend a healthy diet.    Do's:   whole grains such as whole grain pasta, rice,  whole grains breads and whole grain cereals.  Use small quantities such as 1/2 cup per serving or 2 slices of bread per serving.    Eat 3-5 fruits daily  Eat beans at least once daily  Eat almonds in small quantities at least 3 days per week    If they eat meat, I recommend small portions of lean meats such as chicken, fish, and [place].  Eat as much NON corn and NON potato vegetables as they like, particularly raw or steamed  Drink several large glasses of water daily  Cautions:  Limit red meat  Limit corn and potatoes  Limit sweets, cake, pie, candy  Limit beer and alcohol  Avoid fried food, fast food, large portions  Avoid sugary drinks such as regular soda and sweet tea  Ashur was seen today for discuss a1c.  Diagnoses and all orders for this visit:  Type 2 diabetes mellitus with complication, without long-term current use of insulin (HCC)  OSA (obstructive sleep apnea)  Noncompliance

## 2018-03-06 NOTE — Patient Instructions (Signed)
Recommendations:  Begin Glucotrol 5 mg tablet 1 tablet daily in the morning for diabetes  After 1 week of being on Glucotrol if you are not having any side effects, then add back Metformin 500 mg 1 tablet daily in the morning as well  Metformin can cause nausea upset stomach or diarrhea.  If you start having the side effects right away let me know.  However most people do fine with low-dose metformin once a day  If you start taking Glucotrol and Metformin, and after 2 or 3 weeks you are doing fine on this, you can go up to twice daily on metformin.  Continue Glucotrol once daily.  I do need you to check your blood sugars in the morning fasting at least 2-3 days/week.  The goal is to have your blood sugar between 70 and 130 fasting in the morning  Glucotrol can drop your sugar too low.  So if you happen to see a glucose reading under 70, or if you feel shaky, sweaty, clammy or lightheaded, check your sugar to make sure it is not too low.  I recommend you exercise at least 150 minutes a week such as 30 minutes 5 times a week  Follow up in 1 month  I recommend exercising most days of the week using a type of exercise that they would enjoy and stick to such as walking, running, swimming, hiking, biking, aerobics, etc.  I recommend a healthy diet.    Do's:   whole grains such as whole grain pasta, rice, whole grains breads and whole grain cereals.  Use small quantities such as 1/2 cup per serving or 2 slices of bread per serving.    Eat 3-5 fruits daily  Eat beans at least once daily  Eat almonds in small quantities at least 3 days per week    If they eat meat, I recommend small portions of lean meats such as chicken, fish, and [place].  Eat as much NON corn and NON potato vegetables as they like, particularly raw or steamed  Drink several large glasses of water daily  Cautions:  Limit red meat  Limit corn and potatoes  Limit sweets, cake, pie, candy  Limit beer and  alcohol  Avoid fried food, fast food, large portions  Avoid sugary drinks such as regular soda and sweet tea    Hypoglycemia Hypoglycemia is when the sugar (glucose) level in the blood is too low. Symptoms of low blood sugar may include:  Feeling: ? Hungry. ? Worried or nervous (anxious). ? Sweaty and clammy. ? Confused. ? Dizzy. ? Sleepy. ? Sick to your stomach (nauseous).  Having: ? A fast heartbeat. ? A headache. ? A change in your vision. ? Jerky movements that you cannot control (seizure). ? Nightmares. ? Tingling or no feeling (numbness) around the mouth, lips, or tongue.  Having trouble with: ? Talking. ? Paying attention (concentrating). ? Moving (coordination). ? Sleeping.  Shaking.  Passing out (fainting).  Getting upset easily (irritability).  Low blood sugar can happen to people who have diabetes and people who do not have diabetes. Low blood sugar can happen quickly, and it can be an emergency. Treating Low Blood Sugar Low blood sugar is often treated by eating or drinking something sugary right away. If you can think clearly and swallow safely, follow the 15:15 rule:  Take 15 grams of a fast-acting carb (carbohydrate). Some fast-acting carbs are: ? 1 tube of glucose gel. ? 3 sugar tablets (glucose pills). ? 6-8 pieces  of hard candy. ? 4 oz (120 mL) of fruit juice. ? 4 oz (120 mL) of regular (not diet) soda.  Check your blood sugar 15 minutes after you take the carb.  If your blood sugar is still at or below 70 mg/dL (3.9 mmol/L), take 15 grams of a carb again.  If your blood sugar does not go above 70 mg/dL (3.9 mmol/L) after 3 tries, get help right away.  After your blood sugar goes back to normal, eat a meal or a snack within 1 hour.  Treating Very Low Blood Sugar If your blood sugar is at or below 54 mg/dL (3 mmol/L), you have very low blood sugar (severe hypoglycemia). This is an emergency. Do not wait to see if the symptoms will go away.  Get medical help right away. Call your local emergency services (911 in the [place] Do not drive yourself to the hospital. If you have very low blood sugar and you cannot eat or drink, you may need a glucagon shot (injection). A family member or friend should learn how to check your blood sugar and how to give you a glucagon shot. Ask your doctor if you need to have a glucagon shot kit at home. Follow these instructions at home: General instructions  Avoid any diets that cause you to not eat enough food. Talk with your doctor before you start any new diet.  Take over-the-counter and prescription medicines only as told by your doctor.  Limit alcohol to no more than 1 drink per day for nonpregnant women and 2 drinks per day for men. One drink equals 12 oz of beer, 5 oz of wine, or 1 oz of hard liquor.  Keep all follow-up visits as told by your doctor. This is important. If You Have Diabetes:   Make sure you know the symptoms of low blood sugar.  Always keep a source of sugar with you, such as: ? Sugar. ? Sugar tablets. ? Glucose gel. ? Fruit juice. ? Regular soda (not diet soda). ? Milk. ? Hard candy. ? Honey.  Take your medicines as told.  Follow your exercise and meal plan. ? Eat on time. Do not skip meals. ? Follow your sick day plan when you cannot eat or drink normally. Make this plan ahead of time with your doctor.  Check your blood sugar as often as told by your doctor. Always check before and after exercise.  Share your diabetes care plan with: ? Your work or school. ? People you live with.  Check your pee (urine) for ketones: ? When you are sick. ? As told by your doctor.  Carry a card or wear jewelry that says you have diabetes. If You Have Low Blood Sugar From Other Causes:   Check your blood sugar as often as told by your doctor.  Follow instructions from your doctor about what you cannot eat or drink. Contact a doctor if:  You have trouble keeping your  blood sugar in your target range.  You have low blood sugar often. Get help right away if:  You still have symptoms after you eat or drink something sugary.  Your blood sugar is at or below 54 mg/dL (3 mmol/L).  You have jerky movements that you cannot control.  You pass out. These symptoms may be an emergency. Do not wait to see if the symptoms will go away. Get medical help right away. Call your local emergency services (911 in the [place] Do not drive yourself to the hospital.  This information is not intended to replace advice given to you by your health care provider. Make sure you discuss any questions you have with your health care provider. Document Released: 03/08/2010 Document Revised: 05/19/2016 Document Reviewed: 01/15/2016 Elsevier Interactive Patient Education  [employer].

## 2018-03-08 ENCOUNTER — Telehealth: Payer: Self-pay | Admitting: Family Medicine

## 2018-03-08 NOTE — Telephone Encounter (Signed)
Apolinar JunesBrandon called back and I advised of DOT issue and will revisit in a minimum of 1 month.

## 2018-03-08 NOTE — Telephone Encounter (Signed)
Patient called and wanted to come in tomorrow for a DOT cpe, but he wanted me to ask if he would pass first. Vincenza HewsShane advised he just saw him this week and he has got to start his meds back on a regular basis and get his blood sugars under control.  Tried to call him back, reached voice mail that was full.

## 2018-03-09 ENCOUNTER — Encounter: Payer: Self-pay | Admitting: Medical

## 2018-04-09 ENCOUNTER — Encounter: Payer: Self-pay | Admitting: Medical

## 2018-04-09 ENCOUNTER — Ambulatory Visit (INDEPENDENT_AMBULATORY_CARE_PROVIDER_SITE_OTHER): Payer: Self-pay | Admitting: Medical

## 2018-04-09 ENCOUNTER — Ambulatory Visit: Payer: Self-pay | Admitting: Medical

## 2018-04-09 VITALS — BP 138/90 | HR 78 | Ht 75.0 in | Wt 332.4 lb

## 2018-04-09 DIAGNOSIS — E1159 Type 2 diabetes mellitus with other circulatory complications: Secondary | ICD-10-CM

## 2018-04-09 DIAGNOSIS — I10 Essential (primary) hypertension: Secondary | ICD-10-CM

## 2018-04-09 DIAGNOSIS — E118 Type 2 diabetes mellitus with unspecified complications: Secondary | ICD-10-CM

## 2018-04-09 MED ORDER — METFORMIN HCL 1000 MG PO TABS: 1000.0000 mg | ORAL_TABLET | Freq: Two times a day (BID) | ORAL | 1 refills | Status: DC

## 2018-04-09 MED ORDER — GLIPIZIDE 5 MG PO TABS: 5.0000 mg | ORAL_TABLET | Freq: Every day | ORAL | 1 refills | Status: DC

## 2018-04-09 MED ORDER — PRAVASTATIN SODIUM 20 MG PO TABS: 20.0000 mg | ORAL_TABLET | Freq: Every day | ORAL | 1 refills | Status: DC

## 2018-04-09 NOTE — Progress Notes (Signed)
Subjective: Chief Complaint  Patient presents with  . Follow-up    diabetes check, sugars are improving   Here for diabetes recheck.    Taking plain Metformin 500mg  BID most days. Sometimes forgets the second dose.   Taking Glucotrol 5mg  daily. Checking glucose some, not great about checking daily.   Sees higher morning numbers.   Gets 90s-100s in evening.  In morning has seen 180-190 at times.  One morning was 110.  No polyuria, no polydipsia, no blurred vision.   Trying to get insurance, but having trouble finding reasonably priced policy.    No other aggravating or relieving factors. No other complaint.   Past Medical History:  Diagnosis Date  . Bell's palsy    at 6th or 7th grade on right side   . Depression   . Diabetes mellitus without complication (HCC)    2017  . Elevated LFTs 2017  . Hypertension   . Obesity   . OSA (obstructive sleep apnea)    Current Outpatient Medications on File Prior to Visit  Medication Sig Dispense Refill  . albuterol (PROVENTIL HFA;VENTOLIN HFA) 108 (90 Base) MCG/ACT inhaler Inhale 2 puffs into the lungs every 6 (six) hours as needed for wheezing or shortness of breath. (Patient not taking: Reported on 10/28/2016) 1 Inhaler 0  . glucose blood (BAYER CONTOUR NEXT TEST) test strip Test twice a day as needed. Pt uses contour next ez meter (Patient not taking: Reported on 03/06/2018) 100 each 3  . MICROLET LANCETS MISC Test daily (Patient not taking: Reported on 03/06/2018) 100 each 3   No current facility-administered medications on file prior to visit.    ROS as in subjective    Objective: BP 138/90 (BP Location: Right Arm, Patient Position: Sitting, Cuff Size: Normal)   Pulse 78   Ht 6\' 3"  (1.905 m)   Wt (!) 332 lb 6.4 oz (150.8 kg)   SpO2 96%   BMI 41.55 kg/m   BP Readings from Last 3 Encounters:  04/09/18 138/90  03/06/18 130/82  10/28/16 136/82   Wt Readings from Last 3 Encounters:  04/09/18 (!) 332 lb 6.4 oz (150.8 kg)  03/06/18  (!) 319 lb 6.4 oz (144.9 kg)  10/28/16 (!) 332 lb (150.6 kg)   Gen: wd, wn, nad Otherwise not examined    Assessment: Encounter Diagnoses  Name Primary?  . Type 2 diabetes mellitus with complication, without long-term current use of insulin (HCC) Yes  . Hypertension associated with diabetes (HCC)   . Severe obesity (BMI >= 40) (HCC)      Plan: Diabetes - needs to be more compliant with healthy diet.  He acknowledges worse diet since last visit.  increase Metformin to 1000mg  BID, c/t Glipizide.  Monitor glucose, f/u 85mo fasting  Restart Pravachol for heart disease benefit given his risk factors.  C/t efforts to lose weight, work on lifestyle changes.  Caymen was seen today for follow-up.  Diagnoses and all orders for this visit:  Type 2 diabetes mellitus with complication, without long-term current use of insulin (HCC)  Hypertension associated with diabetes (HCC)  Severe obesity (BMI >= 40) (HCC)  Other orders -     pravastatin (PRAVACHOL) 20 MG tablet; Take 1 tablet (20 mg total) by mouth daily. -     glipiZIDE (GLUCOTROL) 5 MG tablet; Take 1 tablet (5 mg total) by mouth daily before breakfast. -     metFORMIN (GLUCOPHAGE) 1000 MG tablet; Take 1 tablet (1,000 mg total) by mouth 2 (two) times  daily with a meal.

## 2018-10-12 ENCOUNTER — Other Ambulatory Visit: Payer: Self-pay | Admitting: Medical

## 2018-10-12 NOTE — Telephone Encounter (Signed)
Is this ok to refill?  

## 2018-10-16 NOTE — Telephone Encounter (Signed)
Send med refill, but get in for med check fasting.

## 2018-10-16 NOTE — Telephone Encounter (Signed)
Tried to leave message on voicemail, it was full will try to again later.

## 2018-10-17 ENCOUNTER — Encounter: Payer: Self-pay | Admitting: Medical

## 2018-10-17 ENCOUNTER — Ambulatory Visit (INDEPENDENT_AMBULATORY_CARE_PROVIDER_SITE_OTHER): Payer: Self-pay | Admitting: Medical

## 2018-10-17 VITALS — BP 140/90 | HR 82 | Temp 97.6°F | Resp 16 | Ht 75.5 in | Wt 323.2 lb

## 2018-10-17 DIAGNOSIS — R200 Anesthesia of skin: Secondary | ICD-10-CM | POA: Insufficient documentation

## 2018-10-17 DIAGNOSIS — E118 Type 2 diabetes mellitus with unspecified complications: Secondary | ICD-10-CM

## 2018-10-17 DIAGNOSIS — Z7185 Encounter for immunization safety counseling: Secondary | ICD-10-CM

## 2018-10-17 DIAGNOSIS — R945 Abnormal results of liver function studies: Secondary | ICD-10-CM

## 2018-10-17 DIAGNOSIS — R7989 Other specified abnormal findings of blood chemistry: Secondary | ICD-10-CM

## 2018-10-17 DIAGNOSIS — E1159 Type 2 diabetes mellitus with other circulatory complications: Secondary | ICD-10-CM

## 2018-10-17 DIAGNOSIS — I10 Essential (primary) hypertension: Secondary | ICD-10-CM

## 2018-10-17 DIAGNOSIS — F17200 Nicotine dependence, unspecified, uncomplicated: Secondary | ICD-10-CM

## 2018-10-17 DIAGNOSIS — Z7189 Other specified counseling: Secondary | ICD-10-CM

## 2018-10-17 DIAGNOSIS — G4733 Obstructive sleep apnea (adult) (pediatric): Secondary | ICD-10-CM

## 2018-10-17 LAB — COMPREHENSIVE METABOLIC PANEL
ALBUMIN: 4.6 g/dL (ref 3.5–5.5)
ALK PHOS: 74 IU/L (ref 39–117)
ALT: 42 IU/L (ref 0–44)
AST: 21 IU/L (ref 0–40)
Albumin/Globulin Ratio: 2.2 (ref 1.2–2.2)
BUN / CREAT RATIO: 9 (ref 9–20)
BUN: 10 mg/dL (ref 6–20)
Bilirubin Total: 0.5 mg/dL (ref 0.0–1.2)
CO2: 23 mmol/L (ref 20–29)
Calcium: 9.5 mg/dL (ref 8.7–10.2)
Chloride: 103 mmol/L (ref 96–106)
Creatinine, Ser: 1.15 mg/dL (ref 0.76–1.27)
GFR calc Af Amer: 93 mL/min/{1.73_m2} (ref >59)
GFR calc non Af Amer: 81 mL/min/{1.73_m2} (ref >59)
GLUCOSE: 97 mg/dL (ref 65–99)
Globulin, Total: 2.1 g/dL (ref 1.5–4.5)
POTASSIUM: 4.3 mmol/L (ref 3.5–5.2)
Sodium: 141 mmol/L (ref 134–144)
TOTAL PROTEIN: 6.7 g/dL (ref 6.0–8.5)

## 2018-10-17 LAB — LIPID PANEL
Chol/HDL Ratio: 3.3 ratio (ref 0.0–5.0)
Cholesterol, Total: 112 mg/dL (ref 100–199)
HDL: 34 mg/dL — ABNORMAL LOW (ref >39)
LDL Calculated: 69 mg/dL (ref 0–99)
Triglycerides: 44 mg/dL (ref 0–149)
VLDL CHOLESTEROL CAL: 9 mg/dL (ref 5–40)

## 2018-10-17 LAB — HEMOGLOBIN A1C
Est. average glucose Bld gHb Est-mCnc: 128 mg/dL
Hgb A1c MFr Bld: 6.1 % — ABNORMAL HIGH (ref 4.8–5.6)

## 2018-10-17 NOTE — Patient Instructions (Addendum)
Recommendations We are checking labs today and we will call with results  Call [facility] about CPAP supplies, [phone]  Continue CPAP use daily  I recommend you check your blood sugars more frequently at least 3 days/week fasting in the morning and anytime you feel shaky or unusual  Goal is to be between 70 and 130 fasting  Continue your current medicines but add Ramipril once daily to help lower blood pressure and to protect the kidney.  We want your blood pressure to be less than 130/80.  I am glad you have lost some weight, continue efforts to lose more weight  I recommend you quit tobacco!   See dentist yearly  See eye doctor yearly and asked him to forward [place] a copy of your diabetic eye exam  Check your feet daily for wounds or sores that are not healing and come in at that the problem.   Vaccine recommendations  It is recommended that you have a flu shot every year  It is recommend you have a one-time pneumonia vaccine    I recommend exercising most days of the week using a type of exercise that they would enjoy and stick to such as walking, running, swimming, hiking, biking, aerobics, etc.  I recommend a healthy diet.    Do's:   whole grains such as whole grain pasta, rice, whole grains breads and whole grain cereals.  Use small quantities such as 1/2 cup per serving or 2 slices of bread per serving.    Eat 3-5 fruits daily  Eat beans at least once daily  Eat almonds in small quantities at least 3 days per week    If they eat meat, I recommend small portions of lean meats such as chicken, fish, and [place].  Eat as much NON corn and NON potato vegetables as they like, particularly raw or steamed  Drink several large glasses of water daily  Cautions:  Limit red meat  Limit corn and potatoes  Limit sweets, cake, pie, candy  Limit beer and alcohol  Avoid fried food, fast food, large portions  Avoid sugary drinks such as regular soda and sweet  tea

## 2018-10-17 NOTE — Telephone Encounter (Signed)
Patient came in for appointment today. 

## 2018-10-17 NOTE — Progress Notes (Signed)
Subjecive: Chief Complaint  Patient presents with  . med check    fasting med check coffee with sugar and cream   Diabetes-compliant with glipizide 5 mg daily, metformin 1000 mg twice daily.   Checking sugars only periodically.  Hyperlipidemia-compliant with Pravachol 20 mg daily  Legs and feet always cold, right toes get numb, for past month or so.  Gets shaky at times, thinks its related to low sugars.  OSA - using CPAP, but strap broke recently.   Exercise - not much, but always working  Eating habits - ok, no fast food.   Tries to stay away from junk food.  Past Medical History:  Diagnosis Date  . Bell's palsy    at 6th or 7th grade on right side   . Depression   . Diabetes mellitus without complication (HCC)    2017  . Elevated LFTs 2017  . Hypertension   . Obesity   . OSA (obstructive sleep apnea)    Current Outpatient Medications on File Prior to Visit  Medication Sig Dispense Refill  . glipiZIDE (GLUCOTROL) 5 MG tablet Take 1 tablet (5 mg total) by mouth daily before breakfast. 90 tablet 1  . metFORMIN (GLUCOPHAGE) 1000 MG tablet TAKE 1 TABLET (1,000 MG TOTAL) BY MOUTH 2 (TWO) TIMES DAILY WITH A MEAL. 180 tablet 0  . pravastatin (PRAVACHOL) 20 MG tablet TAKE 1 TABLET BY MOUTH EVERY DAY 90 tablet 0  . glucose blood (BAYER CONTOUR NEXT TEST) test strip Test twice a day as needed. Pt uses contour next ez meter (Patient not taking: Reported on 03/06/2018) 100 each 3  . MICROLET LANCETS MISC Test daily (Patient not taking: Reported on 03/06/2018) 100 each 3   No current facility-administered medications on file prior to visit.    ROS as in subjective   Objective BP 140/90   Pulse 82   Temp 97.6 F (36.4 C) (Oral)   Resp 16   Ht 6' 3.5" (1.918 m)   Wt (!) 323 lb 3.2 oz (146.6 kg)   SpO2 96%   BMI 39.86 kg/m   Wt Readings from Last 3 Encounters:  10/17/18 (!) 323 lb 3.2 oz (146.6 kg)  04/09/18 (!) 332 lb 6.4 oz (150.8 kg)  03/06/18 (!) 319 lb 6.4 oz (144.9  kg)   BP Readings from Last 3 Encounters:  10/17/18 140/90  04/09/18 138/90  03/06/18 130/82   General appearence: alert, no distress, WD/WN, obese white male Oral cavity: MMM, no lesions Neck: supple, no lymphadenopathy, no thyromegaly, no masses, no bruits Heart: RRR, normal S1, S2, no murmurs Lungs: CTA bilaterally, no wheezes, rhonchi, or rales Pulses: 2+ symmetric, upper and lower extremities, normal cap refill Ext: no edema Neuro: CN2-12 intact, nonfocal exam, normal DTRs  Diabetic Foot Exam - Simple   Simple Foot Form Diabetic Foot exam was performed with the following findings:  Yes 10/17/2018  9:49 AM  Visual Inspection No deformities, no ulcerations, no other skin breakdown bilaterally:  Yes Sensation Testing Intact to touch and monofilament testing bilaterally:  Yes Pulse Check Posterior Tibialis and Dorsalis pulse intact bilaterally:  Yes Comments       Assessment: Encounter Diagnoses  Name Primary?  . Hypertension associated with diabetes (HCC) Yes  . OSA (obstructive sleep apnea)   . Type 2 diabetes with complication (HCC)   . Elevated LFTs   . Smoker   . Vaccine counseling   . Numbness      Plan: Hypertension-begin enalapril, discussed risks and benefits, discussed  goal blood pressure  Sleep apnea-continue CPAP use, gave contact information for [facility] to get his supplies and replace his recently broken strep  Diabetes-needs to be monitoring sugars regularly, continue daily foot checks, yearly eye doctor visit, continue current medications, labs today  Elevated LFTs-labs today  Smoker-counseled on tobacco use cessation.  He is not ready to quit  Vaccine counseling-discussed recommended vaccines  Numbness in feet, likely neuropathy-discussed importance of good blood pressure and blood sugar control, labs today  Advised he try to find a job that has benefits and insurance given his health issues  Patient Instructions  Recommendations We are  checking labs today and we will call with results  Call [facility] about CPAP supplies, [phone]  Continue CPAP use daily  I recommend you check your blood sugars more frequently at least 3 days/week fasting in the morning and anytime you feel shaky or unusual  Goal is to be between 70 and 130 fasting  Continue your current medicines but add Ramipril once daily to help lower blood pressure and to protect the kidney.  We want your blood pressure to be less than 130/80.  I am glad you have lost some weight, continue efforts to lose more weight  I recommend you quit tobacco!   See dentist yearly  See eye doctor yearly and asked him to forward [place] a copy of your diabetic eye exam  Check your feet daily for wounds or sores that are not healing and come in at that the problem.   Vaccine recommendations  It is recommended that you have a flu shot every year  It is recommend you have a one-time pneumonia vaccine    I recommend exercising most days of the week using a type of exercise that they would enjoy and stick to such as walking, running, swimming, hiking, biking, aerobics, etc.  I recommend a healthy diet.    Do's:   whole grains such as whole grain pasta, rice, whole grains breads and whole grain cereals.  Use small quantities such as 1/2 cup per serving or 2 slices of bread per serving.    Eat 3-5 fruits daily  Eat beans at least once daily  Eat almonds in small quantities at least 3 days per week    If they eat meat, I recommend small portions of lean meats such as chicken, fish, and [place].  Eat as much NON corn and NON potato vegetables as they like, particularly raw or steamed  Drink several large glasses of water daily  Cautions:  Limit red meat  Limit corn and potatoes  Limit sweets, cake, pie, candy  Limit beer and alcohol  Avoid fried food, fast food, large portions  Avoid sugary drinks such as regular soda and sweet tea    Andre [name] was seen  today for med check.  Diagnoses and all orders for this visit:  Hypertension associated with diabetes (HCC) -     Hemoglobin A1c -     Lipid panel -     Comprehensive metabolic panel -     CBC -     Vitamin B12  OSA (obstructive sleep apnea)  Type 2 diabetes with complication (HCC) -     Hemoglobin A1c -     Lipid panel -     Comprehensive metabolic panel -     CBC -     Vitamin B12  Elevated LFTs  Smoker  Vaccine counseling  Numbness -     CBC -  Vitamin B12

## 2018-10-18 ENCOUNTER — Other Ambulatory Visit: Payer: Self-pay | Admitting: Medical

## 2018-10-18 LAB — CBC
HEMATOCRIT: 52.1 % — AB (ref 37.5–51.0)
Hemoglobin: 17.8 g/dL — ABNORMAL HIGH (ref 13.0–17.7)
MCH: 31 pg (ref 26.6–33.0)
MCHC: 34.2 g/dL (ref 31.5–35.7)
MCV: 91 fL (ref 79–97)
PLATELETS: 267 10*3/uL (ref 150–450)
RBC: 5.74 x10E6/uL (ref 4.14–5.80)
RDW: 11.5 % — AB (ref 12.3–15.4)
WBC: 7 10*3/uL (ref 3.4–10.8)

## 2018-10-18 LAB — VITAMIN B12: Vitamin B-12: 349 pg/mL (ref 232–1245)

## 2018-10-18 MED ORDER — METFORMIN HCL 1000 MG PO TABS: 1000.0000 mg | ORAL_TABLET | Freq: Two times a day (BID) | ORAL | 1 refills | Status: DC

## 2018-10-18 MED ORDER — PRAVASTATIN SODIUM 20 MG PO TABS: 20.0000 mg | ORAL_TABLET | Freq: Every day | ORAL | 3 refills | Status: DC

## 2018-10-18 MED ORDER — MICROLET LANCETS MISC: 11 refills | Status: AC

## 2018-10-18 MED ORDER — ENALAPRIL MALEATE 10 MG PO TABS: 10.0000 mg | ORAL_TABLET | Freq: Every day | ORAL | 1 refills | Status: DC

## 2018-10-18 MED ORDER — GLUCOSE BLOOD VI STRP: ORAL_STRIP | 11 refills | Status: DC

## 2018-10-18 MED ORDER — GLIPIZIDE 5 MG PO TABS: 5.0000 mg | ORAL_TABLET | Freq: Every day | ORAL | 3 refills | Status: DC

## 2018-10-22 ENCOUNTER — Other Ambulatory Visit: Payer: Self-pay | Admitting: Medical

## 2019-04-15 ENCOUNTER — Other Ambulatory Visit: Payer: Self-pay | Admitting: Medical

## 2019-04-24 ENCOUNTER — Encounter: Payer: Self-pay | Admitting: Medical

## 2019-04-24 ENCOUNTER — Other Ambulatory Visit: Payer: Self-pay

## 2019-04-24 ENCOUNTER — Ambulatory Visit: Payer: Self-pay | Admitting: Medical

## 2019-05-23 ENCOUNTER — Ambulatory Visit (INDEPENDENT_AMBULATORY_CARE_PROVIDER_SITE_OTHER): Payer: Self-pay | Admitting: Medical

## 2019-05-23 ENCOUNTER — Encounter: Payer: Self-pay | Admitting: Medical

## 2019-05-23 ENCOUNTER — Other Ambulatory Visit: Payer: Self-pay

## 2019-05-23 VITALS — BP 140/90 | HR 80 | Temp 98.0°F | Resp 16 | Ht 76.0 in | Wt 336.6 lb

## 2019-05-23 DIAGNOSIS — E118 Type 2 diabetes mellitus with unspecified complications: Secondary | ICD-10-CM

## 2019-05-23 LAB — POCT GLYCOSYLATED HEMOGLOBIN (HGB A1C): Hemoglobin A1C: 5.4 % (ref 4.0–5.6)

## 2019-05-23 MED ORDER — METFORMIN HCL 500 MG PO TABS: 500.0000 mg | ORAL_TABLET | Freq: Two times a day (BID) | ORAL | 3 refills | Status: DC

## 2019-05-23 MED ORDER — ENALAPRIL MALEATE 20 MG PO TABS: 20.0000 mg | ORAL_TABLET | Freq: Every day | ORAL | 3 refills | Status: DC

## 2019-05-23 MED ORDER — GABAPENTIN 100 MG PO CAPS: 200.0000 mg | ORAL_CAPSULE | Freq: Every day | ORAL | 2 refills | Status: DC

## 2019-05-23 NOTE — Progress Notes (Signed)
Subjective: Chief Complaint  Patient presents with  . med check    fasting med check    Here for med check.  HTN - taking Enalapril 10mg , no complaints.  We started this last visit.   Hyperlipidemia - taking Pravachol 20mg  daily without c/o.   Diabetes - taking Glipizide 5mg  daily, Metformin 1000mg  BID.  Having a lot of loose stool in morning.  Checking sugars some, but usually in the 130-140 fasting range.  Occasional will get 170ish.  Legs still feel numb at times.  Wants to try sometimes to help the sensation.  Otherwise no c/o.  No chest pain, no dyspnea, no edema.   No other aggravating or relieving factors. No other complaint.   Past Medical History:  Diagnosis Date  . Bell's palsy    at 6th or 7th grade on right side   . Depression   . Diabetes mellitus without complication (HCC)    2017  . Elevated LFTs 2017  . Hypertension   . Obesity   . OSA (obstructive sleep apnea)    Current Outpatient Medications on File Prior to Visit  Medication Sig Dispense Refill  . glipiZIDE (GLUCOTROL) 5 MG tablet Take 1 tablet (5 mg total) by mouth daily before breakfast. 90 tablet 3  . glucose blood (BAYER CONTOUR NEXT TEST) test strip Test twice a day as needed. Pt uses contour next ez meter 100 each 11  . MICROLET LANCETS MISC Test daily 100 each 11  . pravastatin (PRAVACHOL) 20 MG tablet Take 1 tablet (20 mg total) by mouth daily. 90 tablet 3   No current facility-administered medications on file prior to visit.     ROS as in subjective   Objective: BP 140/90   Pulse 80   Temp 98 F (36.7 C) (Temporal)   Resp 16   Ht 6\' 4"  (1.93 m)   Wt (!) 336 lb 9.6 oz (152.7 kg)   SpO2 98%   BMI 40.97 kg/m   Wt Readings from Last 3 Encounters:  05/23/19 (!) 336 lb 9.6 oz (152.7 kg)  04/24/19 (!) 323 lb (146.5 kg)  10/17/18 (!) 323 lb 3.2 oz (146.6 kg)   BP Readings from Last 3 Encounters:  05/23/19 140/90  10/17/18 140/90  04/09/18 138/90   Gen: wd, wn, nad Lungs  clear Heart rrr, normal s1, s2, no murmurs Ext: no edema Pulses 2+ upper, 1+ lower  Diabetic Foot Exam - Simple   Simple Foot Form Diabetic Foot exam was performed with the following findings:  Yes 05/23/2019  9:39 AM  Visual Inspection No deformities, no ulcerations, no other skin breakdown bilaterally:  Yes Sensation Testing See comments:  Yes Pulse Check See comments:  Yes Comments 1+ pedal pulses, somewhat decreased sensation right foot with monofilament        Assessment: Encounter Diagnosis  Name Primary?  . Type 2 diabetes with complication (HCC) Yes     Plan: Discussed concerns ,medications, goals  Patient Instructions  Diabetes   Continue Glipizide 5mg  daily  Given the diarrhea, cut back to Metformin 1/2 tablet twice daily or 500mg  twice daily  Your diabetes marker is 5.4% today which is good  Continue to check glucose fasting with goal < 130  Eat a healthy low sugar, low fat diet  Try to lose weight  Hypertension  Lets increase to Enalapril 20mg  daily  Limit salt, drink plenty of water daily   High cholesterol   Continue Pravachol  Varicose veins  Exercise regularly  Elevate  the legs if you have swollen varicose veins  Wear compression socks or diabetic socks   Neuropathy of legs  Begin Gabapentin 200mg  daily at bedtime   Plan to call in 1 month to let me know how the Gabapentin is working and how your blood pressure is doing      Ario was seen today for med check.  Diagnoses and all orders for this visit:  Type 2 diabetes with complication (HCC) -     HgB A1c  Other orders -     enalapril (VASOTEC) 20 MG tablet; Take 1 tablet (20 mg total) by mouth daily. -     metFORMIN (GLUCOPHAGE) 500 MG tablet; Take 1 tablet (500 mg total) by mouth 2 (two) times daily with a meal. -     gabapentin (NEURONTIN) 100 MG capsule; Take 2 capsules (200 mg total) by mouth at bedtime.

## 2019-05-23 NOTE — Patient Instructions (Addendum)
Diabetes   Continue Glipizide 5mg  daily  Given the diarrhea, cut back to Metformin 1/2 tablet twice daily or 500mg  twice daily  Your diabetes marker is 5.4% today which is good  Continue to check glucose fasting with goal < 130  Eat a healthy low sugar, low fat diet  Try to lose weight  Hypertension  Lets increase to Enalapril 20mg  daily  Limit salt, drink plenty of water daily   High cholesterol   Continue Pravachol  Varicose veins  Exercise regularly  Elevate the legs if you have swollen varicose veins  Wear compression socks or diabetic socks   Neuropathy of legs  Begin Gabapentin 200mg  daily at bedtime   Plan to call in 1 month to let me know how the Gabapentin is working and how your blood pressure is doing

## 2019-09-10 ENCOUNTER — Telehealth: Payer: Self-pay | Admitting: Family Medicine

## 2019-09-10 ENCOUNTER — Other Ambulatory Visit: Payer: Self-pay | Admitting: Medical

## 2019-09-10 MED ORDER — METFORMIN HCL 1000 MG PO TABS: 1000.0000 mg | ORAL_TABLET | Freq: Two times a day (BID) | ORAL | 0 refills | Status: DC

## 2019-09-10 NOTE — Telephone Encounter (Signed)
Advised pt of refill.  Set up October Diabetes check.

## 2019-09-10 NOTE — Telephone Encounter (Signed)
Saw Andre [name] at [facility] and he states the Metformin 500 is not working for him.  He needs to go back to Metformin 1000 mg.  Please advise pt.  Please send in rx to [facility]

## 2019-09-10 NOTE — Telephone Encounter (Signed)
He should be coming in at least q25mo for diabetes check.  Due now, please schedule, fasting.  I'll send refill

## 2019-09-21 ENCOUNTER — Other Ambulatory Visit: Payer: Self-pay | Admitting: Medical

## 2019-10-02 ENCOUNTER — Encounter: Payer: Self-pay | Admitting: Medical

## 2019-10-02 ENCOUNTER — Ambulatory Visit (INDEPENDENT_AMBULATORY_CARE_PROVIDER_SITE_OTHER): Payer: Self-pay | Admitting: Medical

## 2019-10-02 ENCOUNTER — Other Ambulatory Visit: Payer: Self-pay

## 2019-10-02 VITALS — BP 138/78 | HR 88 | Temp 97.8°F | Ht 75.0 in | Wt 337.4 lb

## 2019-10-02 DIAGNOSIS — Z7189 Other specified counseling: Secondary | ICD-10-CM

## 2019-10-02 DIAGNOSIS — G4733 Obstructive sleep apnea (adult) (pediatric): Secondary | ICD-10-CM

## 2019-10-02 DIAGNOSIS — E785 Hyperlipidemia, unspecified: Secondary | ICD-10-CM | POA: Insufficient documentation

## 2019-10-02 DIAGNOSIS — Z7185 Encounter for immunization safety counseling: Secondary | ICD-10-CM

## 2019-10-02 DIAGNOSIS — Z2820 Immunization not carried out because of patient decision for unspecified reason: Secondary | ICD-10-CM | POA: Insufficient documentation

## 2019-10-02 DIAGNOSIS — Z9989 Dependence on other enabling machines and devices: Secondary | ICD-10-CM

## 2019-10-02 DIAGNOSIS — I10 Essential (primary) hypertension: Secondary | ICD-10-CM

## 2019-10-02 DIAGNOSIS — E118 Type 2 diabetes mellitus with unspecified complications: Secondary | ICD-10-CM

## 2019-10-02 DIAGNOSIS — F17200 Nicotine dependence, unspecified, uncomplicated: Secondary | ICD-10-CM

## 2019-10-02 DIAGNOSIS — Z2821 Immunization not carried out because of patient refusal: Secondary | ICD-10-CM

## 2019-10-02 DIAGNOSIS — L659 Nonscarring hair loss, unspecified: Secondary | ICD-10-CM | POA: Insufficient documentation

## 2019-10-02 DIAGNOSIS — E1159 Type 2 diabetes mellitus with other circulatory complications: Secondary | ICD-10-CM

## 2019-10-02 LAB — CBC
Hematocrit: 49.9 % (ref 37.5–51.0)
Hemoglobin: 17 g/dL (ref 13.0–17.7)
MCH: 31.5 pg (ref 26.6–33.0)
MCHC: 34.1 g/dL (ref 31.5–35.7)
MCV: 93 fL (ref 79–97)
Platelets: 232 10*3/uL (ref 150–450)
RBC: 5.39 x10E6/uL (ref 4.14–5.80)
RDW: 11.6 % (ref 11.6–15.4)
WBC: 6.3 10*3/uL (ref 3.4–10.8)

## 2019-10-02 LAB — POCT UA - MICROALBUMIN
Albumin/Creatinine Ratio, Urine, POC: 53.7
Creatinine, POC: 211.1 mg/dL
Microalbumin Ur, POC: 113.3 mg/L

## 2019-10-02 NOTE — Progress Notes (Signed)
Subjective: Chief Complaint  Patient presents with  . Diabetes   Here for chronic disease follow up.     Diabetes - He notes checking some home sugars.  Was only taken 1/2 tablet of metformin twice daily, was in the 200s, but after he went to the whole dose, Metformin 1000mg  BID, sugars came way down in the 100s.   No foot concerns.  No polyuria, no polydipsia.  He is fasting today.  He quit taking Gabapentin, felt loopy, forgetful.   After a week off this medication felt improved.  Ended up taking Gabapentin for a few months.    He has been experiencing hair loss generalized of scalp.   No other hair loss but has always been on the thin side of hair in pubic and axillary regions.   Hair loss started before gabapentin.   He wonders about metformin causing this.  Father has male pattern baldness.    Taking enalapril 20 mg daily without complaint.  No chest pain, no difficulty breathing, no edema  Hyperlipidemia-compliant with pravastatin 20 mg daily without complaint.   Past Medical History:  Diagnosis Date  . Bell's palsy    at 6th or 7th grade on right side   . Depression   . Diabetes mellitus without complication ([place])    5409  . Elevated LFTs 2017  . Hypertension   . Obesity   . OSA (obstructive sleep apnea)    Current Outpatient Medications on File Prior to Visit  Medication Sig Dispense Refill  . enalapril (VASOTEC) 20 MG tablet Take 1 tablet (20 mg total) by mouth daily. 90 tablet 3  . glipiZIDE (GLUCOTROL) 5 MG tablet TAKE ONE TABLET BY MOUTH DAILY BEFORE BREAKFAST 30 tablet 0  . glucose blood (BAYER CONTOUR NEXT TEST) test strip Test twice a day as needed. Pt uses contour next ez meter 100 each 11  . metFORMIN (GLUCOPHAGE) 1000 MG tablet Take 1 tablet (1,000 mg total) by mouth 2 (two) times daily with a meal. 60 tablet 0  . MICROLET LANCETS MISC Test daily 100 each 11  . pravastatin (PRAVACHOL) 20 MG tablet Take 1 tablet (20 mg total) by mouth daily. 90 tablet 3  .  gabapentin (NEURONTIN) 100 MG capsule Take 2 capsules (200 mg total) by mouth at bedtime. (Patient not taking: Reported on 10/02/2019) 60 capsule 2   No current facility-administered medications on file prior to visit.    ROS as in subjective   Objective: BP 138/78   Pulse 88   Temp 97.8 F (36.6 C)   Ht 6\' 3"  (1.905 m)   Wt (!) 337 lb 6.4 oz (153 kg)   SpO2 97%   BMI 42.17 kg/m    General appearance: alert, no distress, WD/WN,  Neck: Anterior surgical scar from prior neck surgery, supple, no lymphadenopathy, no thyromegaly, no masses, no bruits Heart: RRR, normal S1, S2, no murmurs Lungs: CTA bilaterally, no wheezes, rhonchi, or rales Pulses: 2+ symmetric, upper and lower extremities, normal cap refill Foot exam: He had full lace up boots today, declined exam There is no obvious hair loss of scalp although he says it is generally thinned out    Assessment: Encounter Diagnoses  Name Primary?  . Type 2 diabetes with complication (HCC) Yes  . Influenza vaccination declined   . Hypertension associated with diabetes ([employer])   . Severe obesity (BMI >= 40) (HCC)   . Smoker   . Vaccine counseling   . Hyperlipidemia, unspecified hyperlipidemia type   .  Hair loss   . Vaccine refused by patient   . OSA on CPAP       Plan: Diabetes-advised routine glucometer testing, compliance with medication, labs today, advised daily foot checks, yearly eye doctor visit.   Routine labs today  Hypertension-compliant with medication continue current medication  Hyperlipidemia-compliant-routine labs today  Obesity-counseled on need to work on healthy diet and exercise and weight loss change  Continue with CPAP therapy  Recommendations  Diabetes  See your eye doctor yearly for routine diabetic screening and make sure they send [place] a copy of the record  See your dentist for routine dental hygiene care  Check your feet daily for foot lesions or sores are not healing as diabetes can have  complications of foot neuropathy and nonhealing sores that can lead to amputations  Check your sugars regularly, goal is less than 130 glucose in the morning fasting  Hypertension   goal blood pressure is 130/80 or less  Continue current blood pressure medication  High cholesterol  Continue cholesterol medication, statin for heart disease prevention   Hair loss  Once I review your labs we can consider possible changes to your medication since you feel this is a medication issue   Vaccines As a diabetic it is recommended you have several vaccines to prevent illness since you are high risk  I recommend you get these vaccines through your local pharmacy as this will be cheaper for you without insurance   I recommend a pneumococcal 23 vaccine to prevent certain strains of pneumonia  I recommend hepatitis B series vaccine  I recommend a yearly flu shot however you have had prior adverse reactions so we would avoid this  You are up-to-date on tetanus vaccine  Andre [name] was seen today for diabetes.  Diagnoses and all orders for this visit:  Type 2 diabetes with complication (HCC) -     Comprehensive metabolic panel -     Cancel: CBC with Differential/Platelet -     Hemoglobin A1c -     POCT UA - Microalbumin -     CBC  Influenza vaccination declined  Hypertension associated with diabetes (HCC) -     Comprehensive metabolic panel -     CBC  Severe obesity (BMI >= 40) (HCC)  Smoker  Vaccine counseling  Hyperlipidemia, unspecified hyperlipidemia type -     Lipid panel  Hair loss  Vaccine refused by patient  OSA on CPAP

## 2019-10-02 NOTE — Patient Instructions (Addendum)
Recommendations  Diabetes  See your eye doctor yearly for routine diabetic screening and make sure they send [place] a copy of the record  See your dentist for routine dental hygiene care  Check your feet daily for foot lesions or sores are not healing as diabetes can have complications of foot neuropathy and nonhealing sores that can lead to amputations  Check your sugars regularly, goal is less than 130 glucose in the morning fasting  Hypertension   goal blood pressure is 130/80 or less  Continue current blood pressure medication  High cholesterol  Continue cholesterol medication, statin for heart disease prevention   Hair loss  Once I review your labs we can consider possible changes to your medication since you feel this is a medication issue   Vaccines As a diabetic it is recommended you have several vaccines to prevent illness since you are high risk  I recommend you get these vaccines through your local pharmacy as this will be cheaper for you without insurance   I recommend a pneumococcal 23 vaccine to prevent certain strains of pneumonia  I recommend hepatitis B series vaccine  I recommend a yearly flu shot however you have had prior adverse reactions so we would avoid this  You are up-to-date on tetanus vaccine  Exercise: I recommend exercising most days of the week using a type of exercise that you would enjoy and stick to such as walking, running, swimming, hiking, biking, aerobics, etc. This needs to be at least 30-40 minutes at a time, at least 5 days/week with moderate intensity.  This would be 60-70% of your maximum heart rate.  For example, your maximal heart rate would be 200- your age, multiplied x 0.6 to equal 60% of your maximal heart rate.    Thus, a person age 27 would have a maximum heart rate of 160.  60% of this would be 96 beats per minute.  Thus for fat burning exercise, a 39 year old would want to exercise has sustained heart rate of about 96 bpm  for fat burning.  However for heart health, they would want to exercise at a higher heart rate of about 75% of maximum heart rate which would be a heart rate of about 120 beats per minute.  So I recommend a combination of doing some exercise at moderate intensity, and some exercise at vigorous intensity for heart health.   Low Carb Diet recommendations:  I recommend you drink water throughout the day.   70 ounces or 2 liters would be a good amount.  If you have been accustomed to drinking juice or soda, try water with lemon or water with lime, or try using no calorie flavor dropper.  I recommend the following as an example meal plan that includes 3 meals per day.   You can skip some meals periodically for intermittent fasting.  Breakfast (choose one): Marland Kitchen Omelette with egg, can include a little bit of cheese, your choice of mushrooms, peppers, onions, salsa . Smoothie with handful of spinach or kale, 1 cup of milk or water, 1 cup of berries, 1 packet of artificial sweetener such as stevia . Yogurt with fruit   Mid morning snack: . 1 fruit serving and 1 protein such as 8 almonds or 8 nuts, or vegetable such as carrots and humus or other similar vegetable   Lunch: . Salad with 3-4 ounces of lean grilled or baked meat such as fish, chicken or [place]   Mid afternoon snack: . 1 fruit serving and  1 protein such as 8 almonds or 8 nuts, or vegetable such as carrots and humus or other similar vegetable   Dinner: . Large serving of vegetables and 3-4 ounces of lean grilled or baked meat such as fish, chicken or [place] . Or vegetarian dish without meat    Avoid  . Chips, cookies, cake, donuts, soda, sweet tea, juices, candy, fast food . For now , avoid, or significantly limit grains

## 2019-10-03 ENCOUNTER — Encounter: Payer: Self-pay | Admitting: Medical

## 2019-10-03 LAB — COMPREHENSIVE METABOLIC PANEL
ALT: 53 IU/L — ABNORMAL HIGH (ref 0–44)
AST: 32 IU/L (ref 0–40)
Albumin/Globulin Ratio: 1.9 (ref 1.2–2.2)
Albumin: 4.3 g/dL (ref 4.0–5.0)
Alkaline Phosphatase: 86 IU/L (ref 39–117)
BUN/Creatinine Ratio: 10 (ref 9–20)
BUN: 11 mg/dL (ref 6–20)
Bilirubin Total: 0.5 mg/dL (ref 0.0–1.2)
CO2: 25 mmol/L (ref 20–29)
Calcium: 9.2 mg/dL (ref 8.7–10.2)
Chloride: 100 mmol/L (ref 96–106)
Creatinine, Ser: 1.05 mg/dL (ref 0.76–1.27)
GFR calc Af Amer: 104 mL/min/{1.73_m2} (ref >59)
GFR calc non Af Amer: 90 mL/min/{1.73_m2} (ref >59)
Globulin, Total: 2.3 g/dL (ref 1.5–4.5)
Glucose: 179 mg/dL — ABNORMAL HIGH (ref 65–99)
Potassium: 4.3 mmol/L (ref 3.5–5.2)
Sodium: 135 mmol/L (ref 134–144)
Total Protein: 6.6 g/dL (ref 6.0–8.5)

## 2019-10-03 LAB — HEMOGLOBIN A1C
Est. average glucose Bld gHb Est-mCnc: 206 mg/dL
Hgb A1c MFr Bld: 8.8 % — ABNORMAL HIGH (ref 4.8–5.6)

## 2019-10-03 LAB — LIPID PANEL
Chol/HDL Ratio: 4.5 ratio (ref 0.0–5.0)
Cholesterol, Total: 141 mg/dL (ref 100–199)
HDL: 31 mg/dL — ABNORMAL LOW (ref >39)
LDL Chol Calc (NIH): 87 mg/dL (ref 0–99)
Triglycerides: 125 mg/dL (ref 0–149)
VLDL Cholesterol Cal: 23 mg/dL (ref 5–40)

## 2019-10-21 ENCOUNTER — Telehealth: Payer: Self-pay | Admitting: Internal Medicine

## 2019-10-21 ENCOUNTER — Other Ambulatory Visit: Payer: Self-pay | Admitting: Medical

## 2019-10-21 MED ORDER — ROSUVASTATIN CALCIUM 20 MG PO TABS: 20.0000 mg | ORAL_TABLET | Freq: Every day | ORAL | 3 refills | Status: DC

## 2019-10-21 MED ORDER — METFORMIN HCL 1000 MG PO TABS: 1000.0000 mg | ORAL_TABLET | Freq: Two times a day (BID) | ORAL | 3 refills | Status: DC

## 2019-10-21 MED ORDER — FARXIGA 5 MG PO TABS: 5.0000 mg | ORAL_TABLET | Freq: Every day | ORAL | 0 refills | Status: DC

## 2019-10-21 NOTE — Telephone Encounter (Signed)
Pt was notified of results

## 2019-10-21 NOTE — Telephone Encounter (Signed)
Pt called and states that he is ok adding another DM med with continuation of metformin. He does not want an injectable med. Please send in new med and Metformin. Pt started back on metformin 1,000mg  about a month ago

## 2019-10-21 NOTE — Telephone Encounter (Signed)
Okay I reviewed his message  Continue metformin Begin Farxiga 5 mg 1 tablet daily, increase water intake while on this medication.  For example, I would like him to drink at least 64 oz water daily I changed the Pravachol to Crestor as it will work better Continue enalapril  If any of the medicines are too expensive at the pharmacy let me know right away  F/u 38mo

## 2019-10-22 ENCOUNTER — Other Ambulatory Visit: Payer: Self-pay | Admitting: Medical

## 2019-10-23 ENCOUNTER — Telehealth: Payer: Self-pay | Admitting: Medical

## 2019-10-23 NOTE — Telephone Encounter (Signed)
Pt called that he has no insurance and Wilder Glade is $500 at pharmacy.  Samples given, ok per Audelia Acton & printed out Pt Assistance Application and called pt and informed

## 2019-10-29 NOTE — Telephone Encounter (Signed)
Pt Assistance application completed and faxed  

## 2019-10-31 ENCOUNTER — Other Ambulatory Visit: Payer: Self-pay | Admitting: Medical

## 2019-11-04 ENCOUNTER — Other Ambulatory Visit: Payer: Self-pay | Admitting: Medical

## 2019-11-05 NOTE — Telephone Encounter (Signed)
Pt not on this med.

## 2019-11-11 ENCOUNTER — Other Ambulatory Visit: Payer: Self-pay | Admitting: Medical

## 2020-02-11 ENCOUNTER — Telehealth: Payer: Self-pay | Admitting: Family Medicine

## 2020-02-11 NOTE — Telephone Encounter (Signed)
Called [employer] t/w Sheryle Hail.  His application was approved 10/30/2019- 10/28/2020 Pt has rcd last shipment 01/20/2020 and next is 04/11/2020 of Farxiga 5 mg qd.

## 2020-08-02 ENCOUNTER — Other Ambulatory Visit: Payer: Self-pay | Admitting: Medical

## 2020-09-02 ENCOUNTER — Other Ambulatory Visit: Payer: Self-pay

## 2020-09-02 ENCOUNTER — Ambulatory Visit (INDEPENDENT_AMBULATORY_CARE_PROVIDER_SITE_OTHER): Payer: Self-pay | Admitting: Medical

## 2020-09-02 ENCOUNTER — Encounter: Payer: Self-pay | Admitting: Medical

## 2020-09-02 VITALS — BP 144/88 | HR 64 | Temp 98.5°F | Wt 318.4 lb

## 2020-09-02 DIAGNOSIS — Z6839 Body mass index (BMI) 39.0-39.9, adult: Secondary | ICD-10-CM

## 2020-09-02 DIAGNOSIS — F17200 Nicotine dependence, unspecified, uncomplicated: Secondary | ICD-10-CM

## 2020-09-02 DIAGNOSIS — Z7189 Other specified counseling: Secondary | ICD-10-CM

## 2020-09-02 DIAGNOSIS — Z9989 Dependence on other enabling machines and devices: Secondary | ICD-10-CM

## 2020-09-02 DIAGNOSIS — Z7185 Encounter for immunization safety counseling: Secondary | ICD-10-CM

## 2020-09-02 DIAGNOSIS — R7989 Other specified abnormal findings of blood chemistry: Secondary | ICD-10-CM

## 2020-09-02 DIAGNOSIS — G4733 Obstructive sleep apnea (adult) (pediatric): Secondary | ICD-10-CM

## 2020-09-02 DIAGNOSIS — I10 Essential (primary) hypertension: Secondary | ICD-10-CM

## 2020-09-02 DIAGNOSIS — E118 Type 2 diabetes mellitus with unspecified complications: Secondary | ICD-10-CM

## 2020-09-02 DIAGNOSIS — E785 Hyperlipidemia, unspecified: Secondary | ICD-10-CM

## 2020-09-02 DIAGNOSIS — E1159 Type 2 diabetes mellitus with other circulatory complications: Secondary | ICD-10-CM

## 2020-09-02 NOTE — Patient Instructions (Addendum)
Sleep study vendors [facility] [phone]  [employer] [phone]  [employer] [phone]  [facility] [phone]   CPAP supplies vendors [facility] [phone]  [facility] [phone]   [facility] [phone]  [employer] [phone]

## 2020-09-02 NOTE — Progress Notes (Signed)
Subjective:  Andre [name] is a 39 y.o. male who presents for Chief Complaint  Patient presents with  . Diabetes    diabetes check BP been up      Here for chronic disease management, med check.  Diabetes type 2-compliant with Farxiga 5 mg daily, Metformin 1000 mg twice daily.   Checks glucose sometimes.  Lowest reading 130, but has seen some numbers in the 200s.     Hypertension-compliant with enalapril 20 mg daily.   Been seeing some elevated readings.   Hyperlipidemia-compliant with Crestor 20 mg daily  OSA- compliant with CPAP.  Last sleep study 10 years.   Tobacco use - still smoking.  Elevated LFTs- no prior Hep B or C exposure , no prior IV drug use.  Drinks on average 1 beer a few time per week.  Obesity- has lost some weight since last visit.   No other aggravating or relieving factors.    No other c/o.  The following portions of the patient's history were reviewed and updated as appropriate: allergies, current medications, past family history, past medical history, past social history, past surgical history and problem list.  ROS Otherwise as in subjective above    Objective: BP (!) 144/88   Pulse 64   Temp 98.5 F (36.9 C)   Wt (!) 318 lb 6.4 oz (144.4 kg)   BMI 39.80 kg/m    BP Readings from Last 3 Encounters:  09/02/20 (!) 144/88  10/02/19 138/78  05/23/19 140/90   Wt Readings from Last 3 Encounters:  09/02/20 (!) 318 lb 6.4 oz (144.4 kg)  10/02/19 (!) 337 lb 6.4 oz (153 kg)  05/23/19 (!) 336 lb 9.6 oz (152.7 kg)    General appearance: alert, no distress, well developed, well nourished Neck: supple, no lymphadenopathy, no thyromegaly, no masses, no bruits Heart: RRR, normal S1, S2, no murmurs Lungs: CTA bilaterally, no wheezes, rhonchi, or rales Abdomen: +bs, soft, non tender, non distended, no masses, no hepatomegaly, no splenomegaly Pulses: 2+ radial pulses, 2+ pedal pulses, normal cap refill Ext: no  edema   Assessment: Encounter Diagnoses  Name Primary?  . Type 2 diabetes with complication (HCC) Yes  . OSA on CPAP   . Hypertension associated with diabetes (HCC)   . Smoker   . Elevated LFTs   . Vaccine counseling   . Hyperlipidemia, unspecified hyperlipidemia type   . BMI 39.0-39.9,adult      Plan: Diabetes type 2-labs today, continue current medication, will likely need to modify regimen as his sugars do not sound to be at goal.  Continue daily foot checks, advise more consistent glucose monitoring.  Obesity-congratulated him on some weight loss.  Keep up efforts to lose weight through healthy diet and exercise  Hypertension-not at goal, pending labs we will make some modifications.  Counseled on lifestyle changes that are needed  Smoker-he has no desire to quit although counseled on need to quit smoking  Elevated LFTs-additional evaluation today.  Consider ultrasound.  Likely fatty liver disease.  Hyperlipidemia-continue statin, labs today  Sleep apnea-we will work to find a sleep study company for him.  He is cash pay.  He will help call to find a vendor he wants to use   Vaccines:  As a diabetic there are several vaccines that are recommended keep you healthy and to prevent disease  I recommend yearly flu shot  I recommend pneumococcal 23 pneumonia vaccine to help reduce risk of several strains of pneumonia  I recommend the Covid  vaccine as you are high risk for complications  You are up-to-date on tetanus but you will be due in 2022   Andre [name] was seen today for diabetes.  Diagnoses and all orders for this visit:  Type 2 diabetes with complication (HCC) -     Hemoglobin A1c -     Comprehensive metabolic panel -     Microalbumin/Creatinine Ratio, Urine -     Lipid panel  OSA on CPAP  Hypertension associated with diabetes (HCC) -     Comprehensive metabolic panel  Smoker  Elevated LFTs -     Hepatitis C antibody -     Hepatitis B surface  antigen  Vaccine counseling  Hyperlipidemia, unspecified hyperlipidemia type -     Lipid panel  BMI 39.0-39.9,adult    Follow up: pending labs

## 2020-09-03 ENCOUNTER — Other Ambulatory Visit: Payer: Self-pay | Admitting: Medical

## 2020-09-03 LAB — COMPREHENSIVE METABOLIC PANEL
ALT: 42 IU/L (ref 0–44)
AST: 23 IU/L (ref 0–40)
Albumin/Globulin Ratio: 2 (ref 1.2–2.2)
Albumin: 4.5 g/dL (ref 4.0–5.0)
Alkaline Phosphatase: 86 IU/L (ref 48–121)
BUN/Creatinine Ratio: 12 (ref 9–20)
BUN: 13 mg/dL (ref 6–20)
Bilirubin Total: 0.6 mg/dL (ref 0.0–1.2)
CO2: 25 mmol/L (ref 20–29)
Calcium: 9.5 mg/dL (ref 8.7–10.2)
Chloride: 99 mmol/L (ref 96–106)
Creatinine, Ser: 1.09 mg/dL (ref 0.76–1.27)
GFR calc Af Amer: 98 mL/min/{1.73_m2} (ref >59)
GFR calc non Af Amer: 85 mL/min/{1.73_m2} (ref >59)
Globulin, Total: 2.2 g/dL (ref 1.5–4.5)
Glucose: 146 mg/dL — ABNORMAL HIGH (ref 65–99)
Potassium: 4.7 mmol/L (ref 3.5–5.2)
Sodium: 137 mmol/L (ref 134–144)
Total Protein: 6.7 g/dL (ref 6.0–8.5)

## 2020-09-03 LAB — MICROALBUMIN / CREATININE URINE RATIO
Creatinine, Urine: 49.9 mg/dL
Microalb/Creat Ratio: 9 mg/g creat (ref 0–29)
Microalbumin, Urine: 4.7 ug/mL

## 2020-09-03 LAB — HEMOGLOBIN A1C
Est. average glucose Bld gHb Est-mCnc: 203 mg/dL
Hgb A1c MFr Bld: 8.7 % — ABNORMAL HIGH (ref 4.8–5.6)

## 2020-09-03 LAB — LIPID PANEL
Chol/HDL Ratio: 3.4 ratio (ref 0.0–5.0)
Cholesterol, Total: 113 mg/dL (ref 100–199)
HDL: 33 mg/dL — ABNORMAL LOW (ref >39)
LDL Chol Calc (NIH): 58 mg/dL (ref 0–99)
Triglycerides: 122 mg/dL (ref 0–149)
VLDL Cholesterol Cal: 22 mg/dL (ref 5–40)

## 2020-09-03 LAB — HEPATITIS C ANTIBODY: Hep C Virus Ab: 0.1 s/co ratio (ref 0.0–0.9)

## 2020-09-03 LAB — HEPATITIS B SURFACE ANTIGEN: Hepatitis B Surface Ag: NEGATIVE

## 2020-09-03 MED ORDER — ROSUVASTATIN CALCIUM 20 MG PO TABS: 20.0000 mg | ORAL_TABLET | Freq: Every day | ORAL | 3 refills | Status: DC

## 2020-09-03 MED ORDER — METFORMIN HCL 1000 MG PO TABS: 1000.0000 mg | ORAL_TABLET | Freq: Two times a day (BID) | ORAL | 3 refills | Status: DC

## 2020-09-03 MED ORDER — METOPROLOL SUCCINATE ER 25 MG PO TB24: 25.0000 mg | ORAL_TABLET | Freq: Every day | ORAL | 2 refills | Status: DC

## 2020-09-03 MED ORDER — ENALAPRIL MALEATE 20 MG PO TABS: 20.0000 mg | ORAL_TABLET | Freq: Every day | ORAL | 3 refills | Status: DC

## 2020-09-03 MED ORDER — DAPAGLIFLOZIN PROPANEDIOL 10 MG PO TABS: 10.0000 mg | ORAL_TABLET | Freq: Every day | ORAL | 3 refills | Status: DC

## 2020-09-07 ENCOUNTER — Other Ambulatory Visit: Payer: Self-pay

## 2020-09-07 NOTE — Telephone Encounter (Signed)
Patient would like farxiga to be sent to mail order. Patient wants to do sleep study and hold off on the imaging.

## 2020-09-08 ENCOUNTER — Telehealth: Payer: Self-pay | Admitting: Medical

## 2020-09-08 ENCOUNTER — Other Ambulatory Visit: Payer: Self-pay | Admitting: Medical

## 2020-09-08 ENCOUNTER — Other Ambulatory Visit: Payer: Self-pay

## 2020-09-08 DIAGNOSIS — Z9989 Dependence on other enabling machines and devices: Secondary | ICD-10-CM

## 2020-09-08 NOTE — Telephone Encounter (Signed)
Please giver hard script to Vernona Rieger so she can send farxiga.

## 2020-09-08 NOTE — Telephone Encounter (Signed)
Mail order to where?  Refer for sleep study, [place] to see if they can get a better cash price

## 2020-09-08 NOTE — Telephone Encounter (Signed)
Called PT Assistance Farxiga t# [phone] and they need faxed written Rx to do dosage change, this was faxed to t# [phone].  Pt due to reapply after 09/28/20

## 2020-09-08 NOTE — Telephone Encounter (Signed)
Script ready.

## 2020-09-08 NOTE — Telephone Encounter (Signed)
Sleep study has already been done.

## 2020-09-08 NOTE — Telephone Encounter (Signed)
Pt Assistance handled for dosage change, sending back to you for sleep study

## 2020-09-10 NOTE — Telephone Encounter (Signed)
Called pt Assistance and they received the fax and medication was ordered and will be shipped in 7-10 days

## 2020-09-11 NOTE — Telephone Encounter (Signed)
done

## 2020-09-22 ENCOUNTER — Telehealth: Payer: Self-pay | Admitting: Medical

## 2020-09-22 NOTE — Telephone Encounter (Signed)
Pt called to find out about his sleep study said he hasn't heard anything yet, can you please find out and call pt back

## 2020-09-24 NOTE — Telephone Encounter (Signed)
Called sleep center and they are working on getting him scheduled. They have noted patient will be self pay. They will contact patient about scheduling an appointment.

## 2020-10-20 NOTE — Telephone Encounter (Signed)
Will refer to snap

## 2020-10-20 NOTE — Telephone Encounter (Signed)
I called pt about his  PT Assistance & he states he still hasn't hear anything about his Sleep Study appointment.  Can you please check on this

## 2020-12-01 ENCOUNTER — Other Ambulatory Visit: Payer: Self-pay

## 2020-12-01 ENCOUNTER — Telehealth: Payer: Self-pay

## 2020-12-01 ENCOUNTER — Telehealth: Payer: Self-pay | Admitting: Medical

## 2020-12-01 MED ORDER — METOPROLOL SUCCINATE ER 25 MG PO TB24: 25.0000 mg | ORAL_TABLET | Freq: Every day | ORAL | 2 refills | Status: DC

## 2020-12-01 NOTE — Telephone Encounter (Signed)
Medication has been sent.  

## 2020-12-01 NOTE — Telephone Encounter (Signed)
Pt called & states he only has 2 Farxiga pills left, he gets Pt Assistance.  I called to check and they first said was shipped on 11/11/20 then called back and was error that pt needed to renew his application and could do over the phone.  Pt did application over the phone and should ship within 3-5 business days.  Will give 1 box samples to hold until it is received.

## 2020-12-01 NOTE — Telephone Encounter (Signed)
Pt called and is requesting a refill on his toprol xl please send to the [facility] - [place], [place] - [address]

## 2021-03-03 NOTE — Telephone Encounter (Signed)
Pt called and needs refill Farxiga.  Samples given to hold pt until received.  Called [place] T# [phone] & med has already shipped estimated delivery 03/09/21.  Will need new refill in August.

## 2021-03-04 ENCOUNTER — Other Ambulatory Visit: Payer: Self-pay | Admitting: Medical

## 2021-03-08 ENCOUNTER — Ambulatory Visit (INDEPENDENT_AMBULATORY_CARE_PROVIDER_SITE_OTHER): Payer: Self-pay | Admitting: Medical

## 2021-03-08 ENCOUNTER — Encounter: Payer: Self-pay | Admitting: Medical

## 2021-03-08 ENCOUNTER — Other Ambulatory Visit: Payer: Self-pay

## 2021-03-08 VITALS — BP 138/90 | HR 81 | Ht 75.5 in | Wt 317.4 lb

## 2021-03-08 DIAGNOSIS — M2141 Flat foot [pes planus] (acquired), right foot: Secondary | ICD-10-CM

## 2021-03-08 DIAGNOSIS — E118 Type 2 diabetes mellitus with unspecified complications: Secondary | ICD-10-CM

## 2021-03-08 DIAGNOSIS — I152 Hypertension secondary to endocrine disorders: Secondary | ICD-10-CM

## 2021-03-08 DIAGNOSIS — E1159 Type 2 diabetes mellitus with other circulatory complications: Secondary | ICD-10-CM

## 2021-03-08 DIAGNOSIS — M2142 Flat foot [pes planus] (acquired), left foot: Secondary | ICD-10-CM

## 2021-03-08 DIAGNOSIS — M2140 Flat foot [pes planus] (acquired), unspecified foot: Secondary | ICD-10-CM | POA: Insufficient documentation

## 2021-03-08 DIAGNOSIS — E785 Hyperlipidemia, unspecified: Secondary | ICD-10-CM

## 2021-03-08 DIAGNOSIS — L84 Corns and callosities: Secondary | ICD-10-CM | POA: Insufficient documentation

## 2021-03-08 MED ORDER — METOPROLOL SUCCINATE ER 50 MG PO TB24: 50.0000 mg | ORAL_TABLET | Freq: Every day | ORAL | 1 refills | Status: DC

## 2021-03-08 NOTE — Progress Notes (Signed)
Subjective:  Andre [name] is a 40 y.o. male who presents for Chief Complaint  Patient presents with  . Diabetes     Here for chronic disease management, med check.  Diabetes type 2-compliant with Farxiga 10 mg daily (samples), Metformin 1000 mg twice daily.   Checks glucose sometimes.    Hypertension-compliant with enalapril 20 mg daily, Metoprolol 25mg  XL daily.  Not checking BPs.  Denies chest pain, edema, palpitations.  Hyperlipidemia-compliant with Crestor 20 mg daily without complaint  He reports that he is supposed to have insurance again in about a month.  He is still self-pay  No other aggravating or relieving factors.    No other c/o.  Past Medical History:  Diagnosis Date  . Bell's palsy    at 6th or 7th grade on right side   . Depression   . Diabetes mellitus without complication (HCC)    2017  . Elevated LFTs 2017  . Hypertension   . Obesity   . OSA (obstructive sleep apnea)     The following portions of the patient's history were reviewed and updated as appropriate: allergies, current medications, past family history, past medical history, past social history, past surgical history and problem list.  ROS Otherwise as in subjective above    Objective: BP 138/90   Pulse 81   Ht 6' 3.5" (1.918 m)   Wt (!) 317 lb 6.4 oz (144 kg)   SpO2 95%   BMI 39.15 kg/m    BP Readings from Last 3 Encounters:  03/08/21 138/90  09/02/20 (!) 144/88  10/02/19 138/78   Wt Readings from Last 3 Encounters:  03/08/21 (!) 317 lb 6.4 oz (144 kg)  09/02/20 (!) 318 lb 6.4 oz (144.4 kg)  10/02/19 (!) 337 lb 6.4 oz (153 kg)    General appearance: alert, no distress, well developed, well nourished Neck: supple, no lymphadenopathy, no thyromegaly, no masses, no bruits Heart: RRR, normal S1, S2, no murmurs Lungs: CTA bilaterally, no wheezes, rhonchi, or rales pulses: 2+ radial pulses, 2+ pedal pulses, normal cap refill Ext: no edema   Diabetic Foot Exam -  Simple   Simple Foot Form Diabetic Foot exam was performed with the following findings: Yes 03/08/2021  5:07 PM  Visual Inspection See comments: Yes Sensation Testing Intact to touch and monofilament testing bilaterally: Yes Pulse Check Posterior Tibialis and Dorsalis pulse intact bilaterally: Yes Comments Relatively small callus of distal fifth metatarsal left foot, no other foot lesions, flat feet bilaterally      Assessment: Encounter Diagnoses  Name Primary?  . Type 2 diabetes with complication (HCC) Yes  . Hyperlipidemia, unspecified hyperlipidemia type   . Hypertension associated with diabetes (HCC)   . Foot callus   . Pes planus of both feet      Plan: Diabetes type 2-hemoglobin A1c same as last time over 8% not at goal.   I gave him Rybelsus samples to begin 3 mg daily.  Discussed risk and benefits of medication.  For now continue Metformin and Farxiga as well.   When his insurance kicks in in 1 month I advised him to call back so we can discuss coverage for the Rybelsus and increasing to 7 mg.  He does not seem to have any improvements on the Farxiga at the 10 mg dose.  We also discussed the freestyle libre.  He is not checking his sugars very often.  Counseled on diet, exercise.  Continue daily foot checks, advise more consistent glucose monitoring.  Hypertension-not  at goal.  Continue enalapril, increase Toprol to 50 mg daily  Hyperlipidemia-continue statin, labs today  Callus of foot-continue pedicures, continue daily foot checks.    Pes planus-advised shoe insert for arch support  Andre [name] was seen today for diabetes.  Diagnoses and all orders for this visit:  Type 2 diabetes with complication (HCC) -     HgB A1c  Hyperlipidemia, unspecified hyperlipidemia type  Hypertension associated with diabetes (HCC)  Foot callus  Pes planus of both feet  Other orders -     metoprolol succinate (TOPROL XL) 50 MG 24 hr tablet; Take 1 tablet (50 mg total) by mouth  daily. Take with or immediately following a meal.    Follow up:42mo

## 2021-03-08 NOTE — Patient Instructions (Signed)
Recommendation  I am increasing Toprol xl to 50mg  daily .  You just refilled the Toprol 25mg .  Take 2 tablets of the 25mg  daily until you run out then switch to Toprol 50mg  XL daily  Begin samples of Rybelsus 3mg  tablets daily  Rybleus can cause some nausea.    Continue all other medications for now  Call back in a month when your insurance kicks in so we can explore Rybelsus prescription at 7mg  and explore coverage for Free Style Libre glucose monitor

## 2021-03-11 LAB — POCT GLYCOSYLATED HEMOGLOBIN (HGB A1C): Hemoglobin A1C: 8.2 % — AB (ref 4.0–5.6)

## 2021-03-22 ENCOUNTER — Ambulatory Visit: Payer: Self-pay | Admitting: Medical

## 2021-06-23 ENCOUNTER — Telehealth: Payer: Self-pay

## 2021-06-23 NOTE — Telephone Encounter (Signed)
Recv'd fax Pt Assistance Farxiga shipped to patient

## 2021-08-14 ENCOUNTER — Telehealth: Payer: Self-pay

## 2021-08-14 NOTE — Telephone Encounter (Signed)
Recv'd fax PT ASSISTANCE FARXIGA being shipped

## 2021-08-20 ENCOUNTER — Encounter: Payer: Self-pay | Admitting: Medical

## 2021-08-29 ENCOUNTER — Other Ambulatory Visit: Payer: Self-pay | Admitting: Medical

## 2021-09-03 ENCOUNTER — Encounter: Payer: Self-pay | Admitting: Medical

## 2021-09-15 ENCOUNTER — Ambulatory Visit: Payer: 59 | Admitting: Medical

## 2021-09-15 ENCOUNTER — Other Ambulatory Visit: Payer: Self-pay

## 2021-09-15 ENCOUNTER — Encounter: Payer: Self-pay | Admitting: Medical

## 2021-09-15 VITALS — BP 124/80 | HR 76 | Wt 308.2 lb

## 2021-09-15 DIAGNOSIS — E118 Type 2 diabetes mellitus with unspecified complications: Secondary | ICD-10-CM | POA: Diagnosis not present

## 2021-09-15 DIAGNOSIS — I152 Hypertension secondary to endocrine disorders: Secondary | ICD-10-CM

## 2021-09-15 DIAGNOSIS — Z2820 Immunization not carried out because of patient decision for unspecified reason: Secondary | ICD-10-CM

## 2021-09-15 DIAGNOSIS — Z7185 Encounter for immunization safety counseling: Secondary | ICD-10-CM | POA: Diagnosis not present

## 2021-09-15 DIAGNOSIS — R519 Headache, unspecified: Secondary | ICD-10-CM | POA: Diagnosis not present

## 2021-09-15 DIAGNOSIS — G4733 Obstructive sleep apnea (adult) (pediatric): Secondary | ICD-10-CM

## 2021-09-15 DIAGNOSIS — Z9989 Dependence on other enabling machines and devices: Secondary | ICD-10-CM

## 2021-09-15 DIAGNOSIS — E785 Hyperlipidemia, unspecified: Secondary | ICD-10-CM

## 2021-09-15 DIAGNOSIS — Z23 Encounter for immunization: Secondary | ICD-10-CM | POA: Diagnosis not present

## 2021-09-15 DIAGNOSIS — F17200 Nicotine dependence, unspecified, uncomplicated: Secondary | ICD-10-CM

## 2021-09-15 DIAGNOSIS — E1159 Type 2 diabetes mellitus with other circulatory complications: Secondary | ICD-10-CM

## 2021-09-15 NOTE — Progress Notes (Signed)
Subjective:  Andre [name] is a 40 y.o. male who presents for Chief Complaint  Patient presents with   med check    Med check- DM      Here for med check.  Lately been having some headaches daily for the last few weeks.  He attributes this to stress.  He has a lot going on personally currently.  He denies vision changes, no blurred vision, no numbness tingling or weakness.  He uses ibuprofen and Tylenol for the headaches intermittently.  Diabetes-not checking blood sugars.  He is compliant with metformin and Farxiga without complaint.  No foot concerns.  No polyuria or polydipsia..  After last visit he did give a trial of Rybelsus but this made him constipated and he subsequently had significant hemorrhoids for about 2 weeks.  Once he stopped Rybelsus the problems went away.  Hypertension-compliant with enalapril, metoprolol.  Hyperlipidemia-compliant with Crestor without complaint.  Compliant with aspirin  Nonfasting today but he had just some fruit and granola today.  History of sleep apnea-CPAP is over 20 years old.  He would like to get updated CPAP and needs updated sleep study.  He wants to use  [facility]  No other aggravating or relieving factors.    No other c/o.  Past Medical History:  Diagnosis Date   Bell's palsy    at 6th or 7th grade on right side    Depression    Diabetes mellitus without complication (HCC)    2017   Elevated LFTs 2017   Hypertension    Obesity    OSA (obstructive sleep apnea)    Current Outpatient Medications on File Prior to Visit  Medication Sig Dispense Refill   dapagliflozin propanediol (FARXIGA) 10 MG TABS tablet Take 1 tablet (10 mg total) by mouth daily before breakfast. 90 tablet 3   enalapril (VASOTEC) 20 MG tablet Take 1 tablet (20 mg total) by mouth daily. 90 tablet 3   glucose blood (BAYER CONTOUR NEXT TEST) test strip Test twice a day as needed. Pt uses contour next ez meter 100 each 11   metFORMIN (GLUCOPHAGE) 1000 MG  tablet Take 1 tablet (1,000 mg total) by mouth 2 (two) times daily with a meal. 180 tablet 3   metoprolol succinate (TOPROL-XL) 50 MG 24 hr tablet TAKE ONE TABLET BY MOUTH DAILY WITH OR IMMEDIATELY FOLLOWING A MEAL 90 tablet 0   MICROLET LANCETS MISC Test daily 100 each 11   rosuvastatin (CRESTOR) 20 MG tablet Take 20 mg by mouth daily.     No current facility-administered medications on file prior to visit.     The following portions of the patient's history were reviewed and updated as appropriate: allergies, current medications, past family history, past medical history, past social history, past surgical history and problem list.  ROS Otherwise as in subjective above  Objective: BP 124/80   Pulse 76   Wt (!) 308 lb 3.2 oz (139.8 kg)   BMI 38.01 kg/m   General appearance: alert, no distress, well developed, well nourished HEENT: normocephalic, sclerae anicteric, conjunctiva pink and moist Neck: supple, no lymphadenopathy, no thyromegaly, no masses, no bruits Heart: RRR, normal S1, S2, no murmurs Lungs: CTA bilaterally, no wheezes, rhonchi, or rales Abdomen: +bs, soft, non tender, non distended, no masses, no hepatomegaly, no splenomegaly, no bruits Pulses: 2+ radial pulses, 2+ pedal pulses, normal cap refill Ext: no edema   Assessment: Encounter Diagnoses  Name Primary?   Type 2 diabetes with complication (HCC) Yes  Frequent headaches    OSA on CPAP    Vaccine counseling    Smoker    Hyperlipidemia, unspecified hyperlipidemia type    Hypertension associated with diabetes (HCC)    Need for Tdap vaccination    Vaccine refused by patient      Plan: Diabetes I recommended he check his blood sugars at home.  He does not check currently.  He does have a glucometer Routine labs today, continue current medications He did not tolerate Rybelsus after last visit  Frequent headaches-we discussed reducing stress for possible, continue good hydration.  Try other analgesics  prn such as Excedrin.  Consider head imaging.  He declines for now  OSA - update sleep study order today.  C/t current CPAP  I recommend you quit tobacco!  Hyperlipidemia - labs today, c/t statin  HTN - c/t current medicaiton  Discussed vaccines - he declines flu, pneumococcal and covid vaccines    [name] was seen today for med check.  Diagnoses and all orders for this visit:  Type 2 diabetes with complication (HCC) -     Hemoglobin A1c -     Microalbumin/Creatinine Ratio, Urine  Frequent headaches -     Sedimentation Rate -     CBC with Differential/Platelet  OSA on CPAP  Vaccine counseling  Smoker  Hyperlipidemia, unspecified hyperlipidemia type -     Comprehensive metabolic panel -     Lipid panel  Hypertension associated with diabetes (HCC) -     Comprehensive metabolic panel  Need for Tdap vaccination -     Tdap vaccine greater than or equal to 7yo IM  Vaccine refused by patient  F/u pending labs

## 2021-09-16 ENCOUNTER — Other Ambulatory Visit: Payer: Self-pay | Admitting: Medical

## 2021-09-16 DIAGNOSIS — E118 Type 2 diabetes mellitus with unspecified complications: Secondary | ICD-10-CM

## 2021-09-16 LAB — CBC WITH DIFFERENTIAL/PLATELET
Basophils Absolute: 0 10*3/uL (ref 0.0–0.2)
Basos: 0 %
EOS (ABSOLUTE): 0.1 10*3/uL (ref 0.0–0.4)
Eos: 2 %
Hematocrit: 50.3 % (ref 37.5–51.0)
Hemoglobin: 17.4 g/dL (ref 13.0–17.7)
Immature Grans (Abs): 0 10*3/uL (ref 0.0–0.1)
Immature Granulocytes: 0 %
Lymphocytes Absolute: 2.1 10*3/uL (ref 0.7–3.1)
Lymphs: 30 %
MCH: 31.7 pg (ref 26.6–33.0)
MCHC: 34.6 g/dL (ref 31.5–35.7)
MCV: 92 fL (ref 79–97)
Monocytes Absolute: 0.7 10*3/uL (ref 0.1–0.9)
Monocytes: 9 %
Neutrophils Absolute: 4 10*3/uL (ref 1.4–7.0)
Neutrophils: 59 %
Platelets: 237 10*3/uL (ref 150–450)
RBC: 5.49 x10E6/uL (ref 4.14–5.80)
RDW: 11.7 % (ref 11.6–15.4)
WBC: 6.9 10*3/uL (ref 3.4–10.8)

## 2021-09-16 LAB — COMPREHENSIVE METABOLIC PANEL
ALT: 35 IU/L (ref 0–44)
AST: 20 IU/L (ref 0–40)
Albumin/Globulin Ratio: 2.2 (ref 1.2–2.2)
Albumin: 4.6 g/dL (ref 4.0–5.0)
Alkaline Phosphatase: 77 IU/L (ref 44–121)
BUN/Creatinine Ratio: 15 (ref 9–20)
BUN: 14 mg/dL (ref 6–24)
Bilirubin Total: 0.4 mg/dL (ref 0.0–1.2)
CO2: 22 mmol/L (ref 20–29)
Calcium: 9.4 mg/dL (ref 8.7–10.2)
Chloride: 99 mmol/L (ref 96–106)
Creatinine, Ser: 0.93 mg/dL (ref 0.76–1.27)
Globulin, Total: 2.1 g/dL (ref 1.5–4.5)
Glucose: 172 mg/dL — ABNORMAL HIGH (ref 65–99)
Potassium: 4.1 mmol/L (ref 3.5–5.2)
Sodium: 136 mmol/L (ref 134–144)
Total Protein: 6.7 g/dL (ref 6.0–8.5)
eGFR: 106 mL/min/{1.73_m2} (ref >59)

## 2021-09-16 LAB — MICROALBUMIN / CREATININE URINE RATIO
Creatinine, Urine: 80.5 mg/dL
Microalb/Creat Ratio: 12 mg/g creat (ref 0–29)
Microalbumin, Urine: 9.7 ug/mL

## 2021-09-16 LAB — LIPID PANEL
Chol/HDL Ratio: 3.5 ratio (ref 0.0–5.0)
Cholesterol, Total: 104 mg/dL (ref 100–199)
HDL: 30 mg/dL — ABNORMAL LOW (ref >39)
LDL Chol Calc (NIH): 52 mg/dL (ref 0–99)
Triglycerides: 120 mg/dL (ref 0–149)
VLDL Cholesterol Cal: 22 mg/dL (ref 5–40)

## 2021-09-16 LAB — HEMOGLOBIN A1C
Est. average glucose Bld gHb Est-mCnc: 203 mg/dL
Hgb A1c MFr Bld: 8.7 % — ABNORMAL HIGH (ref 4.8–5.6)

## 2021-09-16 LAB — SEDIMENTATION RATE: Sed Rate: 10 mm/hr (ref 0–15)

## 2021-09-16 MED ORDER — DAPAGLIFLOZIN PROPANEDIOL 10 MG PO TABS: 10.0000 mg | ORAL_TABLET | Freq: Every day | ORAL | 3 refills | Status: DC

## 2021-09-16 MED ORDER — METFORMIN HCL 1000 MG PO TABS: 1000.0000 mg | ORAL_TABLET | Freq: Two times a day (BID) | ORAL | 3 refills | Status: DC

## 2021-09-16 MED ORDER — ROSUVASTATIN CALCIUM 20 MG PO TABS
20.0000 mg | ORAL_TABLET | Freq: Every day | ORAL | 3 refills | Status: DC
Start: 2021-09-16 — End: 2022-08-30

## 2021-09-16 MED ORDER — ENALAPRIL MALEATE 20 MG PO TABS: 20.0000 mg | ORAL_TABLET | Freq: Every day | ORAL | 3 refills | Status: DC

## 2021-09-17 ENCOUNTER — Other Ambulatory Visit: Payer: Self-pay | Admitting: Medical

## 2021-09-17 MED ORDER — OZEMPIC (0.25 OR 0.5 MG/DOSE) 2 MG/1.5ML ~~LOC~~ SOPN: 0.5000 mg | PEN_INJECTOR | SUBCUTANEOUS | 1 refills | Status: DC

## 2021-10-07 ENCOUNTER — Telehealth: Payer: Self-pay

## 2021-10-07 NOTE — Telephone Encounter (Signed)
Pt called & states he hasn't received his Marcelline Deist and wanted to check on the new medication Ozempic.  Called [place] [employer] [place] t# [phone] & they had tried to fax [place] a refill request in August and it was returned they sent it to the wrong fax number.  They stated Escribe or verbal was the quickest way

## 2021-10-12 MED ORDER — DAPAGLIFLOZIN PROPANEDIOL 10 MG PO TABS: 10.0000 mg | ORAL_TABLET | Freq: Every day | ORAL | 3 refills | Status: DC

## 2021-10-12 NOTE — Telephone Encounter (Signed)
Called [place] [employer] [place] regarding pt's Andre [name] shipment and there was issue with his August shipment they didn't have our correct fax #.  So finally after 3 different hour long phone calls of being on hold I was able to get it expedited shipped & pt should get within 3-5 days.  This is the last shipment before pt's renewal.  Printed out new application and mailed to pt.  Pt informed.

## 2021-10-12 NOTE — Telephone Encounter (Signed)
Called [place] [employer] [place] regarding pt's Farxiga shipment and there was issue with his August shipment they didn't have our correct fax #.  So finally after 3 different hour long phone calls of being on hold I was able to get it expedited shipped & pt should get within 3-5 days.  This is the last shipment before pt's renewal.  Printed out new application and mailed to pt.  Pt informed.   

## 2021-10-16 ENCOUNTER — Telehealth: Payer: Self-pay

## 2021-10-16 NOTE — Telephone Encounter (Signed)
P.A. OZEMPIC 

## 2021-11-05 ENCOUNTER — Telehealth: Payer: Self-pay | Admitting: Medical

## 2021-11-05 NOTE — Telephone Encounter (Signed)
Study shows moderate sleep apnea with oxygen dropping below 88% for 10% of sleep time.  I recommend trial of CPAP along with efforts to lose weight!  If agreeable, refer to home health for CPAP trial, f/u 32mo after starting use

## 2021-11-07 NOTE — Telephone Encounter (Signed)
P.A. approved til 10/07/22

## 2021-11-08 NOTE — Telephone Encounter (Signed)
Pt was notified and I have fax over information to [employer] for cpap supplies

## 2021-11-08 NOTE — Telephone Encounter (Signed)
Left message for pt to call me back 

## 2021-11-11 ENCOUNTER — Encounter: Payer: Self-pay | Admitting: Medical

## 2021-11-16 ENCOUNTER — Telehealth: Payer: Self-pay

## 2021-11-16 NOTE — Telephone Encounter (Signed)
2023 PT ASSISTANCE FARXIGA completed and faxed

## 2021-11-25 NOTE — Telephone Encounter (Signed)
3 boxes samples Marcelline Deist given to pt

## 2021-11-28 ENCOUNTER — Other Ambulatory Visit: Payer: Self-pay | Admitting: Medical

## 2021-11-30 ENCOUNTER — Other Ambulatory Visit: Payer: Self-pay | Admitting: Medical

## 2021-12-08 ENCOUNTER — Telehealth: Payer: Self-pay

## 2021-12-08 ENCOUNTER — Other Ambulatory Visit: Payer: Self-pay | Admitting: Medical

## 2021-12-08 MED ORDER — OZEMPIC (1 MG/DOSE) 4 MG/3ML ~~LOC~~ SOPN: 1.0000 mg | PEN_INJECTOR | SUBCUTANEOUS | 1 refills | Status: DC

## 2021-12-08 NOTE — Telephone Encounter (Signed)
Pt. Wife called stating that the pt. Was not sure if you wanted him to stay on the ozempic or not. He only has about 1/2 a dose left of the ozempic and would need some more called in if he is supposed to stay on that one.

## 2022-01-19 ENCOUNTER — Ambulatory Visit: Payer: 59 | Admitting: Medical

## 2022-01-31 ENCOUNTER — Ambulatory Visit: Payer: 59 | Admitting: Medical

## 2022-01-31 ENCOUNTER — Other Ambulatory Visit: Payer: Self-pay

## 2022-01-31 VITALS — BP 124/76 | HR 76 | Wt 292.4 lb

## 2022-01-31 DIAGNOSIS — G4733 Obstructive sleep apnea (adult) (pediatric): Secondary | ICD-10-CM | POA: Diagnosis not present

## 2022-01-31 DIAGNOSIS — I152 Hypertension secondary to endocrine disorders: Secondary | ICD-10-CM

## 2022-01-31 DIAGNOSIS — Z7185 Encounter for immunization safety counseling: Secondary | ICD-10-CM

## 2022-01-31 DIAGNOSIS — R110 Nausea: Secondary | ICD-10-CM

## 2022-01-31 DIAGNOSIS — E118 Type 2 diabetes mellitus with unspecified complications: Secondary | ICD-10-CM

## 2022-01-31 DIAGNOSIS — E785 Hyperlipidemia, unspecified: Secondary | ICD-10-CM

## 2022-01-31 DIAGNOSIS — F17200 Nicotine dependence, unspecified, uncomplicated: Secondary | ICD-10-CM

## 2022-01-31 DIAGNOSIS — Z9989 Dependence on other enabling machines and devices: Secondary | ICD-10-CM

## 2022-01-31 DIAGNOSIS — Z2820 Immunization not carried out because of patient decision for unspecified reason: Secondary | ICD-10-CM

## 2022-01-31 DIAGNOSIS — E1159 Type 2 diabetes mellitus with other circulatory complications: Secondary | ICD-10-CM

## 2022-01-31 LAB — POCT GLYCOSYLATED HEMOGLOBIN (HGB A1C): Hemoglobin A1C: 6.2 % — AB (ref 4.0–5.6)

## 2022-01-31 MED ORDER — OZEMPIC (1 MG/DOSE) 4 MG/3ML ~~LOC~~ SOPN: 1.0000 mg | PEN_INJECTOR | SUBCUTANEOUS | 5 refills | Status: DC

## 2022-01-31 NOTE — Telephone Encounter (Signed)
Called [place] [employer] [place] t# [phone] regarding status of application & was informed that due to patient having insurance need statement as to why needs patient assistance.   Advised patient can't afford.  Called pharmacy & Marcelline Deist isn't covered had them fax this to me, cost is $678 a month.  Typed letter and faxed to Pt Assistance.  Samples given to hold patient, ok per Vincenza Hews

## 2022-01-31 NOTE — Progress Notes (Signed)
Subjective:  Andre [name] is a 41 y.o. male who presents for Chief Complaint  Patient presents with   follow-up    Follow-up. Declines flu shot and pneumonia shot     Here for diabetes f/u.  Last visit September 2022.  Last visit not at goal  Last visit we continued Farxiga 10mg  daily and Metformin 1000mg  BID,  but added Ozempic.  He has been using ozempic.  Has gotten a fair amount of nausea, but wants to continue this for now.  Hyperlipidemia-compliant with Crestor 40 mg daily without complaint  Hypertension-compliant with Toprol-XL 50 mg daily and enalapril 20 mg daily without complaint  Still using old CPAP.  Had sleep study 09/2021, but awaiting new CPAP device from supplier  He declines vaccines  No other aggravating or relieving factors.    No other c/o.  Past Medical History:  Diagnosis Date   Bell's palsy    at 6th or 7th grade on right side    Depression    Diabetes mellitus without complication ([place])    0000000   Elevated LFTs 2017   Hypertension    Obesity    OSA (obstructive sleep apnea)    Current Outpatient Medications on File Prior to Visit  Medication Sig Dispense Refill   dapagliflozin propanediol (FARXIGA) 10 MG TABS tablet Take 1 tablet (10 mg total) by mouth daily before breakfast. 90 tablet 3   enalapril (VASOTEC) 20 MG tablet Take 1 tablet (20 mg total) by mouth daily. 90 tablet 3   metFORMIN (GLUCOPHAGE) 1000 MG tablet Take 1 tablet (1,000 mg total) by mouth 2 (two) times daily with a meal. 180 tablet 3   metoprolol succinate (TOPROL-XL) 50 MG 24 hr tablet TAKE ONE TABLET BY MOUTH DAILY WITH OR IMMEDIATELY FOLLOWING A MEAL 90 tablet 1   rosuvastatin (CRESTOR) 20 MG tablet Take 1 tablet (20 mg total) by mouth daily. 90 tablet 3   Semaglutide, 1 MG/DOSE, (OZEMPIC, 1 MG/DOSE,) 4 MG/3ML SOPN Inject 1 mg into the skin once a week. 3 mL 1   glucose blood (BAYER CONTOUR NEXT TEST) test strip Test twice a day as needed. Pt uses contour next ez  meter 100 each 11   MICROLET LANCETS MISC Test daily 100 each 11   No current facility-administered medications on file prior to visit.     The following portions of the patient's history were reviewed and updated as appropriate: allergies, current medications, past family history, past medical history, past social history, past surgical history and problem list.  ROS Otherwise as in subjective above    Objective: BP 124/76    Pulse 76    Wt 292 lb 6.4 oz (132.6 kg)    BMI 36.07 kg/m   Wt Readings from Last 3 Encounters:  01/31/22 292 lb 6.4 oz (132.6 kg)  09/15/21 (!) 308 lb 3.2 oz (139.8 kg)  03/08/21 (!) 317 lb 6.4 oz (144 kg)   General appearance: alert, no distress, well developed, well nourished Heart: RRR, normal S1, S2, no murmurs Lungs: CTA bilaterally, no wheezes, rhonchi, or rales Pulses: 2+ radial pulses, 2+ pedal pulses, normal cap refill Ext: no edema   Assessment: Encounter Diagnoses  Name Primary?   Type 2 diabetes with complication (HCC) Yes   OSA on CPAP    Smoker    Hypertension associated with diabetes ([place])    Hyperlipidemia, unspecified hyperlipidemia type    Vaccine refused by patient    Vaccine counseling    Nausea  Plan: Diabetes type 2-pending appointment, new patient consult with endocrinologist soon.  6.2% today.  He has had a lot of nausea.  Gave options for going to 0.5mg  ozempic, trulicity or other but he wants to leave current medications the same overall.  Continue efforts to eat small potions, exercise, work on additional weight loss efforts.  Hyperlipidemia-continue statin.  I reviewed his lipids from September 2022  Hypertension-continue Toprol XL and enalapril,  Blood pressure at goal  I reviewed labs from September 2020.  He has microalbuminuria screen was normal.  Statin reviewed from that visit as well.  OSA -last visit we scheduled updated sleep study as his CPAP machine was over 37 years old.  He wants to use [facility].  I reviewed his October 2022 sleep study, and we had referred for new CPAP supplies.  Discussed results including AHI around 12, oxygen nadir 74%, and below 88% oxygen sat for 10%.   Needs to continue CPAP.  He has been in contact with supplier, and new CPAP has been ordered.   Advised that if he continues to lose weight, we may need to adjust CPAP pressure and BP medications.    Counseled on the need for flu and pneumonia vaccines.  He declines both.    Andre [name] was seen today for follow-up.  Diagnoses and all orders for this visit:  Type 2 diabetes with complication ([place]) -     HgB A1c  OSA on CPAP  Smoker  Hypertension associated with diabetes ([place])  Hyperlipidemia, unspecified hyperlipidemia type  Vaccine refused by patient  Vaccine counseling  Nausea    Follow up: with endocrinology soon as scheduled.

## 2022-02-10 ENCOUNTER — Telehealth: Payer: 59 | Admitting: Physician Assistant

## 2022-02-10 ENCOUNTER — Encounter: Payer: Self-pay | Admitting: Physician Assistant

## 2022-02-10 VITALS — Ht 75.0 in | Wt 292.0 lb

## 2022-02-10 DIAGNOSIS — A084 Viral intestinal infection, unspecified: Secondary | ICD-10-CM

## 2022-02-10 DIAGNOSIS — R197 Diarrhea, unspecified: Secondary | ICD-10-CM | POA: Diagnosis not present

## 2022-02-10 DIAGNOSIS — R112 Nausea with vomiting, unspecified: Secondary | ICD-10-CM | POA: Diagnosis not present

## 2022-02-10 MED ORDER — DIPHENOXYLATE-ATROPINE 2.5-0.025 MG PO TABS: 1.0000 | ORAL_TABLET | Freq: Four times a day (QID) | ORAL | 0 refills | Status: DC | PRN

## 2022-02-10 MED ORDER — ONDANSETRON HCL 4 MG PO TABS: 4.0000 mg | ORAL_TABLET | Freq: Three times a day (TID) | ORAL | 0 refills | Status: DC | PRN

## 2022-02-10 NOTE — Progress Notes (Signed)
° °  Virtual Visit via Video Note   Patient ID: Andre [name], male    DOB: 04-11-1981, 41 y.o.   MRN: [id number]  I connected with Gee Habig on 02/10/22 by a video enabled telemedicine application and verified that I am speaking with the correct person using two identifiers.  Location: Patient: home Provider: office   I discussed the limitations of evaluation and management by telemedicine and the availability of in person appointments. The patient expressed understanding and agreed to proceed.  History of Present Illness:  Chief Complaint  Patient presents with   other    Vomiting, diarrhea, since last Friday, no fever, loss of taste, cough, burping constantly, stomach pain, BS was 114 this morning.    He states he's had abdominal pain, heartburn, belching that smells like rotten eggs, several episodes of diarrhea without blood and nausea with vomiting x 7 days; denies taking any OTC medicine for nausea or diarrhea; has taken a few Tums; denies sick contact; denies out of state or country travel;  Denies fever / chills / constipation; has a decreased appetite; denies coughing.     Observations/Objective:  Ht 6\' 3"  (1.905 m)    Wt 292 lb (132.5 kg)    BMI 36.50 kg/m    Assessment: Encounter Diagnoses  Name Primary?   Nausea and vomiting, unspecified vomiting type Yes   Diarrhea, unspecified type    Viral gastroenteritis      Plan: Nausea / vomiting / diarrhea / viral GE - increase liquids, bland diet, OTC Tums as needed; patient declined to try OTC PPI; patient declined to come in for a rapid COVID test; rx for Zofran and Lomotil ordered; if no improvement or if worse, go to Urgent Care or Emergency Department for full evaluation and IV fluids for dehydration.   Follow up: with PCP as needed  I discussed the assessment and treatment plan with the patient. The patient was provided an opportunity to ask questions and all were answered. The patient agreed with  the plan and demonstrated an understanding of the instructions.   The patient was advised to call back or seek an in-person evaluation if the symptoms worsen or if the condition fails to improve as anticipated.  I spent 15 minutes dedicated to the care of this patient, including pre-visit review of records, face to face time, post-visit ordering of testing and documentation.   , PA-C

## 2022-02-14 ENCOUNTER — Ambulatory Visit: Payer: 59 | Admitting: Internal Medicine

## 2022-02-28 ENCOUNTER — Telehealth: Payer: Self-pay

## 2022-02-28 NOTE — Telephone Encounter (Signed)
error 

## 2022-02-28 NOTE — Telephone Encounter (Signed)
Recv'd letter from [facility] stating needs documentation that pt spent 10% of income on medical expenses, pt informed

## 2022-03-23 ENCOUNTER — Telehealth: Payer: Self-pay | Admitting: Internal Medicine

## 2022-03-23 DIAGNOSIS — G4733 Obstructive sleep apnea (adult) (pediatric): Secondary | ICD-10-CM

## 2022-03-23 NOTE — Telephone Encounter (Signed)
Spoke to patient and hes going to see what he can do at the urgent care. [employer] [facility] called him and he is making a payment that way they can ship him out a new one this week.  ?

## 2022-03-23 NOTE — Telephone Encounter (Signed)
Pt came by today and trying to get his Cpap compliance downloaded but we are having trouble downloading it as his device is too old. I spoke to matt with [employer] as patient mailed his sims card off to them 11 days ago and they haven't received it. Is there a way that we can give him an extension as his DOT ran out yesterday. I am putting in a new order ASAP for new machine. There is nothing we can do on our side about this sims card as it will not load to [employer] system.  ?

## 2022-04-14 ENCOUNTER — Telehealth: Payer: Self-pay | Admitting: Internal Medicine

## 2022-04-14 NOTE — Telephone Encounter (Signed)
Pt was notified that once hes been on his new cpap machine from [employer] that he needs to follow-up within 31-90 days ?

## 2022-06-05 ENCOUNTER — Other Ambulatory Visit: Payer: Self-pay | Admitting: Medical

## 2022-06-09 ENCOUNTER — Telehealth: Payer: Self-pay | Admitting: Medical

## 2022-06-09 MED ORDER — DAPAGLIFLOZIN PROPANEDIOL 10 MG PO TABS: 10.0000 mg | ORAL_TABLET | Freq: Every day | ORAL | 0 refills | Status: DC

## 2022-06-09 NOTE — Telephone Encounter (Signed)
Pt coming to pick up meds

## 2022-06-09 NOTE — Telephone Encounter (Signed)
Andre [name] asking for samples of farxiga.

## 2022-07-25 ENCOUNTER — Other Ambulatory Visit: Payer: Self-pay | Admitting: Medical

## 2022-08-01 ENCOUNTER — Telehealth: Payer: Self-pay | Admitting: Family Medicine

## 2022-08-01 NOTE — Telephone Encounter (Signed)
error 

## 2022-08-16 ENCOUNTER — Ambulatory Visit: Payer: 59 | Admitting: Medical

## 2022-08-16 VITALS — BP 122/80 | HR 76 | Wt 276.8 lb

## 2022-08-16 DIAGNOSIS — E118 Type 2 diabetes mellitus with unspecified complications: Secondary | ICD-10-CM

## 2022-08-16 DIAGNOSIS — Z2820 Immunization not carried out because of patient decision for unspecified reason: Secondary | ICD-10-CM | POA: Diagnosis not present

## 2022-08-16 DIAGNOSIS — E1159 Type 2 diabetes mellitus with other circulatory complications: Secondary | ICD-10-CM

## 2022-08-16 DIAGNOSIS — Z7185 Encounter for immunization safety counseling: Secondary | ICD-10-CM

## 2022-08-16 DIAGNOSIS — E785 Hyperlipidemia, unspecified: Secondary | ICD-10-CM | POA: Diagnosis not present

## 2022-08-16 DIAGNOSIS — R300 Dysuria: Secondary | ICD-10-CM

## 2022-08-16 DIAGNOSIS — I152 Hypertension secondary to endocrine disorders: Secondary | ICD-10-CM

## 2022-08-16 DIAGNOSIS — M79672 Pain in left foot: Secondary | ICD-10-CM

## 2022-08-16 NOTE — Progress Notes (Signed)
Subjective:  Andre [name] is a 41 y.o. male who presents for Chief Complaint  Patient presents with   med check    Med check      Here for med check  He notes a few weeks ago he had some dysuria that lasted a few days.  He increase his water intake and this went away.  It started as urinary discomfort and discomfort in his back.  This resolved.  No fever no foul odor in the urine.  No concern for STD.  Hypertension-compliant with medications without complaint.  No chest pain or swelling  Hyperlipidemia-compliant with statin without complaint  Diabetes-he is not checking his blood sugars.  He does not want to check his blood sugars at this time.  He is compliant with Ozempic, metformin and Andre [name]  He continues to lose weight on Ozempic and Exercise.  He does not have much of an appetite since being on the Ozempic.  He does not want me much at all.  No other aggravating or relieving factors.    No other c/o.  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 49 tablet 0   enalapril (VASOTEC) 20 MG tablet Take 1 tablet (20 mg total) by mouth daily. 90 tablet 3   metFORMIN (GLUCOPHAGE) 1000 MG tablet Take 1 tablet (1,000 mg total) by mouth 2 (two) times daily with a meal. 180 tablet 3   metoprolol succinate (TOPROL-XL) 50 MG 24 hr tablet TAKE ONE TABLET BY MOUTH DAILY WITH OR IMMEDIATELY FOLLOWING A MEAL 90 tablet 0   OZEMPIC, 1 MG/DOSE, 4 MG/3ML SOPN DIAL AND INJECT UNDER THE SKIN 1 MG WEEKLY 3 mL 5   rosuvastatin (CRESTOR) 20 MG tablet Take 1 tablet (20 mg total) by mouth  daily. 90 tablet 3   glucose blood (BAYER CONTOUR NEXT TEST) test strip Test twice a day as needed. Pt uses contour next ez meter 100 each 11   MICROLET LANCETS MISC Test daily 100 each 11   No current facility-administered medications on file prior to visit.     The following portions of the patient's history were reviewed and updated as appropriate: allergies, current medications, past family history, past medical history, past social history, past surgical history and problem list.  ROS Otherwise as in subjective above  Objective: BP 122/80   Pulse 76   Wt 276 lb 12.8 oz (125.6 kg)   BMI 34.60 kg/m   General appearance: alert, no distress, well developed, well nourished Neck: supple, no lymphadenopathy, no thyromegaly, no masses Heart: RRR, normal S1, S2, no murmurs Lungs: CTA bilaterally, no wheezes, rhonchi, or rales Pulses: 2+ radial pulses, 2+ pedal pulses, normal cap refill Ext: no edema  Diabetic Foot Exam - Simple   Simple Foot Form Diabetic Foot exam was performed with the following findings: Yes 08/16/2022  9:02 AM  Visual Inspection See comments: Yes Sensation Testing Intact to touch and monofilament testing bilaterally: Yes Pulse Check Posterior Tibialis and Dorsalis pulse intact bilaterally: Yes Comments There  is some callus of the medial great toe distally and laterally bilateral feet       Assessment: Encounter Diagnoses  Name Primary?   Type 2 diabetes with complication (HCC) Yes   Hypertension associated with diabetes (HCC)    Hyperlipidemia, unspecified hyperlipidemia type    Vaccine refused by patient    Vaccine counseling    Foot pain, left    Dysuria      Plan: Diabetes Not currently checking sugars and he does not want to check sugars at this point. Continue metformin 1000 mg twice daily, Ozempic weekly.  Consider changing Andre [name] to Andre [name] or possibly discontinuing this depending on labs.  We again discussed risk and benefits of  SGLT2 medication.  He has had some recent dysuria Routine labs today Advised daily foot checks Advised yearly eye doctor visit which he is behind on  Hypertension Blood pressures look good Continue metoprolol XL 50 mg daily Continue enalapril 20 mg daily  Hyperlipidemia Continue rosuvastatin 20 mg daily Routine labs today  We discussed his pain in the left foot.  This could represent neuropathy or other.  He declines nerve conduction test.  I recommended for the time being he try foot support such as foot insert in his boots, consider cool water therapy, discussed massage, discussed topical ointment such as lavender essential oil.  If not improving over the next month consider trial of gabapentin or referral for nerve conduction studies  He declines pneumococcal vaccine today  Andre [name] was seen today for med check.  Diagnoses and all orders for this visit:  Type 2 diabetes with complication (HCC) -     Comprehensive metabolic panel -     Microalbumin/Creatinine Ratio, Urine -     Hemoglobin A1c  Hypertension associated with diabetes (HCC) -     Comprehensive metabolic panel -     CBC  Hyperlipidemia, unspecified hyperlipidemia type -     Lipid panel  Vaccine refused by patient  Vaccine counseling  Foot pain, left  Dysuria -     CBC -     Urinalysis    Follow up: pending labs

## 2022-08-17 LAB — COMPREHENSIVE METABOLIC PANEL
ALT: 30 IU/L (ref 0–44)
AST: 27 IU/L (ref 0–40)
Albumin/Globulin Ratio: 2.1 (ref 1.2–2.2)
Albumin: 4.6 g/dL (ref 4.1–5.1)
Alkaline Phosphatase: 88 IU/L (ref 44–121)
BUN/Creatinine Ratio: 11 (ref 9–20)
BUN: 11 mg/dL (ref 6–24)
Bilirubin Total: 0.6 mg/dL (ref 0.0–1.2)
CO2: 21 mmol/L (ref 20–29)
Calcium: 9.3 mg/dL (ref 8.7–10.2)
Chloride: 102 mmol/L (ref 96–106)
Creatinine, Ser: 0.99 mg/dL (ref 0.76–1.27)
Globulin, Total: 2.2 g/dL (ref 1.5–4.5)
Glucose: 88 mg/dL (ref 70–99)
Potassium: 4.2 mmol/L (ref 3.5–5.2)
Sodium: 139 mmol/L (ref 134–144)
Total Protein: 6.8 g/dL (ref 6.0–8.5)
eGFR: 98 mL/min/{1.73_m2} (ref >59)

## 2022-08-17 LAB — MICROALBUMIN / CREATININE URINE RATIO
Creatinine, Urine: 224.6 mg/dL
Microalb/Creat Ratio: 20 mg/g creat (ref 0–29)
Microalbumin, Urine: 44.4 ug/mL

## 2022-08-17 LAB — CBC
Hematocrit: 50.4 % (ref 37.5–51.0)
Hemoglobin: 17.5 g/dL (ref 13.0–17.7)
MCH: 32.4 pg (ref 26.6–33.0)
MCHC: 34.7 g/dL (ref 31.5–35.7)
MCV: 93 fL (ref 79–97)
Platelets: 228 10*3/uL (ref 150–450)
RBC: 5.4 x10E6/uL (ref 4.14–5.80)
RDW: 11.9 % (ref 11.6–15.4)
WBC: 8.8 10*3/uL (ref 3.4–10.8)

## 2022-08-17 LAB — URINALYSIS
Bilirubin, UA: NEGATIVE
Ketones, UA: NEGATIVE
Leukocytes,UA: NEGATIVE
Nitrite, UA: NEGATIVE
RBC, UA: NEGATIVE
Specific Gravity, UA: >=1.03 — AB (ref 1.005–1.030)
Urobilinogen, Ur: 0.2 mg/dL (ref 0.2–1.0)
pH, UA: 5.5 (ref 5.0–7.5)

## 2022-08-17 LAB — LIPID PANEL
Chol/HDL Ratio: 2.5 ratio (ref 0.0–5.0)
Cholesterol, Total: 87 mg/dL — ABNORMAL LOW (ref 100–199)
HDL: 35 mg/dL — ABNORMAL LOW (ref >39)
LDL Chol Calc (NIH): 38 mg/dL (ref 0–99)
Triglycerides: 64 mg/dL (ref 0–149)
VLDL Cholesterol Cal: 14 mg/dL (ref 5–40)

## 2022-08-17 LAB — HEMOGLOBIN A1C
Est. average glucose Bld gHb Est-mCnc: 128 mg/dL
Hgb A1c MFr Bld: 6.1 % — ABNORMAL HIGH (ref 4.8–5.6)

## 2022-08-30 ENCOUNTER — Other Ambulatory Visit: Payer: Self-pay | Admitting: Medical

## 2022-09-07 ENCOUNTER — Other Ambulatory Visit: Payer: Self-pay | Admitting: Medical

## 2022-09-10 ENCOUNTER — Telehealth: Payer: Self-pay | Admitting: Medical

## 2022-09-10 NOTE — Telephone Encounter (Unsigned)
P,A, OZEMPIC RENEWAL

## 2022-09-13 NOTE — Telephone Encounter (Signed)
Faxed records for P.A.

## 2022-09-14 NOTE — Telephone Encounter (Signed)
P.A. approved til 09/14/23

## 2022-09-15 NOTE — Telephone Encounter (Signed)
pt informed of approval

## 2022-09-22 ENCOUNTER — Other Ambulatory Visit: Payer: Self-pay | Admitting: Medical

## 2022-11-23 ENCOUNTER — Telehealth: Payer: Self-pay | Admitting: Medical

## 2022-11-23 NOTE — Telephone Encounter (Signed)
Pt called requesting Farxiga samples   Please call

## 2022-11-24 MED ORDER — DAPAGLIFLOZIN PROPANEDIOL 10 MG PO TABS: 10.0000 mg | ORAL_TABLET | Freq: Every day | ORAL | 0 refills | Status: DC

## 2022-11-24 NOTE — Telephone Encounter (Signed)
Left message for pt to call me back. Samples are ready. Due for a 6 month med check in February 2024

## 2022-12-12 ENCOUNTER — Telehealth: Payer: Self-pay | Admitting: Internal Medicine

## 2022-12-12 MED ORDER — METOPROLOL SUCCINATE ER 50 MG PO TB24: ORAL_TABLET | ORAL | 1 refills | Status: DC

## 2022-12-12 NOTE — Telephone Encounter (Signed)
Refill request for metoprolol 50mg 

## 2023-01-20 ENCOUNTER — Other Ambulatory Visit: Payer: Self-pay | Admitting: Medical

## 2023-01-20 NOTE — Telephone Encounter (Signed)
Has upcoming appointment

## 2023-02-01 ENCOUNTER — Ambulatory Visit: Payer: 59 | Admitting: Medical

## 2023-02-01 VITALS — BP 124/64 | HR 84 | Wt 282.6 lb

## 2023-02-01 DIAGNOSIS — Z2820 Immunization not carried out because of patient decision for unspecified reason: Secondary | ICD-10-CM | POA: Diagnosis not present

## 2023-02-01 DIAGNOSIS — E1159 Type 2 diabetes mellitus with other circulatory complications: Secondary | ICD-10-CM

## 2023-02-01 DIAGNOSIS — I152 Hypertension secondary to endocrine disorders: Secondary | ICD-10-CM

## 2023-02-01 DIAGNOSIS — E118 Type 2 diabetes mellitus with unspecified complications: Secondary | ICD-10-CM

## 2023-02-01 DIAGNOSIS — E785 Hyperlipidemia, unspecified: Secondary | ICD-10-CM

## 2023-02-01 DIAGNOSIS — M2141 Flat foot [pes planus] (acquired), right foot: Secondary | ICD-10-CM

## 2023-02-01 DIAGNOSIS — M2142 Flat foot [pes planus] (acquired), left foot: Secondary | ICD-10-CM

## 2023-02-01 LAB — POCT GLYCOSYLATED HEMOGLOBIN (HGB A1C): Hemoglobin A1C: 6.2 % — AB (ref 4.0–5.6)

## 2023-02-01 MED ORDER — ENALAPRIL MALEATE 20 MG PO TABS: 20.0000 mg | ORAL_TABLET | Freq: Every day | ORAL | 1 refills | Status: DC

## 2023-02-01 MED ORDER — OZEMPIC (1 MG/DOSE) 4 MG/3ML ~~LOC~~ SOPN: PEN_INJECTOR | SUBCUTANEOUS | 2 refills | Status: DC

## 2023-02-01 NOTE — Progress Notes (Addendum)
Subjective:  Andre [name] is a 42 y.o. male who presents for Chief Complaint  Patient presents with   fasting  med check    Fasting med check , no concerns     Hypertension-compliant with medications without complaint.  No chest pain or swelling  Hyperlipidemia-compliant with statin without complaint  Diabetes- He is compliant with Ozempic, metformin and Farxiga  No other aggravating or relieving factors.    No other c/o.  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 49 tablet 0   metFORMIN (GLUCOPHAGE) 1000 MG tablet TAKE ONE TABLET BY MOUTH TWICE A DAY WITH A MEAL 180 tablet 3   metoprolol succinate (TOPROL-XL) 50 MG 24 hr tablet TAKE 1 TABLET BY MOUTH DAILY WITH OR IMMEDIATELY FOLLOWING A MEAL 90 tablet 1   rosuvastatin (CRESTOR) 20 MG tablet TAKE ONE TABLET BY MOUTH DAILY 90 tablet 3   glucose blood (BAYER CONTOUR NEXT TEST) test strip Test twice a day as needed. Pt uses contour next ez meter 100 each 11   MICROLET LANCETS MISC Test daily 100 each 11   No current facility-administered medications on file prior to visit.     The following portions of the patient's history were reviewed and updated as appropriate: allergies, current medications, past family history, past medical history, past social history, past surgical history and problem list.  ROS Otherwise as in subjective above  Objective: BP 124/64   Pulse 84   Wt 282 lb 9.6 oz (128.2 kg)   BMI 35.32 kg/m   Wt Readings from Last 3 Encounters:  02/01/23 282 lb 9.6 oz (128.2 kg)  08/16/22 276 lb  12.8 oz (125.6 kg)  02/10/22 292 lb (132.5 kg)   BP Readings from Last 3 Encounters:  02/01/23 124/64  08/16/22 122/80  01/31/22 124/76   General appearance: alert, no distress, well developed, well nourished Neck: supple, no lymphadenopathy, no thyromegaly, no masses Heart: RRR, normal S1, S2, no murmurs Lungs: CTA bilaterally, no wheezes, rhonchi, or rales Pulses: 2+ radial pulses, 2+ pedal pulses, normal cap refill Ext: no edema  Diabetic Foot Exam - Simple   Simple Foot Form Diabetic Foot exam was performed with the following findings: Yes 02/01/2023  9:39 AM  Visual Inspection No deformities, no ulcerations, no other skin breakdown bilaterally: Yes Sensation Testing Intact to touch and monofilament testing bilaterally: Yes Pulse Check Posterior Tibialis and Dorsalis pulse intact bilaterally: Yes Comments       Assessment: Encounter Diagnoses  Name Primary?   Type 2 diabetes with complication (HCC) Yes   Hypertension associated with diabetes ([place])    Hyperlipidemia, unspecified hyperlipidemia type    Vaccine refused by patient    Pes planus of both feet       Plan: Diabetes HbgA1C 6.2% today Not currently checking sugars and he does not want to check sugars at this point. Continue metformin 1000 mg twice daily, Ozempic weekly. Continue Farxiga. Routine labs today Advised daily foot checks Advised yearly eye doctor visit which he  is behind on  Hypertension Blood pressures look good Continue metoprolol XL 50 mg daily Continue enalapril 20 mg daily  Hyperlipidemia Continue rosuvastatin 20 mg daily  Declines vaccines.    Andre [name] was seen today for fasting  med check.  Diagnoses and all orders for this visit:  Type 2 diabetes with complication ([place]) -     Ambulatory referral to Ophthalmology  Hypertension associated with diabetes ([place])  Hyperlipidemia, unspecified hyperlipidemia type  Vaccine refused by patient  Pes planus of both  feet  Other orders -     enalapril (VASOTEC) 20 MG tablet; Take 1 tablet (20 mg total) by mouth daily. -     Semaglutide, 1 MG/DOSE, (OZEMPIC, 1 MG/DOSE,) 4 MG/3ML SOPN; DIAL AND INJECT UNDER THE SKIN 1 MG WEEKLY   Follow up in 72mo for fasting physical

## 2023-02-01 NOTE — Addendum Note (Signed)
Addended by: Carlena Hurl on: 02/01/2023 09:46 AM   Modules accepted: Orders

## 2023-02-16 ENCOUNTER — Telehealth: Payer: Self-pay | Admitting: Internal Medicine

## 2023-02-16 NOTE — Telephone Encounter (Signed)
This has already been appt from 09/13/22-09/14/23

## 2023-02-16 NOTE — Telephone Encounter (Signed)
P.A. for Ozempic sent through covermymeds. Waiting on response

## 2023-06-18 ENCOUNTER — Other Ambulatory Visit: Payer: Self-pay | Admitting: Medical

## 2023-06-26 ENCOUNTER — Telehealth: Payer: Self-pay | Admitting: Internal Medicine

## 2023-06-26 MED ORDER — DAPAGLIFLOZIN PROPANEDIOL 10 MG PO TABS: 10.0000 mg | ORAL_TABLET | Freq: Every day | ORAL | 0 refills | Status: DC

## 2023-06-26 NOTE — Telephone Encounter (Signed)
Pt called and asked for samples of farixga. Pt will come pick up samples. He has an appt in August

## 2023-08-10 ENCOUNTER — Encounter: Payer: 59 | Admitting: Medical

## 2023-08-14 ENCOUNTER — Other Ambulatory Visit: Payer: Self-pay | Admitting: Medical

## 2023-09-02 ENCOUNTER — Other Ambulatory Visit: Payer: Self-pay | Admitting: Medical

## 2023-09-13 ENCOUNTER — Ambulatory Visit: Payer: 59 | Admitting: Medical

## 2023-09-13 ENCOUNTER — Other Ambulatory Visit: Payer: Self-pay | Admitting: Medical

## 2023-09-13 VITALS — BP 138/82 | HR 92 | Ht 75.0 in | Wt 258.6 lb

## 2023-09-13 DIAGNOSIS — E1159 Type 2 diabetes mellitus with other circulatory complications: Secondary | ICD-10-CM | POA: Diagnosis not present

## 2023-09-13 DIAGNOSIS — I152 Hypertension secondary to endocrine disorders: Secondary | ICD-10-CM

## 2023-09-13 DIAGNOSIS — Z0000 Encounter for general adult medical examination without abnormal findings: Secondary | ICD-10-CM | POA: Insufficient documentation

## 2023-09-13 DIAGNOSIS — E118 Type 2 diabetes mellitus with unspecified complications: Secondary | ICD-10-CM | POA: Diagnosis not present

## 2023-09-13 DIAGNOSIS — G4733 Obstructive sleep apnea (adult) (pediatric): Secondary | ICD-10-CM

## 2023-09-13 DIAGNOSIS — F17200 Nicotine dependence, unspecified, uncomplicated: Secondary | ICD-10-CM

## 2023-09-13 DIAGNOSIS — E785 Hyperlipidemia, unspecified: Secondary | ICD-10-CM | POA: Diagnosis not present

## 2023-09-13 DIAGNOSIS — Z7185 Encounter for immunization safety counseling: Secondary | ICD-10-CM

## 2023-09-13 DIAGNOSIS — Z2820 Immunization not carried out because of patient decision for unspecified reason: Secondary | ICD-10-CM

## 2023-09-13 LAB — POCT URINALYSIS DIP (PROADVANTAGE DEVICE)
Bilirubin, UA: NEGATIVE
Blood, UA: NEGATIVE
Glucose, UA: >=1000 mg/dL — AB
Ketones, POC UA: NEGATIVE mg/dL
Leukocytes, UA: NEGATIVE
Nitrite, UA: NEGATIVE
Protein Ur, POC: NEGATIVE mg/dL
Specific Gravity, Urine: 1.015
Urobilinogen, Ur: NEGATIVE
pH, UA: 6

## 2023-09-13 NOTE — Progress Notes (Signed)
Subjective:   HPI  Andre [name] is a 42 y.o. male who presents for Chief Complaint  Patient presents with   Annual Exam    Fasting cpe, decline flu and covid shot, no eye exam in last year    Patient Care Team: Clare Casto, Kermit Balo, PA-C as PCP - General (Family Medicine) Dentist No eye doctor   Concerns: Diabetes-compliant with Farxiga 10 mg daily, metformin 1000 mg twice daily, Ozempic 1 mg weekly.  Checks sugars sometimes, mostly in the 100s, occasional 200.   He would like to get off metformin in general.    Hyperlipidemia-compliant with Crestor 20 mg daily without complaint  Hypertension-compliant with enalapril 20 mg daily Toprol-XL 50 mg daily  OSA- using CPAP regularly  Still smokes, no plans to give this up.  He has some burning/tingling in left lateral foot  Has some right ear pressure, hx/o bells, hx/o ear infection  Reviewed their medical, surgical, family, social, medication, and allergy history and updated chart as appropriate.  Allergies  Allergen Reactions   Influenza Vaccines     Illness x last 3 vaccines   Janumet [Sitagliptin-Metformin Hcl]     Nausea and vomiting   Morphine And Codeine    Other     Rybelsus - constipation, hemorrhoids   Tramadol     Past Medical History:  Diagnosis Date   Bell's palsy    at 6th or 7th grade on right side    Depression    Diabetes mellitus without complication (HCC)    2017   Elevated LFTs 2017   Hypertension    Obesity    OSA (obstructive sleep apnea)     Current Outpatient Medications:    dapagliflozin propanediol (FARXIGA) 10 MG TABS tablet, Take 1 tablet (10 mg total) by mouth daily before breakfast., Disp: 30 tablet, Rfl: 0   enalapril (VASOTEC) 20 MG tablet, TAKE 1 TABLET BY MOUTH DAILY, Disp: 30 tablet, Rfl: 0   glucose blood (BAYER CONTOUR NEXT TEST) test strip, Test twice a day as needed. Pt uses contour next ez meter, Disp: 100 each, Rfl: 11   metFORMIN (GLUCOPHAGE) 1000 MG tablet,  TAKE ONE TABLET BY MOUTH TWICE A DAY WITH A MEAL, Disp: 180 tablet, Rfl: 3   metoprolol succinate (TOPROL-XL) 50 MG 24 hr tablet, TAKE 1 TABLET BY MOUTH DAILY WITH OR IMMEDIATELY FOLLOWING A MEAL, Disp: 30 tablet, Rfl: 1   rosuvastatin (CRESTOR) 20 MG tablet, TAKE ONE TABLET BY MOUTH DAILY, Disp: 90 tablet, Rfl: 3   Semaglutide, 1 MG/DOSE, (OZEMPIC, 1 MG/DOSE,) 4 MG/3ML SOPN, DIAL AND INJECT UNDER THE SKIN 1 MG WEEKLY, Disp: 15 mL, Rfl: 2   MICROLET LANCETS MISC, Test daily, Disp: 100 each, Rfl: 11  Family History  Problem Relation Age of Onset   Heart disease Mother 55   Diabetes Mother    Diabetes Father        IDDM, amputation   Kidney disease Father        kidney transplant   Cancer Father    Heart disease Sister        congenital    Past Surgical History:  Procedure Laterality Date   CERVICAL FUSION     SKIN GRAFT Right    below right knee, medial     Review of Systems  Constitutional:  Negative for chills, fever, malaise/fatigue and weight loss.  HENT:  Negative for congestion, ear pain, hearing loss, sore throat and tinnitus.        Ear  pressure, right  Eyes:  Negative for blurred vision, pain and redness.  Respiratory:  Negative for cough, hemoptysis and shortness of breath.   Cardiovascular:  Negative for chest pain, palpitations, orthopnea, claudication and leg swelling.  Gastrointestinal:  Negative for abdominal pain, blood in stool, constipation, diarrhea, nausea and vomiting.  Genitourinary:  Negative for dysuria, flank pain, frequency, hematuria and urgency.  Musculoskeletal:  Negative for falls, joint pain and myalgias.  Skin:  Negative for itching and rash.  Neurological:  Positive for tingling. Negative for dizziness, speech change, weakness and headaches.  Endo/Heme/Allergies:  Negative for polydipsia. Does not bruise/bleed easily.  Psychiatric/Behavioral:  Negative for depression and memory loss. The patient is not nervous/anxious and does not have insomnia.         Objective:  BP 138/82   Pulse 92   Ht 6\' 3"  (1.905 m)   Wt 258 lb 9.6 oz (117.3 kg)   BMI 32.32 kg/m   Wt Readings from Last 3 Encounters:  09/13/23 258 lb 9.6 oz (117.3 kg)  02/01/23 282 lb 9.6 oz (128.2 kg)  08/16/22 276 lb 12.8 oz (125.6 kg)   BP Readings from Last 3 Encounters:  09/13/23 138/82  02/01/23 124/64  08/16/22 122/80    General appearance: alert, no distress, WD/WN, Caucasian male Skin: unremarkable HEENT: normocephalic, conjunctiva/corneas normal, sclerae anicteric, PERRLA, EOMi, bilateral TMs retracted, some slight air-fluid levels noted, nares patent, no discharge or erythema, pharynx normal Oral cavity: MMM, tongue normal, teeth normal Neck: supple, no lymphadenopathy, no thyromegaly, no masses, normal ROM, no bruits Chest: non tender, normal shape and expansion Heart: RRR, normal S1, S2, no murmurs Lungs: Somewhat coarse breath sounds in general, few faint wheezes upper fields, rhonchi, or rales Abdomen: +bs, soft, non tender, non distended, no masses, no hepatomegaly, no splenomegaly, no bruits Back: non tender, normal ROM, no scoliosis Musculoskeletal: upper extremities non tender, no obvious deformity, normal ROM throughout, lower extremities non tender, no obvious deformity, normal ROM throughout Extremities: no edema, no cyanosis, no clubbing Pulses: 2+ symmetric, upper and lower extremities, normal cap refill Neurological: alert, oriented x 3, CN2-12 intact, strength normal upper extremities and lower extremities, sensation normal throughout, DTRs 2+ throughout, no cerebellar signs, gait normal Psychiatric: normal affect, behavior normal, pleasant  WU:[id number] Rectal - deferred   Diabetic Foot Exam - Simple   Simple Foot Form Visual Inspection No deformities, no ulcerations, no other skin breakdown bilaterally: Yes Sensation Testing Intact to touch and monofilament testing bilaterally: Yes Pulse Check Posterior Tibialis and Dorsalis  pulse intact bilaterally: Yes Comments       Assessment and Plan :   Encounter Diagnoses  Name Primary?   Encounter for health maintenance examination in adult Yes   Type 2 diabetes with complication (HCC)    Hypertension associated with diabetes (HCC)    Hyperlipidemia, unspecified hyperlipidemia type    Vaccine refused by patient    Vaccine counseling    Smoker    OSA on CPAP     This visit was a preventative care visit, also known as wellness visit or routine physical.   Topics typically include healthy lifestyle, diet, exercise, preventative care, vaccinations, sick and well care, proper use of emergency dept and after hours care, as well as other concerns.     Separate significant issues discussed: Diabetes -continue glucose monitoring.  Continue Farxiga 10 mg daily.  Continue Ozempic 1 mg weekly.  He is currently on metformin and wants to stop this.  If A1c is still stable  we will discontinue metformin.  HTN -he has had some higher readings of late at home.  Pending labs consider modification of the current regimen.  Currently on Toprol-XL 50 mg daily, enalapril 20 mg daily.  Hyperlipidemia-continue current medication, updated labs today  OSA - continue on CPAP  Tobacco use, abnormal lung sounds-strongly recommend he consider quitting smoking.  I gave the option of baseline chest x-ray and PFT.  He declines today.  Ear pressure-advised of both eardrums appear retracted possibly due to chronic inflammation or other.  Advise daily Flonase or allergy pill at bedtime, consider ENT consult.  He declines today.   General Recommendations: Continue to return yearly for your annual wellness and preventative care visits.  This gives [place] a chance to discuss healthy lifestyle, exercise, vaccinations, review your chart record, and perform screenings where appropriate.  I recommend you see your eye doctor yearly for routine vision care.  I recommend you see your dentist yearly for  routine dental care including hygiene visits twice yearly.   Vaccination  Immunization History  Administered Date(s) Administered   Tdap 01/27/2011, 09/15/2021    Vaccine recommendations: Yearly flu shot Pneumococcal vaccine covid  Vaccines declined   Screening for cancer: Colon cancer screening: Age 35   Testicular cancer screening You should do a monthly self testicular exam if you are between 56-89 years old, and we typically do a testicular exam on the yearly physical for this same age group.   Prostate Cancer screening: The recommended prostate cancer screening test is a blood test called the prostate-specific antigen (PSA) test. PSA is a protein that is made in the prostate. As you age, your prostate naturally produces more PSA. Abnormally high PSA levels may be caused by: Prostate cancer. An enlarged prostate that is not caused by cancer (benign prostatic hyperplasia, or BPH). This condition is very common in older men. A prostate gland infection (prostatitis) or urinary tract infection. Certain medicines such as male hormones (like testosterone) or other medicines that raise testosterone levels. A rectal exam may be done as part of prostate cancer screening to help provide information about the size of your prostate gland. When a rectal exam is performed, it should be done after the PSA level is drawn to avoid any effect on the results.   Skin cancer screening: Check your skin regularly for new changes, growing lesions, or other lesions of concern Come in for evaluation if you have skin lesions of concern.   Lung cancer screening: If you have a greater than 20 pack year history of tobacco use, then you may qualify for lung cancer screening with a chest CT scan.   Please call your insurance company to inquire about coverage for this test.   Pancreatic cancer:  no current screening test is available or routinely recommended. (risk factors: smoking, overweight or  obese, diabetes, chronic pancreatitis, work exposure - dry cleaning, metal working, 42yo>, M>F, Tree surgeon, family hx/o, hereditary breast, ovarian, melanoma, lynch, peutz-jeghers).  Symptoms: jaundice, dark urine, light color or greasy stools, itchy skin, belly or back pain, weight loss, poor appetite, nausea, vomiting, liver enlargement, DVT/blood clots.   We currently don't have screenings for other cancers besides breast, cervical, colon, and lung cancers.  If you have a strong family history of cancer or have other cancer screening concerns, please let me know.  Genetic testing referral is an option for individuals with high cancer risk in the family.  There are some other cancer screenings in development currently.  Bone health: Get at least 150 minutes of aerobic exercise weekly Get weight bearing exercise at least once weekly Bone density test:  A bone density test is an imaging test that uses a type of X-ray to measure the amount of calcium and other minerals in your bones. The test may be used to diagnose or screen you for a condition that causes weak or thin bones (osteoporosis), predict your risk for a broken bone (fracture), or determine how well your osteoporosis treatment is working. The bone density test is recommended for females 65 and older, or females or males <65 if certain risk factors such as thyroid disease, long term use of steroids such as for asthma or rheumatological issues, vitamin D deficiency, estrogen deficiency, family history of osteoporosis, self or family history of fragility fracture in first degree relative.    Heart health: Get at least 150 minutes of aerobic exercise weekly Limit alcohol It is important to maintain a healthy blood pressure and healthy cholesterol numbers  Heart disease screening: Screening for heart disease includes screening for blood pressure, fasting lipids, glucose/diabetes screening, BMI height to weight ratio, reviewed of  smoking status, physical activity, and diet.    Goals include blood pressure 120/80 or less, maintaining a healthy lipid/cholesterol profile, preventing diabetes or keeping diabetes numbers under good control, not smoking or using tobacco products, exercising most days per week or at least 150 minutes per week of exercise, and eating healthy variety of fruits and vegetables, healthy oils, and avoiding unhealthy food choices like fried food, fast food, high sugar and high cholesterol foods.    Other tests may possibly include EKG test, CT coronary calcium score, echocardiogram, exercise treadmill stress test.     Consider CT coronary scan, $95 cash pay   Vascular disease screening: For higher risk individuals including smokers, diabetics, patients with known heart disease or high blood pressure, kidney disease, and others, screening for vascular disease or atherosclerosis of the arteries is available.  Examples may include carotid ultrasound, abdominal aortic ultrasound, ABI blood flow screening in the legs, thoracic aorta screening.  Consider ABI screening    Medical care options: I recommend you continue to seek care here first for routine care.  We try really hard to have available appointments Monday through Friday daytime hours for sick visits, acute visits, and physicals.  Urgent care should be used for after hours and weekends for significant issues that cannot wait till the next day.  The emergency department should be used for significant potentially life-threatening emergencies.  The emergency department is expensive, can often have long wait times for less significant concerns, so try to utilize primary care, urgent care, or telemedicine when possible to avoid unnecessary trips to the emergency department.  Virtual visits and telemedicine have been introduced since the pandemic started in 2020, and can be convenient ways to receive medical care.  We offer virtual appointments as well to  assist you in a variety of options to seek medical care.   Legal  Take the time to do a last will and testament, Advanced Directives including Health Care Power of Attorney and Living Will documents.  Don't leave your family with burdens that can be handled ahead of time.   Advanced Directives: I recommend you consider completing a Health Care Power of Attorney and Living Will.   These documents respect your wishes and help alleviate burdens on your loved ones if you were to become terminally ill or be in a position to need those  documents enforced.    You can complete Advanced Directives yourself, have them notarized, then have copies made for our office, for you and for anybody you feel should have them in safe keeping.  Or, you can have an attorney prepare these documents.   If you haven't updated your Last Will and Testament in a while, it may be worthwhile having an attorney prepare these documents together and save on some costs.       Spiritual and Emotional Health Keeping a healthy spiritual life can help you better manage your physical health. Your spiritual life can help you to cope with any issues that may arise with your physical health.  Balance can keep [place] healthy and help [place] to recover.  If you are struggling with your spiritual health there are questions that you may want to ask yourself:  What makes me feel most complete? When do I feel most connected to the rest of the world? Where do I find the most inner strength? What am I doing when I feel whole?  Helpful tips: Being in nature. Some people feel very connected and at peace when they are walking outdoors or are outside. Helping others. Some feel the largest sense of wellbeing when they are of service to others. Being of service can take on many forms. It can be doing volunteer work, being kind to strangers, or offering a hand to a friend in need. Gratitude. Some people find they feel the most connected when they remain  grateful. They may make lists of all the things they are grateful for or say a thank you out loud for all they have.    Emotional Health Are you in tune with your emotional health?  Check out this link: http://www.marquez-love.com/    Financial Health Make sure you use a budget for your personal finances Make sure you are insured against risks (health insurance, life insurance, auto insurance, etc) Save more, spend less Set financial goals If you need help in this area, good resources include counseling through [employer] or other community resources, have a meeting with a Social research officer, government, and a good resource is the [employer] "Andre [name]" was seen today for annual exam.  Diagnoses and all orders for this visit:  Encounter for health maintenance examination in adult -     Comprehensive metabolic panel -     CBC with Differential/Platelet -     Lipid panel -     Hemoglobin A1c -     Microalbumin/Creatinine Ratio, Urine -     TSH -     POCT Urinalysis DIP (Proadvantage Device) -     Ambulatory referral to Ophthalmology  Type 2 diabetes with complication (HCC) -     Hemoglobin A1c -     Microalbumin/Creatinine Ratio, Urine -     Ambulatory referral to Ophthalmology  Hypertension associated with diabetes (HCC) -     Microalbumin/Creatinine Ratio, Urine -     Ambulatory referral to Ophthalmology  Hyperlipidemia, unspecified hyperlipidemia type -     Lipid panel  Vaccine refused by patient  Vaccine counseling  Smoker  OSA on CPAP     Follow-up pending labs, yearly for physical

## 2023-09-13 NOTE — Telephone Encounter (Signed)
Pt has an appt today.

## 2023-09-13 NOTE — Patient Instructions (Signed)
This visit was a preventative care visit, also known as wellness visit or routine physical.   Topics typically include healthy lifestyle, diet, exercise, preventative care, vaccinations, sick and well care, proper use of emergency dept and after hours care, as well as other concerns.     Separate significant issues discussed: Diabetes -continue glucose monitoring.  Continue Farxiga 10 mg daily.  Continue Ozempic 1 mg weekly.  He is currently on metformin and wants to stop this.  If A1c is still stable we will discontinue metformin.  HTN -he has had some higher readings of late at home.  Pending labs consider modification of the current regimen.  Currently on Toprol-XL 50 mg daily, enalapril 20 mg daily.  Hyperlipidemia-continue current medication, updated labs today  OSA - continue on CPAP  Tobacco use, abnormal lung sounds-strongly recommend he consider quitting smoking.  I gave the option of baseline chest x-ray and PFT.  He declines today.  Ear pressure-advised of both eardrums appear retracted possibly due to chronic inflammation or other.  Advise daily Flonase or allergy pill at bedtime, consider ENT consult.  He declines today.   General Recommendations: Continue to return yearly for your annual wellness and preventative care visits.  This gives [place] a chance to discuss healthy lifestyle, exercise, vaccinations, review your chart record, and perform screenings where appropriate.  I recommend you see your eye doctor yearly for routine vision care.  I recommend you see your dentist yearly for routine dental care including hygiene visits twice yearly.   Vaccination  Immunization History  Administered Date(s) Administered   Tdap 01/27/2011, 09/15/2021    Vaccine recommendations: Yearly flu shot Pneumococcal vaccine covid  Vaccines declined   Screening for cancer: Colon cancer screening: Age 5   Testicular cancer screening You should do a monthly self testicular exam if  you are between 81-56 years old, and we typically do a testicular exam on the yearly physical for this same age group.   Prostate Cancer screening: The recommended prostate cancer screening test is a blood test called the prostate-specific antigen (PSA) test. PSA is a protein that is made in the prostate. As you age, your prostate naturally produces more PSA. Abnormally high PSA levels may be caused by: Prostate cancer. An enlarged prostate that is not caused by cancer (benign prostatic hyperplasia, or BPH). This condition is very common in older men. A prostate gland infection (prostatitis) or urinary tract infection. Certain medicines such as male hormones (like testosterone) or other medicines that raise testosterone levels. A rectal exam may be done as part of prostate cancer screening to help provide information about the size of your prostate gland. When a rectal exam is performed, it should be done after the PSA level is drawn to avoid any effect on the results.   Skin cancer screening: Check your skin regularly for new changes, growing lesions, or other lesions of concern Come in for evaluation if you have skin lesions of concern.   Lung cancer screening: If you have a greater than 20 pack year history of tobacco use, then you may qualify for lung cancer screening with a chest CT scan.   Please call your insurance company to inquire about coverage for this test.   Pancreatic cancer:  no current screening test is available or routinely recommended. (risk factors: smoking, overweight or obese, diabetes, chronic pancreatitis, work exposure - dry cleaning, metal working, 42yo>, M>F, Tree surgeon, family hx/o, hereditary breast, ovarian, melanoma, lynch, peutz-jeghers).  Symptoms: jaundice, dark urine, light  color or greasy stools, itchy skin, belly or back pain, weight loss, poor appetite, nausea, vomiting, liver enlargement, DVT/blood clots.   We currently don't have screenings for  other cancers besides breast, cervical, colon, and lung cancers.  If you have a strong family history of cancer or have other cancer screening concerns, please let me know.  Genetic testing referral is an option for individuals with high cancer risk in the family.  There are some other cancer screenings in development currently.   Bone health: Get at least 150 minutes of aerobic exercise weekly Get weight bearing exercise at least once weekly Bone density test:  A bone density test is an imaging test that uses a type of X-ray to measure the amount of calcium and other minerals in your bones. The test may be used to diagnose or screen you for a condition that causes weak or thin bones (osteoporosis), predict your risk for a broken bone (fracture), or determine how well your osteoporosis treatment is working. The bone density test is recommended for females 65 and older, or females or males <65 if certain risk factors such as thyroid disease, long term use of steroids such as for asthma or rheumatological issues, vitamin D deficiency, estrogen deficiency, family history of osteoporosis, self or family history of fragility fracture in first degree relative.    Heart health: Get at least 150 minutes of aerobic exercise weekly Limit alcohol It is important to maintain a healthy blood pressure and healthy cholesterol numbers  Heart disease screening: Screening for heart disease includes screening for blood pressure, fasting lipids, glucose/diabetes screening, BMI height to weight ratio, reviewed of smoking status, physical activity, and diet.    Goals include blood pressure 120/80 or less, maintaining a healthy lipid/cholesterol profile, preventing diabetes or keeping diabetes numbers under good control, not smoking or using tobacco products, exercising most days per week or at least 150 minutes per week of exercise, and eating healthy variety of fruits and vegetables, healthy oils, and avoiding  unhealthy food choices like fried food, fast food, high sugar and high cholesterol foods.    Other tests may possibly include EKG test, CT coronary calcium score, echocardiogram, exercise treadmill stress test.     Consider CT coronary scan, $95 cash pay   Vascular disease screening: For higher risk individuals including smokers, diabetics, patients with known heart disease or high blood pressure, kidney disease, and others, screening for vascular disease or atherosclerosis of the arteries is available.  Examples may include carotid ultrasound, abdominal aortic ultrasound, ABI blood flow screening in the legs, thoracic aorta screening.  Consider ABI screening    Medical care options: I recommend you continue to seek care here first for routine care.  We try really hard to have available appointments Monday through Friday daytime hours for sick visits, acute visits, and physicals.  Urgent care should be used for after hours and weekends for significant issues that cannot wait till the next day.  The emergency department should be used for significant potentially life-threatening emergencies.  The emergency department is expensive, can often have long wait times for less significant concerns, so try to utilize primary care, urgent care, or telemedicine when possible to avoid unnecessary trips to the emergency department.  Virtual visits and telemedicine have been introduced since the pandemic started in 2020, and can be convenient ways to receive medical care.  We offer virtual appointments as well to assist you in a variety of options to seek medical care.   Legal  Take the time to do a last will and testament, Advanced Directives including Health Care Power of Attorney and Living Will documents.  Don't leave your family with burdens that can be handled ahead of time.   Advanced Directives: I recommend you consider completing a Health Care Power of Attorney and Living Will.   These documents  respect your wishes and help alleviate burdens on your loved ones if you were to become terminally ill or be in a position to need those documents enforced.    You can complete Advanced Directives yourself, have them notarized, then have copies made for our office, for you and for anybody you feel should have them in safe keeping.  Or, you can have an attorney prepare these documents.   If you haven't updated your Last Will and Testament in a while, it may be worthwhile having an attorney prepare these documents together and save on some costs.       Spiritual and Emotional Health Keeping a healthy spiritual life can help you better manage your physical health. Your spiritual life can help you to cope with any issues that may arise with your physical health.  Balance can keep [place] healthy and help [place] to recover.  If you are struggling with your spiritual health there are questions that you may want to ask yourself:  What makes me feel most complete? When do I feel most connected to the rest of the world? Where do I find the most inner strength? What am I doing when I feel whole?  Helpful tips: Being in nature. Some people feel very connected and at peace when they are walking outdoors or are outside. Helping others. Some feel the largest sense of wellbeing when they are of service to others. Being of service can take on many forms. It can be doing volunteer work, being kind to strangers, or offering a hand to a friend in need. Gratitude. Some people find they feel the most connected when they remain grateful. They may make lists of all the things they are grateful for or say a thank you out loud for all they have.    Emotional Health Are you in tune with your emotional health?  Check out this link: http://www.marquez-love.com/    Financial Health Make sure you use a budget for your personal finances Make sure you are insured against risks (health insurance, life insurance, auto  insurance, etc) Save more, spend less Set financial goals If you need help in this area, good resources include counseling through [employer] or other community resources, have a meeting with a Social research officer, government, and a good resource is the [employer]

## 2023-09-14 ENCOUNTER — Other Ambulatory Visit: Payer: Self-pay | Admitting: Medical

## 2023-09-14 LAB — CBC WITH DIFFERENTIAL/PLATELET
Basophils Absolute: 0 10*3/uL (ref 0.0–0.2)
Basos: 0 %
EOS (ABSOLUTE): 0.1 10*3/uL (ref 0.0–0.4)
Eos: 2 %
Hematocrit: 53 % — ABNORMAL HIGH (ref 37.5–51.0)
Hemoglobin: 17.8 g/dL — ABNORMAL HIGH (ref 13.0–17.7)
Immature Grans (Abs): 0 10*3/uL (ref 0.0–0.1)
Immature Granulocytes: 0 %
Lymphocytes Absolute: 1.3 10*3/uL (ref 0.7–3.1)
Lymphs: 17 %
MCH: 32.4 pg (ref 26.6–33.0)
MCHC: 33.6 g/dL (ref 31.5–35.7)
MCV: 96 fL (ref 79–97)
Monocytes Absolute: 0.6 10*3/uL (ref 0.1–0.9)
Monocytes: 8 %
Neutrophils Absolute: 5.5 10*3/uL (ref 1.4–7.0)
Neutrophils: 73 %
Platelets: 218 10*3/uL (ref 150–450)
RBC: 5.5 x10E6/uL (ref 4.14–5.80)
RDW: 11.2 % — ABNORMAL LOW (ref 11.6–15.4)
WBC: 7.6 10*3/uL (ref 3.4–10.8)

## 2023-09-14 LAB — LIPID PANEL
Chol/HDL Ratio: 2.7 ratio (ref 0.0–5.0)
Cholesterol, Total: 100 mg/dL (ref 100–199)
HDL: 37 mg/dL — ABNORMAL LOW (ref >39)
LDL Chol Calc (NIH): 48 mg/dL (ref 0–99)
Triglycerides: 70 mg/dL (ref 0–149)
VLDL Cholesterol Cal: 15 mg/dL (ref 5–40)

## 2023-09-14 LAB — HEMOGLOBIN A1C
Est. average glucose Bld gHb Est-mCnc: 131 mg/dL
Hgb A1c MFr Bld: 6.2 % — ABNORMAL HIGH (ref 4.8–5.6)

## 2023-09-14 LAB — COMPREHENSIVE METABOLIC PANEL
ALT: 24 IU/L (ref 0–44)
AST: 19 IU/L (ref 0–40)
Albumin: 4.3 g/dL (ref 4.1–5.1)
Alkaline Phosphatase: 85 IU/L (ref 44–121)
BUN/Creatinine Ratio: 11 (ref 9–20)
BUN: 11 mg/dL (ref 6–24)
Bilirubin Total: 0.7 mg/dL (ref 0.0–1.2)
CO2: 22 mmol/L (ref 20–29)
Calcium: 9.5 mg/dL (ref 8.7–10.2)
Chloride: 102 mmol/L (ref 96–106)
Creatinine, Ser: 1.02 mg/dL (ref 0.76–1.27)
Globulin, Total: 2.1 g/dL (ref 1.5–4.5)
Glucose: 97 mg/dL (ref 70–99)
Potassium: 4.3 mmol/L (ref 3.5–5.2)
Sodium: 140 mmol/L (ref 134–144)
Total Protein: 6.4 g/dL (ref 6.0–8.5)
eGFR: 94 mL/min/{1.73_m2} (ref >59)

## 2023-09-14 LAB — MICROALBUMIN / CREATININE URINE RATIO
Creatinine, Urine: 132.8 mg/dL
Microalb/Creat Ratio: 9 mg/g creat (ref 0–29)
Microalbumin, Urine: 11.7 ug/mL

## 2023-09-14 LAB — TSH: TSH: 0.956 u[IU]/mL (ref 0.450–4.500)

## 2023-09-14 MED ORDER — METOPROLOL SUCCINATE ER 50 MG PO TB24: ORAL_TABLET | ORAL | 3 refills | Status: DC

## 2023-09-14 MED ORDER — ROSUVASTATIN CALCIUM 20 MG PO TABS: 20.0000 mg | ORAL_TABLET | Freq: Every day | ORAL | 3 refills | Status: DC

## 2023-09-14 MED ORDER — METOPROLOL SUCCINATE ER 100 MG PO TB24: 100.0000 mg | ORAL_TABLET | Freq: Every day | ORAL | 1 refills | Status: DC

## 2023-09-14 MED ORDER — ENALAPRIL MALEATE 20 MG PO TABS: 20.0000 mg | ORAL_TABLET | Freq: Every day | ORAL | 3 refills | Status: DC

## 2023-09-14 MED ORDER — DAPAGLIFLOZIN PROPANEDIOL 10 MG PO TABS: 10.0000 mg | ORAL_TABLET | Freq: Every day | ORAL | 3 refills | Status: DC

## 2023-09-14 MED ORDER — OZEMPIC (1 MG/DOSE) 4 MG/3ML ~~LOC~~ SOPN: PEN_INJECTOR | SUBCUTANEOUS | 5 refills | Status: DC

## 2023-09-14 NOTE — Progress Notes (Signed)
Cholesterol is good except for HDL chronically low.  Diabetes marker 6.2% stable.  Liver kidney electrolytes normal.  Microalbumin kidney marker still pending.  Thyroid normal.  Your hemoglobin is elevated most likely due to tobacco use.  Go ahead and stop metformin.  Continue rest of medicines as usual.  If you want to increase Ozempic to 2 mg that is an option for additional weight loss.  Otherwise we can leave things where they are.  Let me know if agreeable to the ABI blood flow screening and a CT coronary heart test.  Follow-up in 4 to 6 months

## 2023-09-14 NOTE — Progress Notes (Signed)
I increased the Toprol to 100 mg daily.  Have him not pick up the refill on the 50 mg and instead change to 100 mg daily.  Continue Enalapril 20mg  daily

## 2023-09-18 ENCOUNTER — Telehealth: Payer: Self-pay

## 2023-09-18 NOTE — Telephone Encounter (Signed)
Third party rejection faxed in for farxiga 10mg  #30

## 2023-09-18 NOTE — Telephone Encounter (Signed)
Pt picked up samples

## 2023-09-20 ENCOUNTER — Other Ambulatory Visit: Payer: Self-pay | Admitting: Medical

## 2023-09-20 NOTE — Telephone Encounter (Signed)
Pt was advised to stop metformin

## 2023-11-10 ENCOUNTER — Telehealth: Payer: Self-pay

## 2023-11-10 NOTE — Telephone Encounter (Signed)
Key: [facility] PA Case ID #: [id number] Status: sent iconSent to Plan today Drug: Farxiga 10MG  tablets Form: [facility] Electronic Prior Authorization Form (2017 NCPDP)

## 2023-11-30 NOTE — Telephone Encounter (Signed)
Key: [facility] PA Case ID #: [id number] Outcome: Denied on November 15 by [employer] 2017 NCPDP Request Reference Number: [id number].   FARXIGA TAB 10MG  is denied for not meeting the prior authorization requirement(s). Drug: Marcelline Deist 10MG  tablets Form: [employer] Electronic Prior Authorization Form (2017 NCPDP)  The requested medication is not covered because it is not on the listing or formulary of approved drugs for your plan benefit.  The request for coverage for FARXIGA TAB 10MG , use as directed (30 per month), is denied. This decision is based on health plan criteria for FARXIGA TAB 10MG . This medicine is covered only if: - You have a history of therapeutic failure after a three month trial, contraindication or intolerance to [place].

## 2023-12-01 NOTE — Telephone Encounter (Signed)
Pt has been getting samples of this when we have them.

## 2024-02-21 ENCOUNTER — Telehealth: Payer: Self-pay | Admitting: Internal Medicine

## 2024-02-21 NOTE — Telephone Encounter (Signed)
 Copied from CRM [id number]. Topic: General - Other >> Feb 21, 2024  2:30 PM Albin Felling L wrote: Reason for CRM: Pt requesting to speak [place]. Pt requesting Willine Schwalbe check his CPAP usage.   Requesting callback, [phone]

## 2024-02-21 NOTE — Telephone Encounter (Signed)
 I printed it his cpap usage

## 2024-03-25 ENCOUNTER — Ambulatory Visit: Admitting: Medical

## 2024-03-25 VITALS — BP 138/90 | HR 84 | Wt 272.6 lb

## 2024-03-25 DIAGNOSIS — G4733 Obstructive sleep apnea (adult) (pediatric): Secondary | ICD-10-CM | POA: Diagnosis not present

## 2024-03-25 DIAGNOSIS — I152 Hypertension secondary to endocrine disorders: Secondary | ICD-10-CM | POA: Diagnosis not present

## 2024-03-25 DIAGNOSIS — E118 Type 2 diabetes mellitus with unspecified complications: Secondary | ICD-10-CM | POA: Diagnosis not present

## 2024-03-25 DIAGNOSIS — E1159 Type 2 diabetes mellitus with other circulatory complications: Secondary | ICD-10-CM

## 2024-03-25 LAB — POCT URINALYSIS DIP (PROADVANTAGE DEVICE)
Bilirubin, UA: NEGATIVE
Blood, UA: NEGATIVE
Glucose, UA: >=1000 mg/dL — AB
Ketones, POC UA: NEGATIVE mg/dL
Leukocytes, UA: NEGATIVE
Nitrite, UA: NEGATIVE
Protein Ur, POC: NEGATIVE mg/dL
Specific Gravity, Urine: 1.01
Urobilinogen, Ur: NEGATIVE
pH, UA: 6 (ref 5.0–8.0)

## 2024-03-25 LAB — POCT GLYCOSYLATED HEMOGLOBIN (HGB A1C): Hemoglobin A1C: 7 % — AB (ref 4.0–5.6)

## 2024-03-25 MED ORDER — VITAMIN B-12 1000 MCG PO TABS
1000.0000 ug | ORAL_TABLET | Freq: Every day | ORAL | 0 refills | Status: DC
Start: 2024-03-25 — End: 2024-07-05

## 2024-03-25 MED ORDER — METOPROLOL SUCCINATE ER 100 MG PO TB24: 100.0000 mg | ORAL_TABLET | Freq: Every day | ORAL | 2 refills | Status: DC

## 2024-03-25 MED ORDER — EDARBYCLOR 40-25 MG PO TABS: 1.0000 | ORAL_TABLET | Freq: Every day | ORAL | Status: DC

## 2024-03-25 MED ORDER — AZILSARTAN-CHLORTHALIDONE 40-12.5 MG PO TABS: 1.0000 | ORAL_TABLET | Freq: Every day | ORAL | 2 refills | Status: DC

## 2024-03-25 MED ORDER — SEMAGLUTIDE (2 MG/DOSE) 8 MG/3ML ~~LOC~~ SOPN: 2.0000 mg | PEN_INJECTOR | SUBCUTANEOUS | 2 refills | Status: DC

## 2024-03-25 NOTE — Progress Notes (Signed)
 Subjective:  Andre [name] is a 43 y.o. male who presents for Chief Complaint  Patient presents with   Medical Management of Chronic Issues    Med check- needs A1c and bp     Hypertension-compliant with Toprol-XL 100 mg daily and enalapril.  Lately has had more stress, so blood pressure little bit elevated.  No chest pain, palpitations.  He unfortunately gained weight in recent months  Diabetes-he continues on Ozempic 1 mg weekly and Farxiga without complaint.  No symptoms of concern  OSA-using CPAP most of the time.  He does not sleep all that well at night.  Because of stress he is not sleeping as many hours as he would like  He some pain and burning under his left lateral foot in a specific area.  No other leg or foot pain or burning.  No injury.  No other aggravating or relieving factors.    No other c/o.  Past Medical History:  Diagnosis Date   Bell's palsy    at 6th or 7th grade on right side    Depression    Diabetes mellitus without complication (HCC)    2017   Elevated LFTs 2017   Hypertension    Obesity    OSA (obstructive sleep apnea)    Current Outpatient Medications on File Prior to Visit  Medication Sig Dispense Refill   dapagliflozin propanediol (FARXIGA) 10 MG TABS tablet Take 1 tablet (10 mg total) by mouth daily before breakfast. 90 tablet 3   rosuvastatin (CRESTOR) 20 MG tablet Take 1 tablet (20 mg total) by mouth daily. 90 tablet 3   glucose blood (BAYER CONTOUR NEXT TEST) test strip Test twice a day as needed. Pt uses contour next ez meter 100 each 11   MICROLET LANCETS MISC Test daily 100 each 11   No current facility-administered medications on file prior to visit.     The following portions of the patient's history were reviewed and updated as appropriate: allergies, current medications, past family history, past medical history, past social history, past surgical history and problem list.  ROS Otherwise as in subjective  above    Objective: BP (!) 138/90   Pulse 84   Wt 272 lb 9.6 oz (123.7 kg)   BMI 34.07 kg/m    BP Readings from Last 3 Encounters:  03/25/24 (!) 138/90  09/13/23 138/82  02/01/23 124/64    Wt Readings from Last 3 Encounters:  03/25/24 272 lb 9.6 oz (123.7 kg)  09/13/23 258 lb 9.6 oz (117.3 kg)  02/01/23 282 lb 9.6 oz (128.2 kg)    General appearance: alert, no distress, well developed, well nourished Neck: supple, no lymphadenopathy, no thyromegaly, no masses Heart: RRR, normal S1, S2, no murmurs Lungs: CTA bilaterally, no wheezes, rhonchi, or rales Pulses: 2+ radial pulses, 2+ pedal pulses, normal cap refill Ext: no edema  Diabetic Foot Exam - Simple   Simple Foot Form Diabetic Foot exam was performed with the following findings: Yes 03/25/2024  3:16 PM  Visual Inspection See comments: Yes Sensation Testing Intact to touch and monofilament testing bilaterally: Yes Pulse Check Posterior Tibialis and Dorsalis pulse intact bilaterally: Yes Comments Arches flatten out when standing on the ground, there is a mild callus on the volar left foot laterally under the fourth and fifth MTP region but no other abnormality      Assessment: Encounter Diagnoses  Name Primary?   Type 2 diabetes with complication (HCC) Yes   Hypertension associated with diabetes (HCC)  OSA on CPAP      Plan: Hypertension Your blood pressure is not at goal Continue metoprolol Toprol-XL 100 mg daily Begin Azilsartan chlorthalidone 40/12.5 mg daily.  This will be to replace enalapril assuming insurance covers this medication Let me know right away if insurance does not cover this medicine or if this is too expensive Limit salt, exercise regularly   Diabetes Continue Farxiga 10 mg daily Lets increase to Ozempic 2 mg weekly injection Your hemoglobin A1c was 7.0% today Keep in mind that glucose which open your urine because you are on Farxiga.  This is expected with this  medication   Foot pain Make sure you are wearing a shoe with a good arch support Consider using B12 supplement daily to see if that makes any difference in your symptoms Consider using nighttime plantar fasciitis foot splint to stretch out your good arch Use arch supports Avoid going barefooted when possible  OSA Continue CPAP therapy Continue efforts to lose weight through healthy diet exercise and using the Ozempic medication    Andre "Apolinar Junes" was seen today for medical management of chronic issues.  Diagnoses and all orders for this visit:  Type 2 diabetes with complication (HCC) -     HgB A1c -     POCT Urinalysis DIP (Proadvantage Device)  Hypertension associated with diabetes (HCC)  OSA on CPAP  Other orders -     Azilsartan-Chlorthalidone 40-12.5 MG TABS; Take 1 tablet by mouth daily. -     metoprolol succinate (TOPROL XL) 100 MG 24 hr tablet; Take 1 tablet (100 mg total) by mouth daily. Take with or immediately following a meal. -     Semaglutide, 2 MG/DOSE, 8 MG/3ML SOPN; Inject 2 mg as directed once a week. -     cyanocobalamin (VITAMIN B12) 1000 MCG tablet; Take 1 tablet (1,000 mcg total) by mouth daily. -     Azilsartan-Chlorthalidone (EDARBYCLOR) 40-25 MG TABS; Take 1 tablet by mouth daily.     Follow up: 84mo on bp

## 2024-03-25 NOTE — Patient Instructions (Addendum)
 Hypertension Your blood pressure is not at goal Continue metoprolol Toprol-XL 100 mg daily Begin Azilsartan chlorthalidone 40/12.5 mg daily.  This will be to replace enalapril assuming insurance covers this medication. Take 40/25mg  as a sample for #14 tablets daily and then switch to 40/12.5mg  dose  that was sent to pharmacy Let me know right away if insurance does not cover this medicine or if this is too expensive Limit salt, exercise regularly   BP Readings from Last 3 Encounters:  03/25/24 (!) 138/90  09/13/23 138/82  02/01/23 124/64    Diabetes Continue Farxiga 10 mg daily Lets increase to Ozempic 2 mg weekly injection Your hemoglobin A1c was 7.0% today Keep in mind that glucose which open your urine because you are on Farxiga.  This is expected with this medication   Foot pain Make sure you are wearing a shoe with a good arch support Consider using B12 supplement daily to see if that makes any difference in your symptoms Consider using nighttime plantar fasciitis foot splint to stretch out your good arch Use arch supports Avoid going barefooted when possible  OSA Continue CPAP therapy Continue efforts to lose weight through healthy diet exercise and using the Ozempic medication

## 2024-03-28 ENCOUNTER — Telehealth: Payer: Self-pay

## 2024-03-28 ENCOUNTER — Other Ambulatory Visit (HOSPITAL_COMMUNITY): Payer: Self-pay

## 2024-03-28 NOTE — Telephone Encounter (Signed)
 Pharmacy Patient Advocate Encounter   Received notification from CoverMyMeds that prior authorization for Edarbyclor 40-12.5MG  tablets is required/requested.   Insurance verification completed.   The patient is insured through [facility] .   Per test claim: PA required; PA submitted to above mentioned insurance via CoverMyMeds Key/confirmation #/EOC (Key: [id number])    Status is pending

## 2024-03-29 ENCOUNTER — Other Ambulatory Visit: Payer: Self-pay | Admitting: Medical

## 2024-03-29 MED ORDER — VALSARTAN-HYDROCHLOROTHIAZIDE 160-25 MG PO TABS: 1.0000 | ORAL_TABLET | Freq: Every day | ORAL | 0 refills | Status: DC

## 2024-03-29 NOTE — Telephone Encounter (Signed)
 Pharmacy Patient Advocate Encounter  Received notification from [facility] that Prior Authorization for  Edarbyclor 40-12.5MG  tablets has been DENIED.  Full denial letter will be uploaded to the media tab. See denial reason below.     Pt must trial and fail 3 of the following pref'd drugs   PA #/Case ID/Reference #:  (Key: [id number])

## 2024-04-01 ENCOUNTER — Ambulatory Visit (INDEPENDENT_AMBULATORY_CARE_PROVIDER_SITE_OTHER): Admitting: Medical

## 2024-04-01 ENCOUNTER — Ambulatory Visit: Payer: Self-pay

## 2024-04-01 VITALS — BP 100/70 | HR 105 | Temp 98.4°F | Wt 272.6 lb

## 2024-04-01 DIAGNOSIS — E1159 Type 2 diabetes mellitus with other circulatory complications: Secondary | ICD-10-CM | POA: Diagnosis not present

## 2024-04-01 DIAGNOSIS — E785 Hyperlipidemia, unspecified: Secondary | ICD-10-CM | POA: Diagnosis not present

## 2024-04-01 DIAGNOSIS — R42 Dizziness and giddiness: Secondary | ICD-10-CM | POA: Diagnosis not present

## 2024-04-01 DIAGNOSIS — R531 Weakness: Secondary | ICD-10-CM

## 2024-04-01 DIAGNOSIS — F17200 Nicotine dependence, unspecified, uncomplicated: Secondary | ICD-10-CM

## 2024-04-01 DIAGNOSIS — I152 Hypertension secondary to endocrine disorders: Secondary | ICD-10-CM

## 2024-04-01 DIAGNOSIS — R55 Syncope and collapse: Secondary | ICD-10-CM

## 2024-04-01 DIAGNOSIS — E118 Type 2 diabetes mellitus with unspecified complications: Secondary | ICD-10-CM

## 2024-04-01 DIAGNOSIS — R202 Paresthesia of skin: Secondary | ICD-10-CM

## 2024-04-01 MED ORDER — ENALAPRIL MALEATE 20 MG PO TABS: 20.0000 mg | ORAL_TABLET | Freq: Every day | ORAL | 1 refills | Status: DC

## 2024-04-01 NOTE — Telephone Encounter (Signed)
 Left message for pt to call me back

## 2024-04-01 NOTE — Telephone Encounter (Signed)
 Pt was notified but coming in today

## 2024-04-01 NOTE — Progress Notes (Signed)
 Subjective:  Andre [name] is a 43 y.o. male who presents for Chief Complaint  Patient presents with   dizziness    Dizziness and BP dropped.  Some rapid heartrate. Passed out over the weekend. Stopped BP medication. Felt thristy all weekend and mouth has been really dry. No slurred speech, but face was numb and lips were numb, clammy all weekend, right arm felt weird and numb and tingling. Felt weird all weekend and not his self     Here for recheck from last week.  He was here last week for med check.  At the time he was taking Toprol-XL 100 mg daily and enalapril 20 mg daily.  Because blood pressure at home and here was not quite to goal we changed to samples of Edarbichlor 40/12.5mg  samples.  Over the weekend he had an episode of rapid heartbeat, felt weak and thinks he passed out.  He felt thirsty all weekend mouth severely dry.  No slurred speech.  He did feel tingling in his face.  He felt clammy all weekend, right arm feel little weird.  He continues to take his blood pressure medications in the weekend including his Toprol this morning.  He has not taken the [facility] today  He did not take his blood sugar this weekend when he felt bad.  He does not have a blood pressure cuff at home  No other aggravating or relieving factors.    No other c/o.  Past Medical History:  Diagnosis Date   Bell's palsy    at 6th or 7th grade on right side    Depression    Diabetes mellitus without complication (HCC)    2017   Elevated LFTs 2017   Hypertension    Obesity    OSA (obstructive sleep apnea)    Current Outpatient Medications on File Prior to Visit  Medication Sig Dispense Refill   cyanocobalamin (VITAMIN B12) 1000 MCG tablet Take 1 tablet (1,000 mcg total) by mouth daily. 90 tablet 0   dapagliflozin propanediol (FARXIGA) 10 MG TABS tablet Take 1 tablet (10 mg total) by mouth daily before breakfast. 90 tablet 3   glucose blood (BAYER CONTOUR NEXT TEST) test strip Test twice a  day as needed. Pt uses contour next ez meter 100 each 11   metoprolol succinate (TOPROL XL) 100 MG 24 hr tablet Take 1 tablet (100 mg total) by mouth daily. Take with or immediately following a meal. 90 tablet 2   rosuvastatin (CRESTOR) 20 MG tablet Take 1 tablet (20 mg total) by mouth daily. 90 tablet 3   Semaglutide, 2 MG/DOSE, 8 MG/3ML SOPN Inject 2 mg as directed once a week. 3 mL 2   MICROLET LANCETS MISC Test daily 100 each 11   No current facility-administered medications on file prior to visit.     The following portions of the patient's history were reviewed and updated as appropriate: allergies, current medications, past family history, past medical history, past social history, past surgical history and problem list.  ROS Otherwise as in subjective above     Objective: BP 100/70 (Patient Position: Standing)   Pulse (!) 105   Temp 98.4 F (36.9 C)   Wt 272 lb 9.6 oz (123.7 kg)   SpO2 96%   BMI 34.07 kg/m   BP Readings from Last 3 Encounters:  04/01/24 100/70  03/25/24 (!) 138/90  09/13/23 138/82   General appearance: alert, no distress, well developed, well nourished Oral cavity: no lesions Neck: supple, no lymphadenopathy,  no thyromegaly, no masses, no bruits Heart: RRR, normal S1, S2, no murmurs Lungs: CTA bilaterally, no wheezes, rhonchi, or rales Abdomen: +bs, soft, non tender, non distended, no masses, no hepatomegaly, no splenomegaly, no bruits Pulses: 2+ radial pulses, 2+ pedal pulses, normal cap refill Ext: no edema CN2-12 intact, feels a little lightheaded sitting up, otherwise nonfocal exam    Assessment: Encounter Diagnoses  Name Primary?   Hypertension associated with diabetes (HCC) Yes   Type 2 diabetes with complication (HCC)    Hyperlipidemia, unspecified hyperlipidemia type    Weak    Dizziness    Paresthesia    Postural dizziness with presyncope      Plan: Weak, dizziness, presyncope Symptoms likely due to adverse effect of blood  pressure medication. EKG reviewed, labs as below   High blood pressure Last week you were taking Toprol-XL 100 mg daily plus enalapril 20 mg daily.  Because of still not quite the goal we discontinued enalapril 20 and changed to [place] chlor 40/12.5 mg and continued Toprol. However you developed lightheaded and syncopal type event over the weekend You were orthostatic today despite drinking typically 100+ ounces of water daily Unfortunately he took a Toprol this morning contributing to the hypotension  Recommendations: Do not take any blood pressure medicine the rest of the day or tomorrow Monitor your blood pressures at home.  If your blood pressure is at least 130/80 in the next few days then resume back on medication Drop back down on the Toprol to 50 mg XL daily.  You currently have Toprol-XL 100 mg dose.  You can cut this in half and use 50 mg XL daily.  Lets just resume back on the enalapril 20 you had before. So after a few days when the blood pressure is back up to where it should be such as 130/80 then resume Toprol-XL but at the 50 mg or half tablet dose daily and restart your enalapril 20 mg daily. Do not take any of the Edarbi chlor samples Do not pick up the valsartan HCT medication that was sent to the pharmacy a few days ago since the [place] was not to be covered by insurance Recheck in 3-4 week   He continues to smoke, advised cessation.   He is using CPAP some but doesn't always sleep a full 7-8 hours   Since losing weight on Semiglutide, he may need lower doses of antihypertensives.  We unfortunately don't have home readings to compare.  Advised home BP readings.   I recommended cardiology consult but he declines referral today  [name] was seen today for dizziness.  Diagnoses and all orders for this visit:  Hypertension associated with diabetes (HCC) -     CBC with Differential/Platelet -     Comprehensive metabolic panel with GFR -     EKG  12-Lead  Type 2 diabetes with complication (HCC) -     CBC with Differential/Platelet -     Comprehensive metabolic panel with GFR  Hyperlipidemia, unspecified hyperlipidemia type -     CBC with Differential/Platelet -     Comprehensive metabolic panel with GFR  Weak -     CBC with Differential/Platelet -     Comprehensive metabolic panel with GFR -     EKG 12-Lead  Dizziness -     CBC with Differential/Platelet -     Comprehensive metabolic panel with GFR -     EKG 12-Lead  Paresthesia  Postural dizziness with presyncope  Other orders -  enalapril (VASOTEC) 20 MG tablet; Take 1 tablet (20 mg total) by mouth daily.    Follow up: pending labs

## 2024-04-01 NOTE — Telephone Encounter (Signed)
 Copied from CRM [id number]. Topic: Clinical - Red Word Triage >> Apr 01, 2024  8:02 AM Elizebeth Brooking wrote: [facility] that prompted transfer to Nurse Triage: Patient called in stating he is having side effects with the new medication that he was put on, making him very dizziness and not feeling like himself  Chief Complaint: dizziness Symptoms: lightheaded, dizzy, passed out x1 yesterday BP 105/83,  130/83 Frequency: x 4 to 5 days Pertinent Negatives: Patient denies fever Disposition: [] ED /[] Urgent Care (no appt availability in office) / [] Appointment(In office/virtual)/ []  [facility]/ [] Home Care/ [] Refused Recommended Disposition /[] [facility]/ []  Follow-up with PCP Additional Notes: pt stated ever since started on new BP medication he has not felt normal: c/o dizzy, x1 passed out yesterday, acting out /short tempterd, clammy skin.  Pt states BP has been all over the place and x1 BS low.  Pt stated he is no longer taking the BP medication.  Reason for Disposition  [1] MODERATE dizziness (e.g., interferes with normal activities) AND [2] has NOT been evaluated by doctor (or NP/PA) for this  (Exception: Dizziness caused by heat exposure, sudden standing, or poor fluid intake.)  Answer Assessment - Initial Assessment Questions 1. DESCRIPTION: "Describe your dizziness."     Dizziness and lightheaded 2. LIGHTHEADED: "Do you feel lightheaded?" (e.g., somewhat faint, woozy, weak upon standing)     Weak upon standing 3. VERTIGO: "Do you feel like either you or the room is spinning or tilting?" (i.e. vertigo)     Spinning and titling 4. SEVERITY: "How bad is it?"  "Do you feel like you are going to faint?" "Can you stand and walk?"   - MILD: Feels slightly dizzy, but walking normally.   - MODERATE: Feels unsteady when walking, but not falling; interferes with normal activities (e.g., school, work).   - SEVERE: Unable to walk without falling, or requires assistance to walk without  falling; feels like passing out now.      Severe at times 5. ONSET:  "When did the dizziness begin?"     X 4 to 5 days 6. AGGRAVATING FACTORS: "Does anything make it worse?" (e.g., standing, change in head position)     BP medications 7. HEART RATE: "Can you tell me your heart rate?" "How many beats in 15 seconds?"  (Note: not all patients can do this)       N/a 8. CAUSE: "What do you think is causing the dizziness?"     BP medication 9. RECURRENT SYMPTOM: "Have you had dizziness before?" If Yes, ask: "When was the last time?" "What happened that time?"     no 10. OTHER SYMPTOMS: "Do you have any other symptoms?" (e.g., fever, chest pain, vomiting, diarrhea, bleeding)       Hot flashes, clammy each day, dizzy, short tempered, mody 11. PREGNANCY: "Is there any chance you are pregnant?" "When was your last menstrual period?"       N/a  Protocols used: Dizziness - Lightheadedness-A-AH

## 2024-04-01 NOTE — Telephone Encounter (Signed)
 Pt coming in today

## 2024-04-02 ENCOUNTER — Other Ambulatory Visit: Payer: Self-pay | Admitting: Medical

## 2024-04-02 LAB — CBC WITH DIFFERENTIAL/PLATELET
Basophils Absolute: 0 10*3/uL (ref 0.0–0.2)
Basos: 0 %
EOS (ABSOLUTE): 0.1 10*3/uL (ref 0.0–0.4)
Eos: 1 %
Hematocrit: 53.9 % — ABNORMAL HIGH (ref 37.5–51.0)
Hemoglobin: 18.4 g/dL — ABNORMAL HIGH (ref 13.0–17.7)
Immature Grans (Abs): 0 10*3/uL (ref 0.0–0.1)
Immature Granulocytes: 0 %
Lymphocytes Absolute: 1.7 10*3/uL (ref 0.7–3.1)
Lymphs: 23 %
MCH: 32.5 pg (ref 26.6–33.0)
MCHC: 34.1 g/dL (ref 31.5–35.7)
MCV: 95 fL (ref 79–97)
Monocytes Absolute: 0.6 10*3/uL (ref 0.1–0.9)
Monocytes: 8 %
Neutrophils Absolute: 5 10*3/uL (ref 1.4–7.0)
Neutrophils: 68 %
Platelets: 224 10*3/uL (ref 150–450)
RBC: 5.67 x10E6/uL (ref 4.14–5.80)
RDW: 11.1 % — ABNORMAL LOW (ref 11.6–15.4)
WBC: 7.4 10*3/uL (ref 3.4–10.8)

## 2024-04-02 LAB — COMPREHENSIVE METABOLIC PANEL WITH GFR
ALT: 27 IU/L (ref 0–44)
AST: 21 IU/L (ref 0–40)
Albumin: 4.4 g/dL (ref 4.1–5.1)
Alkaline Phosphatase: 101 IU/L (ref 44–121)
BUN/Creatinine Ratio: 14 (ref 9–20)
BUN: 17 mg/dL (ref 6–24)
Bilirubin Total: 0.7 mg/dL (ref 0.0–1.2)
CO2: 22 mmol/L (ref 20–29)
Calcium: 9.8 mg/dL (ref 8.7–10.2)
Chloride: 97 mmol/L (ref 96–106)
Creatinine, Ser: 1.21 mg/dL (ref 0.76–1.27)
Globulin, Total: 2.2 g/dL (ref 1.5–4.5)
Glucose: 285 mg/dL — ABNORMAL HIGH (ref 70–99)
Potassium: 3.8 mmol/L (ref 3.5–5.2)
Sodium: 138 mmol/L (ref 134–144)
Total Protein: 6.6 g/dL (ref 6.0–8.5)
eGFR: 76 mL/min/{1.73_m2} (ref >59)

## 2024-04-02 MED ORDER — BUPROPION HCL ER (XL) 150 MG PO TB24: 150.0000 mg | ORAL_TABLET | ORAL | 0 refills | Status: DC

## 2024-04-02 MED ORDER — METFORMIN HCL 500 MG PO TABS: 500.0000 mg | ORAL_TABLET | Freq: Every day | ORAL | 1 refills | Status: DC

## 2024-04-02 NOTE — Progress Notes (Addendum)
 Your hemoglobin is elevated higher than in the past and your blood sugar was quite elevated at 285.  Other labs are fine  Just to confirm, you are taking your semaglutide Ozempic every single week and your Marcelline Deist every day?  How often are you checking your sugars?  How often do you see numbers greater than 130 either fasting in the morning or before meal?  I was a little shocked to see the high sugar reading given your recent hemoglobin A1c of 7%  Are there things you can do in your diet to have less fluctuations in blood sugars?  For example are you drinking sugary drinks, eating sweets and junk foods or large portions of bread other rice or pasta?  Make the changes we discussed yesterday such as not taking any blood pressure pill for the next day or 2 until your blood pressure is back up to at least normal such as 120/70 or higher.  Once your blood pressure is back up at least to normal or higher then resume half a dose of the Toprol-XL 100 mg or 1/2 tablet daily.  And go back to the enalapril you were on prior  Regarding the elevated hemoglobin, this does put you at risk for thicker blood or blood clots.  I recommend you stop smoking.  Given that this is the higher number we have seen with your hemoglobin I believe it is time to do a consult with hematology.  If agreeable I will refer you to hematology for consult on why your hemoglobin is elevated.  Presumably it is due to tobacco but it could be coming from other issues too.  I want you to call back in 2 weeks with both blood pressure and fasting blood sugar readings

## 2024-04-04 ENCOUNTER — Other Ambulatory Visit: Payer: Self-pay | Admitting: Internal Medicine

## 2024-04-04 DIAGNOSIS — E118 Type 2 diabetes mellitus with unspecified complications: Secondary | ICD-10-CM

## 2024-04-04 MED ORDER — LANCET DEVICE MISC: 0 refills | Status: AC

## 2024-04-04 MED ORDER — LANCETS MISC. MISC
0 refills | Status: AC
Start: 2024-04-04 — End: ?

## 2024-04-04 MED ORDER — BLOOD GLUCOSE MONITORING SUPPL DEVI: 0 refills | Status: DC

## 2024-04-04 MED ORDER — BLOOD GLUCOSE TEST VI STRP: ORAL_STRIP | 0 refills | Status: AC

## 2024-04-09 ENCOUNTER — Telehealth: Payer: Self-pay | Admitting: Internal Medicine

## 2024-04-09 MED ORDER — METFORMIN HCL 500 MG PO TABS: 500.0000 mg | ORAL_TABLET | Freq: Every day | ORAL | 1 refills | Status: DC

## 2024-04-09 MED ORDER — BUPROPION HCL ER (XL) 150 MG PO TB24: 150.0000 mg | ORAL_TABLET | ORAL | 0 refills | Status: DC

## 2024-04-09 NOTE — Telephone Encounter (Signed)
 Pt called and no wellbutrin or metformin is at local pharmacy. I have resent it

## 2024-05-02 ENCOUNTER — Other Ambulatory Visit: Payer: Self-pay | Admitting: Medical

## 2024-05-02 NOTE — Telephone Encounter (Signed)
 Waiting for patient to reply. But he is coming in tomorrow for follow-up on moods

## 2024-05-02 NOTE — Telephone Encounter (Signed)
 Pt is doing ok and doesn't feel he needs to increase med at this time

## 2024-05-03 ENCOUNTER — Encounter: Payer: Self-pay | Admitting: Medical

## 2024-05-03 ENCOUNTER — Ambulatory Visit (INDEPENDENT_AMBULATORY_CARE_PROVIDER_SITE_OTHER): Admitting: Medical

## 2024-05-03 VITALS — BP 132/86 | HR 88 | Wt 277.0 lb

## 2024-05-03 DIAGNOSIS — E785 Hyperlipidemia, unspecified: Secondary | ICD-10-CM | POA: Diagnosis not present

## 2024-05-03 DIAGNOSIS — I152 Hypertension secondary to endocrine disorders: Secondary | ICD-10-CM

## 2024-05-03 DIAGNOSIS — E1159 Type 2 diabetes mellitus with other circulatory complications: Secondary | ICD-10-CM

## 2024-05-03 DIAGNOSIS — R4586 Emotional lability: Secondary | ICD-10-CM | POA: Diagnosis not present

## 2024-05-03 DIAGNOSIS — E118 Type 2 diabetes mellitus with unspecified complications: Secondary | ICD-10-CM | POA: Diagnosis not present

## 2024-05-03 MED ORDER — ENALAPRIL MALEATE 20 MG PO TABS: 20.0000 mg | ORAL_TABLET | Freq: Every day | ORAL | 1 refills | Status: DC

## 2024-05-03 MED ORDER — METFORMIN HCL 500 MG PO TABS: 500.0000 mg | ORAL_TABLET | Freq: Every day | ORAL | 1 refills | Status: DC

## 2024-05-03 NOTE — Patient Instructions (Signed)
 Check insurance to see if they cover Jardiance instead of Farxiga .  I would rather switch you to Jardiance as its safer in my opinion  BP today is great  Continue current medications.

## 2024-05-03 NOTE — Progress Notes (Signed)
 Subjective:  Andre [name] is a 43 y.o. male who presents for Chief Complaint  Patient presents with   Follow-up    Follow up.      Here for blood pressure follow-up, medication follow-up  We saw him a month ago for uncontrolled blood pressure, drop in blood pressure after starting Edarbichlor and mood concerns.  Last visit we stated Wellbutrin  XL 150 mg daily and he is doing pretty good on this.  He feels significant improvement and wants to stay at this dose.  High blood pressure-so far doing okay on Toprol -XL 100 mg daily and enalapril  20 mg daily.  No symptoms like he had prior on the samples of Edarbi chlor  He continues his other medicines as usual  Compliant with Farxiga  semaglutide  and metformin .  Blood sugars fasting lately around 150 or little higher.  No other aggravating or relieving factors.    No other c/o.  Past Medical History:  Diagnosis Date   Bell's palsy    at 6th or 7th grade on right side    Depression    Diabetes mellitus without complication (HCC)    2017   Elevated LFTs 2017   Hypertension    Obesity    OSA (obstructive sleep apnea)    Current Outpatient Medications on File Prior to Visit  Medication Sig Dispense Refill   Blood Glucose Monitoring Suppl DEVI Test 1-2 times day. Pend on Insurance 1 each 0   buPROPion  (WELLBUTRIN  XL) 150 MG 24 hr tablet TAKE 1 TABLET BY MOUTH EVERY MORNING 30 tablet 0   dapagliflozin  propanediol (FARXIGA ) 10 MG TABS tablet Take 1 tablet (10 mg total) by mouth daily before breakfast. 90 tablet 3   Glucose Blood (BLOOD GLUCOSE TEST STRIPS) STRP Test 1-2 times daily 100 strip 0   metoprolol  succinate (TOPROL  XL) 100 MG 24 hr tablet Take 1 tablet (100 mg total) by mouth daily. Take with or immediately following a meal. 90 tablet 2   rosuvastatin  (CRESTOR ) 20 MG tablet Take 1 tablet (20 mg total) by mouth daily. 90 tablet 3   Semaglutide , 2 MG/DOSE, 8 MG/3ML SOPN Inject 2 mg as directed once a week. 3 mL 2    cyanocobalamin (VITAMIN B12) 1000 MCG tablet Take 1 tablet (1,000 mcg total) by mouth daily. (Patient not taking: Reported on 05/03/2024) 90 tablet 0   Lancet Device MISC 1-2 times daily (Patient not taking: Reported on 05/03/2024) 1 each 0   Lancets Misc. MISC 1-2 times a day (Patient not taking: Reported on 05/03/2024) 100 each 0   MICROLET LANCETS MISC Test daily (Patient not taking: Reported on 05/03/2024) 100 each 11   No current facility-administered medications on file prior to visit.     The following portions of the patient's history were reviewed and updated as appropriate: allergies, current medications, past family history, past medical history, past social history, past surgical history and problem list.  ROS Otherwise as in subjective above    Objective: BP 132/86 (BP Location: Left Arm, Cuff Size: Large)   Pulse 88   Wt 277 lb (125.6 kg)   SpO2 94%   BMI 34.62 kg/m   BP Readings from Last 3 Encounters:  05/03/24 132/86  04/01/24 100/70  03/25/24 (!) 138/90   My personal readings today: 128/84 right arm, large cuff 132/86 left arm, large cuff   General appearance: alert, no distress, well developed, well nourished HEENT: normocephalic, sclerae anicteric, conjunctiva pink and moist, TMs pearly, nares patent, no discharge or erythema,  pharynx normal Oral cavity: MMM, no lesions Neck: supple, no lymphadenopathy, no thyromegaly, no masses Heart: RRR, normal S1, S2, no murmurs Lungs: CTA bilaterally, no wheezes, rhonchi, or rales Pulses: 2+ radial pulses, 2+ pedal pulses, normal cap refill Ext: no edema   Assessment: Encounter Diagnoses  Name Primary?   Hypertension associated with diabetes (HCC) Yes   Hyperlipidemia, unspecified hyperlipidemia type    Type 2 diabetes with complication (HCC)    Mood change      Plan: Hypertension-improved today.  Continue Toprol -XL 100 mg daily, enalapril  20 mg daily.  Advise he monitor blood pressures periodically.  His  diastolic tends to run around 90.  But my personal readings today were at goal.  We discussed possibly lowering the beta-blocker dose and adding other medicine in the future if we need to.  He had quite a bit of drop blood pressure on Edarbichlor prior to last visit with the samples we gave him.  But for now we will use the current regimen of Toprol -XL 100mg  and enalapril  20mg   Hyperlipidemia-continue rosuvastatin  Crestor  20 mg daily  Diabetes-continue semaglutide  2 mg weekly, metformin  500 mg daily and Farxiga  10 mg daily.  We discussed possibly switching to Jardiance instead of Farxiga .  He will check insurance on this.  mood change-so far doing quite well on Wellbutrin  XL 150 mg started last month.    Andre "Ace Holder" was seen today for follow-up.  Diagnoses and all orders for this visit:  Hypertension associated with diabetes (HCC)  Hyperlipidemia, unspecified hyperlipidemia type  Type 2 diabetes with complication (HCC)  Mood change  Other orders -     metFORMIN  (GLUCOPHAGE ) 500 MG tablet; Take 1 tablet (500 mg total) by mouth daily with breakfast. -     enalapril  (VASOTEC ) 20 MG tablet; Take 1 tablet (20 mg total) by mouth daily.    Follow up: 2 months

## 2024-05-17 LAB — HM DIABETES EYE EXAM

## 2024-05-23 ENCOUNTER — Telehealth: Payer: Self-pay | Admitting: Internal Medicine

## 2024-05-23 ENCOUNTER — Encounter: Payer: Self-pay | Admitting: Internal Medicine

## 2024-05-23 NOTE — Telephone Encounter (Signed)
-----   Message from Lovett Ruck sent at 05/23/2024 10:53 AM EDT ----- abstract ----- Message ----- From: Bascom Lily, CMA Sent: 05/23/2024  10:32 AM EDT To: Claudene Crystal, PA-C

## 2024-05-23 NOTE — Telephone Encounter (Signed)
 Abstracted

## 2024-05-31 ENCOUNTER — Other Ambulatory Visit: Payer: Self-pay | Admitting: Medical

## 2024-06-01 ENCOUNTER — Other Ambulatory Visit: Payer: Self-pay | Admitting: Medical

## 2024-06-01 DIAGNOSIS — E118 Type 2 diabetes mellitus with unspecified complications: Secondary | ICD-10-CM

## 2024-06-03 ENCOUNTER — Telehealth: Payer: Self-pay

## 2024-06-03 ENCOUNTER — Other Ambulatory Visit (HOSPITAL_COMMUNITY): Payer: Self-pay

## 2024-06-03 NOTE — Telephone Encounter (Signed)
 Pharmacy Patient Advocate Encounter   Received notification from CoverMyMeds that prior authorization for Ozempic  (2 MG/DOSE) 8MG /3ML pen-injectors is required/requested.   Insurance verification completed.   The patient is insured through [facility] .   Per test claim: PA required; PA submitted to above mentioned insurance via CoverMyMeds Key/confirmation #/EOC (Key: [id number])     Status is pending

## 2024-06-04 ENCOUNTER — Other Ambulatory Visit (HOSPITAL_COMMUNITY): Payer: Self-pay

## 2024-06-04 NOTE — Telephone Encounter (Signed)
 Pharmacy Patient Advocate Encounter  Received notification from [employer] that Prior Authorization for Ozempic  (2 MG/DOSE) 8MG /3ML pen-injectors has been APPROVED from 6.9.25 to 6.9.26. Ran test claim, Copay is $RTS, RX WAS LAST FILLED ON 05/31/24. This test claim was processed through [facility]- copay amounts may vary at other pharmacies due to pharmacy/plan contracts, or as the patient moves through the different stages of their insurance plan.   PA #/Case ID/Reference #: (Key: [id number])

## 2024-06-27 ENCOUNTER — Other Ambulatory Visit: Payer: Self-pay | Admitting: Medical

## 2024-06-27 ENCOUNTER — Encounter: Admitting: Medical

## 2024-06-27 NOTE — Telephone Encounter (Signed)
Pt needs a refill on this.

## 2024-06-27 NOTE — Telephone Encounter (Signed)
 Waiting on patient to reply to let me know if he needs a refill for his appt next week. Originally had an appt today but rescheduled it

## 2024-07-05 ENCOUNTER — Ambulatory Visit (INDEPENDENT_AMBULATORY_CARE_PROVIDER_SITE_OTHER): Admitting: Medical

## 2024-07-05 ENCOUNTER — Encounter: Payer: Self-pay | Admitting: Medical

## 2024-07-05 VITALS — BP 124/82 | HR 83 | Wt 269.4 lb

## 2024-07-05 DIAGNOSIS — Z136 Encounter for screening for cardiovascular disorders: Secondary | ICD-10-CM

## 2024-07-05 DIAGNOSIS — E1159 Type 2 diabetes mellitus with other circulatory complications: Secondary | ICD-10-CM

## 2024-07-05 DIAGNOSIS — E118 Type 2 diabetes mellitus with unspecified complications: Secondary | ICD-10-CM | POA: Diagnosis not present

## 2024-07-05 DIAGNOSIS — Z122 Encounter for screening for malignant neoplasm of respiratory organs: Secondary | ICD-10-CM

## 2024-07-05 DIAGNOSIS — I152 Hypertension secondary to endocrine disorders: Secondary | ICD-10-CM

## 2024-07-05 DIAGNOSIS — Z2820 Immunization not carried out because of patient decision for unspecified reason: Secondary | ICD-10-CM

## 2024-07-05 DIAGNOSIS — F17200 Nicotine dependence, unspecified, uncomplicated: Secondary | ICD-10-CM

## 2024-07-05 DIAGNOSIS — E785 Hyperlipidemia, unspecified: Secondary | ICD-10-CM

## 2024-07-05 DIAGNOSIS — D751 Secondary polycythemia: Secondary | ICD-10-CM | POA: Insufficient documentation

## 2024-07-05 MED ORDER — ASPIRIN 81 MG PO TBEC: 81.0000 mg | DELAYED_RELEASE_TABLET | Freq: Every day | ORAL | 3 refills | Status: DC

## 2024-07-05 MED ORDER — EMPAGLIFLOZIN 10 MG PO TABS: 10.0000 mg | ORAL_TABLET | Freq: Every day | ORAL | 2 refills | Status: DC

## 2024-07-05 MED ORDER — METFORMIN HCL 500 MG PO TABS: 500.0000 mg | ORAL_TABLET | Freq: Every day | ORAL | 2 refills | Status: AC

## 2024-07-05 MED ORDER — METOPROLOL SUCCINATE ER 100 MG PO TB24: 100.0000 mg | ORAL_TABLET | Freq: Every day | ORAL | 2 refills | Status: AC

## 2024-07-05 MED ORDER — ENALAPRIL MALEATE 20 MG PO TABS: 20.0000 mg | ORAL_TABLET | Freq: Every day | ORAL | 2 refills | Status: AC

## 2024-07-05 MED ORDER — OZEMPIC (2 MG/DOSE) 8 MG/3ML ~~LOC~~ SOPN: 2.0000 mg | PEN_INJECTOR | SUBCUTANEOUS | 2 refills | Status: DC

## 2024-07-05 MED ORDER — BUPROPION HCL ER (XL) 150 MG PO TB24: 150.0000 mg | ORAL_TABLET | Freq: Every morning | ORAL | 2 refills | Status: DC

## 2024-07-05 MED ORDER — ROSUVASTATIN CALCIUM 20 MG PO TABS: 20.0000 mg | ORAL_TABLET | Freq: Every day | ORAL | 2 refills | Status: AC

## 2024-07-05 NOTE — Progress Notes (Signed)
 Subjective:  Andre [name] is a 43 y.o. male who presents for Chief Complaint  Patient presents with   other    4 month f/u on diabetes, check A1c,      Here with his wife today.  Diabetes - Has glucometer, but not really checking.  Compliant with metformin  500 mg once daily.  Compliant with Ozempic  2 mg weekly.  Compliant with Farxiga   Hypertension-compliant with enalapril  20 mg daily and Toprol -XL 100 mg daily  Hyperlipidemia-compliant with rosuvastatin  Crestor  10 mg daily without complaint  He does not check blood pressures at home  Active and exercising on the job  He does continue to smoke.  Been smoking greater than 20 years  Wife is concerned about heart disease testing  No other aggravating or relieving factors.    No other c/o.  Past Medical History:  Diagnosis Date   Bell's palsy    at 6th or 7th grade on right side    Depression    Diabetes mellitus without complication (HCC)    2017   Elevated LFTs 2017   Hypertension    Obesity    OSA (obstructive sleep apnea)    Current Outpatient Medications on File Prior to Visit  Medication Sig Dispense Refill   Blood Glucose Monitoring Suppl (ACCU-CHEK GUIDE) w/Device KIT USE TO TEST BLOOD GLUCOSE LEVELS 1-2 TIMES DAILY 1 kit 1   Glucose Blood (BLOOD GLUCOSE TEST STRIPS) STRP Test 1-2 times daily 100 strip 0   Lancet Device MISC 1-2 times daily 1 each 0   Lancets Misc. MISC 1-2 times a day 100 each 0   MICROLET LANCETS MISC Test daily 100 each 11   No current facility-administered medications on file prior to visit.    The following portions of the patient's history were reviewed and updated as appropriate: allergies, current medications, past family history, past medical history, past social history, past surgical history and problem list.  ROS Otherwise as in subjective above    Objective: BP 124/82   Pulse 83   Wt 269 lb 6.4 oz (122.2 kg)   BMI 33.67 kg/m   Wt Readings from Last 3  Encounters:  07/05/24 269 lb 6.4 oz (122.2 kg)  05/03/24 277 lb (125.6 kg)  04/01/24 272 lb 9.6 oz (123.7 kg)   BP Readings from Last 3 Encounters:  07/05/24 124/82  05/03/24 132/86  04/01/24 100/70    General appearance: alert, no distress, well developed, well nourished Neck: supple, no lymphadenopathy, no thyromegaly, no masses, no bruits Heart: RRR, normal S1, S2, no murmurs Lungs: CTA bilaterally, no wheezes, rhonchi, or rales Pulses: 2+ radial pulses, 2+ pedal pulses, normal cap refill Ext: no edema  Diabetic Foot Exam - Simple   Simple Foot Form Diabetic Foot exam was performed with the following findings: Yes 07/05/2024  9:44 AM  Visual Inspection See comments: Yes Sensation Testing Intact to touch and monofilament testing bilaterally: Yes Pulse Check Posterior Tibialis and Dorsalis pulse intact bilaterally: Yes Comments Relatively flat feet       Assessment: Encounter Diagnoses  Name Primary?   Type 2 diabetes with complication (HCC) Yes   Screening for lung cancer    Smoker    Hypertension associated with diabetes (HCC)    Hyperlipidemia, unspecified hyperlipidemia type    Vaccine refused by patient    Erythrocytosis    Screening for heart disease      Plan: Diabetes I recommend you monitor your blood sugars at least.  Goal is fasting between  70 and 130 Continue current medication metformin  500 mg daily, Ozempic  2 mg weekly.  Lets change from Farxiga  to Jardiance  given that you so bad is not well-hydrated on the Farxiga .  I think the Jardiance  would be a safer option for you Continue daily foot checks I reviewed your up-to-date ophthalmology exam with no diabetic retinopathy  Hyperlipidemia-continue rosuvastatin  Crestor  20 mg daily.  If not taking aspirin  already I do recommend you start taking aspirin  81 mg daily  Hypertension Continue Toprol -XL 100 mg daily and enalapril  20 mg daily  Smoker-I strongly recommend you quit tobacco  Erythrocytosis,  elevated hemoglobin-I recommend you consider donating blood quarterly to reduce your risk of blood clot  Referral for lung cancer screening CT  You declined vaccines.  Screening for heart disease-we discussed his April 2025 EKG.  Discussed other potential screenings  [name] was seen today for other.  Diagnoses and all orders for this visit:  Type 2 diabetes with complication (HCC) -     Hemoglobin A1c  Screening for lung cancer -     CT CHEST LUNG CA SCREEN LOW DOSE W/O CM; Future  Smoker  Hypertension associated with diabetes (HCC) -     CBC -     Basic metabolic panel with GFR  Hyperlipidemia, unspecified hyperlipidemia type  Vaccine refused by patient  Erythrocytosis  Screening for heart disease  Other orders -     empagliflozin  (JARDIANCE ) 10 MG TABS tablet; Take 1 tablet (10 mg total) by mouth daily before breakfast. -     Semaglutide , 2 MG/DOSE, (OZEMPIC , 2 MG/DOSE,) 8 MG/3ML SOPN; Inject 2 mg into the skin once a week. -     rosuvastatin  (CRESTOR ) 20 MG tablet; Take 1 tablet (20 mg total) by mouth daily. -     buPROPion  (WELLBUTRIN  XL) 150 MG 24 hr tablet; Take 1 tablet (150 mg total) by mouth every morning. -     enalapril  (VASOTEC ) 20 MG tablet; Take 1 tablet (20 mg total) by mouth daily. -     metoprolol  succinate (TOPROL  XL) 100 MG 24 hr tablet; Take 1 tablet (100 mg total) by mouth daily. Take with or immediately following a meal. -     metFORMIN  (GLUCOPHAGE ) 500 MG tablet; Take 1 tablet (500 mg total) by mouth daily with breakfast. -     aspirin  EC 81 MG tablet; Take 1 tablet (81 mg total) by mouth daily.    Follow up: pending labs, CT

## 2024-07-06 LAB — CBC
Hematocrit: 52.3 % — ABNORMAL HIGH (ref 37.5–51.0)
Hemoglobin: 17.6 g/dL (ref 13.0–17.7)
MCH: 32.4 pg (ref 26.6–33.0)
MCHC: 33.7 g/dL (ref 31.5–35.7)
MCV: 96 fL (ref 79–97)
Platelets: 210 x10E3/uL (ref 150–450)
RBC: 5.43 x10E6/uL (ref 4.14–5.80)
RDW: 11.7 % (ref 11.6–15.4)
WBC: 7.8 x10E3/uL (ref 3.4–10.8)

## 2024-07-06 LAB — BASIC METABOLIC PANEL WITH GFR
BUN/Creatinine Ratio: 13 (ref 9–20)
BUN: 12 mg/dL (ref 6–24)
CO2: 22 mmol/L (ref 20–29)
Calcium: 9.2 mg/dL (ref 8.7–10.2)
Chloride: 102 mmol/L (ref 96–106)
Creatinine, Ser: 0.96 mg/dL (ref 0.76–1.27)
Glucose: 128 mg/dL — AB (ref 70–99)
Potassium: 4.4 mmol/L (ref 3.5–5.2)
Sodium: 140 mmol/L (ref 134–144)
eGFR: 101 mL/min/1.73 (ref >59)

## 2024-07-06 LAB — HEMOGLOBIN A1C
Est. average glucose Bld gHb Est-mCnc: 157 mg/dL
Hgb A1c MFr Bld: 7.1 % — ABNORMAL HIGH (ref 4.8–5.6)

## 2024-07-08 ENCOUNTER — Ambulatory Visit: Admitting: Medical

## 2024-07-08 ENCOUNTER — Ambulatory Visit: Payer: Self-pay | Admitting: Medical

## 2024-07-08 NOTE — Progress Notes (Signed)
 Hemoglobin A1c is 7.1%.  Hemoglobin was okay.  Electrolytes and kidney okay.  Continue plan to change from Farxiga  to Jardiance .  Continue rest of medicine as usual  Expect phone call about the CT chest

## 2024-07-10 ENCOUNTER — Other Ambulatory Visit: Payer: Self-pay | Admitting: Medical

## 2024-07-10 ENCOUNTER — Telehealth: Payer: Self-pay | Admitting: Internal Medicine

## 2024-07-10 NOTE — Telephone Encounter (Signed)
 [facility] called and states pt is can not have a CT lung cancer screening since he is not 43 years old. You can change it to CT Chest without Contrast and they can do it.

## 2024-07-10 NOTE — Telephone Encounter (Signed)
 Patient was notified.

## 2024-07-25 ENCOUNTER — Telehealth: Payer: Self-pay | Admitting: Internal Medicine

## 2024-07-25 NOTE — Telephone Encounter (Signed)
 Patient called and would like to know if you could prescribe something for his sciatica. Pain is going from his back running down his left leg. He was often an appointment but he is with his son today in a bunch of therapies for him and can't make it today.   Wanted to see if you would give him a muscle relaxer

## 2024-07-26 ENCOUNTER — Other Ambulatory Visit: Payer: Self-pay | Admitting: Medical

## 2024-07-26 ENCOUNTER — Ambulatory Visit: Payer: Self-pay | Admitting: Medical

## 2024-07-26 MED ORDER — TIZANIDINE HCL 4 MG PO TABS: 4.0000 mg | ORAL_TABLET | Freq: Every evening | ORAL | 0 refills | Status: DC | PRN

## 2024-07-26 MED ORDER — PREDNISONE 20 MG PO TABS: ORAL_TABLET | ORAL | 0 refills | Status: DC

## 2024-07-26 NOTE — Telephone Encounter (Signed)
 Pt was notified.

## 2024-07-26 NOTE — Progress Notes (Signed)
 Abstract eye exam if it has not been done

## 2024-07-31 ENCOUNTER — Other Ambulatory Visit: Payer: Self-pay | Admitting: Medical

## 2024-08-13 ENCOUNTER — Ambulatory Visit: Admitting: Family Medicine

## 2024-08-13 ENCOUNTER — Encounter: Payer: Self-pay | Admitting: Family Medicine

## 2024-08-13 VITALS — BP 134/90 | HR 108 | Temp 98.0°F | Wt 267.6 lb

## 2024-08-13 DIAGNOSIS — J069 Acute upper respiratory infection, unspecified: Secondary | ICD-10-CM

## 2024-08-13 MED ORDER — METHYLPREDNISOLONE 4 MG PO TBPK: ORAL_TABLET | ORAL | 0 refills | Status: DC

## 2024-08-13 NOTE — Progress Notes (Signed)
   Name: Andre [name]   Date of Visit: 08/13/24   Date of last visit with me: Visit date not found   CHIEF COMPLAINT:  Chief Complaint  Patient presents with   Acute Visit    Fever, body aches, congestions. Fever was 102 yesterday. Went down to 99 last night. Back up to 100 today. Knots on his head.        HPI:  Discussed the use of AI scribe software for clinical note transcription with the patient, who gave verbal consent to proceed.  History of Present Illness   Andre [name] is a 43 year old male who presents with headaches and tender lumps on his head.  He has been experiencing headaches and tender lumps on his head for the past two to three days. The lumps are very tender and initially appeared on one side before developing on the other. No history of migraines is noted.  He reports intermittent fever that recurs. He has been taking Tylenol for the fever and ibuprofen  for back pain. He feels nauseous, which he attributes to his medication, but denies vomiting.  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 No cough or sore throat is present, but he mentions a dry mouth and feeling very thirsty.  He has been eating lightly and reports feeling nauseous but denies vomiting. There is no known exposure to sick individuals, and his family is not sick. He recently engaged in pressure washing his house over the weekend.         OBJECTIVE:       07/05/2024    9:11 AM  Depression screen PHQ 2/9  Decreased Interest 0  Down, Depressed, Hopeless 0  PHQ - 2 Score 0     BP Readings from Last 3 Encounters:  08/13/24 (!) 134/90  07/05/24 124/82  05/03/24 132/86    BP (!) 134/90   Pulse (!) 108   Temp 98 F (36.7 C)   Wt 267 lb 9.6 oz (121.4 kg)   SpO2 95%   BMI 33.45 kg/m    Physical Exam   HEENT: Tender lumps on the head.      Physical  Exam Constitutional:      Appearance: Normal appearance.  HENT:     Head: Normocephalic and atraumatic.     Nose: Congestion and rhinorrhea present.  Eyes:     General:        Right eye: No discharge.        Left eye: No discharge.  Neurological:     General: No focal deficit present.     Mental Status: He is alert and oriented to person, place, and time. Mental status is at baseline.     ASSESSMENT/PLAN:   Assessment & Plan Viral URI    Assessment and Plan    Acute viral syndrome with headache and tender scalp lymphadenopathy Likely viral etiology given symptom duration and presentation. Discussed inflammatory nature of headache and lymphadenopathy. Considered steroid use despite previous adverse reactions. Deemed comprehensive respiratory panel unnecessary. - Prescribed short course of steroids with tapering schedule. - Advised alternating acetaminophen and ibuprofen  every four hours for pain and fever. - Instructed to monitor for persistent fever, hypotension, or altered mental status. - Sent prescription to [facility] in [facility].         Kedra Mcglade A. Vita MD [facility]

## 2024-08-17 ENCOUNTER — Emergency Department (HOSPITAL_BASED_OUTPATIENT_CLINIC_OR_DEPARTMENT_OTHER)
Admission: EM | Admit: 2024-08-17 | Discharge: 2024-08-17 | Disposition: A | Discharge status: Home / Self Care | Payer: Self-pay

## 2024-08-17 ENCOUNTER — Other Ambulatory Visit: Payer: Self-pay

## 2024-08-17 ENCOUNTER — Emergency Department (HOSPITAL_BASED_OUTPATIENT_CLINIC_OR_DEPARTMENT_OTHER): Payer: Self-pay

## 2024-08-17 ENCOUNTER — Ambulatory Visit: Payer: Self-pay

## 2024-08-17 ENCOUNTER — Emergency Department (HOSPITAL_COMMUNITY): Payer: Self-pay

## 2024-08-17 ENCOUNTER — Encounter (HOSPITAL_BASED_OUTPATIENT_CLINIC_OR_DEPARTMENT_OTHER): Payer: Self-pay

## 2024-08-17 DIAGNOSIS — Z7984 Long term (current) use of oral hypoglycemic drugs: Secondary | ICD-10-CM | POA: Insufficient documentation

## 2024-08-17 DIAGNOSIS — M545 Low back pain, unspecified: Secondary | ICD-10-CM | POA: Diagnosis present

## 2024-08-17 DIAGNOSIS — E119 Type 2 diabetes mellitus without complications: Secondary | ICD-10-CM | POA: Insufficient documentation

## 2024-08-17 DIAGNOSIS — Z79899 Other long term (current) drug therapy: Secondary | ICD-10-CM | POA: Insufficient documentation

## 2024-08-17 DIAGNOSIS — M5127 Other intervertebral disc displacement, lumbosacral region: Secondary | ICD-10-CM

## 2024-08-17 DIAGNOSIS — R930 Abnormal findings on diagnostic imaging of skull and head, not elsewhere classified: Secondary | ICD-10-CM | POA: Diagnosis not present

## 2024-08-17 DIAGNOSIS — R7982 Elevated C-reactive protein (CRP): Secondary | ICD-10-CM | POA: Insufficient documentation

## 2024-08-17 DIAGNOSIS — R5381 Other malaise: Secondary | ICD-10-CM | POA: Diagnosis not present

## 2024-08-17 DIAGNOSIS — Z7982 Long term (current) use of aspirin: Secondary | ICD-10-CM | POA: Insufficient documentation

## 2024-08-17 DIAGNOSIS — R509 Fever, unspecified: Secondary | ICD-10-CM | POA: Insufficient documentation

## 2024-08-17 LAB — CBC
HCT: 45.5 % (ref 39.0–52.0)
Hemoglobin: 15.9 g/dL (ref 13.0–17.0)
MCH: 31.9 pg (ref 26.0–34.0)
MCHC: 34.9 g/dL (ref 30.0–36.0)
MCV: 91.4 fL (ref 80.0–100.0)
Platelets: 212 K/uL (ref 150–400)
RBC: 4.98 MIL/uL (ref 4.22–5.81)
RDW: 10.8 % — ABNORMAL LOW (ref 11.5–15.5)
WBC: 9.7 K/uL (ref 4.0–10.5)
nRBC: 0 % (ref 0.0–0.2)

## 2024-08-17 LAB — COMPREHENSIVE METABOLIC PANEL WITH GFR
ALT: 12 U/L (ref 0–44)
AST: 15 U/L (ref 15–41)
Albumin: 3.8 g/dL (ref 3.5–5.0)
Alkaline Phosphatase: 69 U/L (ref 38–126)
Anion gap: 14 (ref 5–15)
BUN: 18 mg/dL (ref 6–20)
CO2: 22 mmol/L (ref 22–32)
Calcium: 9 mg/dL (ref 8.9–10.3)
Chloride: 99 mmol/L (ref 98–111)
Creatinine, Ser: 1.03 mg/dL (ref 0.61–1.24)
GFR, Estimated: >60 mL/min (ref >60)
Glucose, Bld: 211 mg/dL — ABNORMAL HIGH (ref 70–99)
Potassium: 4 mmol/L (ref 3.5–5.1)
Sodium: 135 mmol/L (ref 135–145)
Total Bilirubin: 1 mg/dL (ref 0.0–1.2)
Total Protein: 6.9 g/dL (ref 6.5–8.1)

## 2024-08-17 LAB — URINALYSIS, ROUTINE W REFLEX MICROSCOPIC
Bilirubin Urine: NEGATIVE
Glucose, UA: >=500 mg/dL — AB
Ketones, ur: 40 mg/dL — AB
Leukocytes,Ua: NEGATIVE
Nitrite: NEGATIVE
Protein, ur: 30 mg/dL — AB
Specific Gravity, Urine: 1.01 (ref 1.005–1.030)
pH: 5.5 (ref 5.0–8.0)

## 2024-08-17 LAB — RESP PANEL BY RT-PCR (RSV, FLU A&B, COVID)  RVPGX2
Influenza A by PCR: NEGATIVE
Influenza B by PCR: NEGATIVE
Resp Syncytial Virus by PCR: NEGATIVE
SARS Coronavirus 2 by RT PCR: NEGATIVE

## 2024-08-17 LAB — URINALYSIS, MICROSCOPIC (REFLEX)

## 2024-08-17 LAB — C-REACTIVE PROTEIN: CRP: 17.9 mg/dL — ABNORMAL HIGH (ref <1.0)

## 2024-08-17 LAB — CBG MONITORING, ED: Glucose-Capillary: 223 mg/dL — ABNORMAL HIGH (ref 70–99)

## 2024-08-17 LAB — SEDIMENTATION RATE: Sed Rate: 30 mm/h — ABNORMAL HIGH (ref 0–16)

## 2024-08-17 MED ORDER — ACETAMINOPHEN 500 MG PO TABS
1000.0000 mg | ORAL_TABLET | Freq: Once | ORAL | Status: AC
  Administered 2024-08-17: 1000 mg via ORAL
  Filled 2024-08-17: qty 2

## 2024-08-17 MED ORDER — SODIUM CHLORIDE 0.9 % IV BOLUS: 1000.0000 mL | Freq: Once | INTRAVENOUS | Status: DC

## 2024-08-17 MED ORDER — GADOBUTROL 1 MMOL/ML IV SOLN
10.0000 mL | Freq: Once | INTRAVENOUS | Status: AC | PRN
  Administered 2024-08-17: 10 mL via INTRAVENOUS

## 2024-08-17 MED ORDER — LIDOCAINE 5 % EX PTCH
1.0000 | MEDICATED_PATCH | Freq: Once | CUTANEOUS | Status: AC
  Administered 2024-08-17: 1 via TRANSDERMAL
  Filled 2024-08-17: qty 1

## 2024-08-17 MED ORDER — KETOROLAC TROMETHAMINE 15 MG/ML IJ SOLN
15.0000 mg | Freq: Once | INTRAMUSCULAR | Status: AC
  Administered 2024-08-17: 15 mg via INTRAVENOUS
  Filled 2024-08-17: qty 1

## 2024-08-17 MED ORDER — METOCLOPRAMIDE HCL 5 MG/ML IJ SOLN: 10.0000 mg | Freq: Once | INTRAMUSCULAR | Status: DC

## 2024-08-17 MED ORDER — OXYCODONE HCL 5 MG PO TABS: 5.0000 mg | ORAL_TABLET | Freq: Once | ORAL | Status: DC

## 2024-08-17 NOTE — ED Notes (Signed)
 PT D/C'D AFTER INSTRUCTIONS REVIEWED. PT VERBALIZED UNDERSTANDING. NAD REPORTED OR NOTED AT THIS TIME.

## 2024-08-17 NOTE — ED Provider Notes (Signed)
  [place] [facility]ERGENCY DEPARTMENT AT [facility] Provider Note   CSN: [id number] Arrival date & time: 08/17/24  0930  Patient presents with: Fever and Back Pain  Andre [name] is a 43 y.o. male.  Patient transferred from [facility] to [facility] for MRI rule out of spinal epidural abscess. Patient presented to [facility] for fever and back pain, arrived afebrile without leukocytosis. Found to have elevated ESR and CRP. CT scans, CXR without obvious infectious process. Patient has hx diabetes, prior instrumentation in cervical spine, and has been taking steroids for the past few days.   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   Discussed findings with patient. Confirmed patient without recent tick bites or rashes. No known sick contacts. Does endorse work with horses. Will order RMSF antibodies. Low concern for meningitis given absence of neck rigidity or tenderness. Suspect possible concomitant viral infection explaining fever.   Plan for follow up with PCP soon to further investigate symptoms. Patient elected to continue taking Tylenol  and ibuprofen  at home for pain. Provided patient with contact information for Neurosurgery team per request. Patient stable for discharge home.     Diona Perkins, MD 08/17/24 [id number]    Doretha Folks, MD 08/19/24 [phone]

## 2024-08-17 NOTE — ED Notes (Signed)
 Patient transported to CT

## 2024-08-17 NOTE — Discharge Instructions (Addendum)
 Your workup was reassuring against an infection in your back. You do have a protruding disc in your back that may explain the pain you've been having. Please take Tylenol  and Ibuprofen  as needed. Plan to see your doctor as soon as possible.   If needed, here is contact info for [facility]:  Main: [phone] [facility] office: [phone]

## 2024-08-17 NOTE — ED Notes (Signed)
 Unable to provide u/a at present. Cup at bedside. Given Gatorade

## 2024-08-17 NOTE — ED Provider Notes (Signed)
 [place] EMERGENCY DEPARTMENT AT [facility] Provider Note   CSN: [id number] Arrival date & time: 08/17/24  0930     Patient presents with: Fever and Back Pain   Andre [name] is a 43 y.o. male.   Is a 43 year old male presenting emergency department with general malaise, fevers, and low back pain.  Reported feeling unwell on 8/15, developed fevers on Monday.  Saw PCP and reportedly tested negative for flu/COVID and was placed on steroids.  Reports developing midline low back pain just above sacrum extending across the left iliac crest.  Reports no history of back pain.  No numbness in perineum.  No bowel or bladder incontinence, but does have some hesitancy.  Reports pain with moving left lower extremity, but denies weakness.  Wife notes he has had near daily fevers, with the highest of 102.  No recent travel.  No new animal exposures.  No tick bites.  No rashes.   Fever Back Pain Associated symptoms: fever        Prior to Admission medications   Medication Sig Start Date End Date Taking? Authorizing Provider  aspirin  EC 81 MG tablet Take 1 tablet (81 mg total) by mouth daily. 07/05/24   Tysinger, Alm RAMAN, PA-C  Blood Glucose Monitoring Suppl (ACCU-CHEK GUIDE) w/Device KIT USE TO TEST BLOOD GLUCOSE LEVELS 1-2 TIMES DAILY 06/03/24   Tysinger, Alm RAMAN, PA-C  buPROPion  (WELLBUTRIN  XL) 150 MG 24 hr tablet Take 1 tablet (150 mg total) by mouth every morning. 07/05/24   Tysinger, Alm RAMAN, PA-C  empagliflozin  (JARDIANCE ) 10 MG TABS tablet Take 1 tablet (10 mg total) by mouth daily before breakfast. 07/05/24   Tysinger, Alm RAMAN, PA-C  enalapril  (VASOTEC ) 20 MG tablet Take 1 tablet (20 mg total) by mouth daily. 07/05/24   Tysinger, Alm RAMAN, PA-C  Glucose Blood (BLOOD GLUCOSE TEST STRIPS) STRP Test 1-2 times daily 04/04/24   Bulah Alm RAMAN, PA-C  Lancet Device MISC 1-2 times daily 04/04/24   Tysinger, Alm RAMAN, PA-C  Lancets Misc. MISC 1-2 times a day 04/04/24   Tysinger, Alm RAMAN, PA-C   metFORMIN  (GLUCOPHAGE ) 500 MG tablet Take 1 tablet (500 mg total) by mouth daily with breakfast. 07/05/24   Tysinger, Alm RAMAN, PA-C  methylPREDNISolone  (MEDROL  DOSEPAK) 4 MG TBPK tablet Take as directed. 08/13/24   Jha, Panav, MD  metoprolol  succinate (TOPROL  XL) 100 MG 24 hr tablet Take 1 tablet (100 mg total) by mouth daily. Take with or immediately following a meal. 07/05/24 07/05/25  Tysinger, Alm RAMAN, PA-C  MICROLET LANCETS MISC Test daily 10/18/18   Tysinger, Alm RAMAN, PA-C  rosuvastatin  (CRESTOR ) 20 MG tablet Take 1 tablet (20 mg total) by mouth daily. 07/05/24   Tysinger, Alm RAMAN, PA-C  Semaglutide , 2 MG/DOSE, (OZEMPIC , 2 MG/DOSE,) 8 MG/3ML SOPN DIAL AND INJECT UNDER THE SKIN 2 MG WEEKLY 07/31/24   Tysinger, Alm RAMAN, PA-C  tiZANidine  (ZANAFLEX ) 4 MG tablet Take 1 tablet (4 mg total) by mouth at bedtime as needed for muscle spasms. 07/26/24 07/26/25  Tysinger, Alm RAMAN, PA-C    Allergies: Influenza vaccines, Janumet  [sitagliptin  phos-metformin  hcl], Morphine and codeine, Other, and Tramadol    Review of Systems  Constitutional:  Positive for fever.  Musculoskeletal:  Positive for back pain.    Updated Vital Signs BP (!) 146/92   Pulse 77   Temp 97.9 F (36.6 C)   Resp 18   Ht 6' 3 (1.905 m)   Wt 119.7 kg   SpO2 96%  BMI 33.00 kg/m   Physical Exam Vitals and nursing note reviewed.  Constitutional:      General: He is not in acute distress.    Appearance: He is obese. He is not toxic-appearing.  HENT:     Head: Normocephalic.     Nose: Nose normal.     Mouth/Throat:     Mouth: Mucous membranes are moist.  Eyes:     Conjunctiva/sclera: Conjunctivae normal.  Cardiovascular:     Rate and Rhythm: Normal rate. Rhythm irregular.  Pulmonary:     Effort: Pulmonary effort is normal.     Breath sounds: Normal breath sounds.  Abdominal:     General: Abdomen is flat. There is no distension.     Tenderness: There is no abdominal tenderness. There is no guarding or rebound.   Musculoskeletal:     Comments: Does have some tenderness to the L5 L4 area extending across the left iliac crest.  Has normal sensation in lower extremities.  Equal pulses.  5 out of 5 plantarflexion dorsiflexion.  Skin:    Capillary Refill: Capillary refill takes less than 2 seconds.  Neurological:     Mental Status: He is alert.  Psychiatric:        Mood and Affect: Mood normal.        Behavior: Behavior normal.     (all labs ordered are listed, but only abnormal results are displayed) Labs Reviewed  URINALYSIS, ROUTINE W REFLEX MICROSCOPIC - Abnormal; Notable for the following components:      Result Value   Glucose, UA >=500 (*)    Hgb urine dipstick TRACE (*)    Ketones, ur 40 (*)    Protein, ur 30 (*)    All other components within normal limits  CBC - Abnormal; Notable for the following components:   RDW 10.8 (*)    All other components within normal limits  COMPREHENSIVE METABOLIC PANEL WITH GFR - Abnormal; Notable for the following components:   Glucose, Bld 211 (*)    All other components within normal limits  SEDIMENTATION RATE - Abnormal; Notable for the following components:   Sed Rate 30 (*)    All other components within normal limits  C-REACTIVE PROTEIN - Abnormal; Notable for the following components:   CRP 17.9 (*)    All other components within normal limits  URINALYSIS, MICROSCOPIC (REFLEX) - Abnormal; Notable for the following components:   Bacteria, UA RARE (*)    All other components within normal limits  CBG MONITORING, ED - Abnormal; Notable for the following components:   Glucose-Capillary 223 (*)    All other components within normal limits  RESP PANEL BY RT-PCR (RSV, FLU A&B, COVID)  [id number]  CULTURE, BLOOD (ROUTINE X 2)  CULTURE, BLOOD (ROUTINE X 2)    EKG: None  Radiology: DG Chest Portable 1 View Result Date: 08/17/2024 CLINICAL DATA:  43 year old. EXAM: PORTABLE CHEST 1 VIEW COMPARISON:  Chest radiograph dated 04/07/2014. FINDINGS: No  focal consolidation, pleural effusion or pneumothorax. Top-normal cardiac silhouette. No acute osseous pathology. IMPRESSION: No active disease. Electronically Signed   By: Vanetta Chou M.D.   On: 08/17/2024 12:30   CT L-SPINE NO CHARGE Result Date: 08/17/2024 CLINICAL DATA:  Back pain. EXAM: CT LUMBAR SPINE WITHOUT CONTRAST TECHNIQUE: Multidetector CT imaging of the lumbar spine was performed without intravenous contrast administration. Multiplanar CT image reconstructions were also generated. RADIATION DOSE REDUCTION: This exam was performed according to the departmental dose-optimization program which includes automated exposure control, adjustment of  the mA and/or kV according to patient size and/or use of iterative reconstruction technique. COMPARISON:  None Available. FINDINGS: Segmentation: 5 lumbar type vertebrae. Alignment: Normal. Vertebrae: No acute fracture or focal pathologic process. Right-sided L1 rudimentary rib. Paraspinal and other soft tissues: Negative. Disc levels: Mild degenerative disc height loss at L1-L2 and L5-S1. Posterior disc bulge at L5-S1. No high-grade foraminal narrowing. Other: Mild degenerative arthropathy of the left greater than right sacroiliac joints. No erosive changes. IMPRESSION: 1. No acute fracture or traumatic listhesis of the lumbar spine. 2. Mild degenerative disc height loss at L1-L2 and L5-S1. Posterior disc bulge at L5-S1. 3. 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 Electronically Signed   By: Harrietta Sherry M.D.   On: 08/17/2024 11:17   CT Renal Stone Study Result Date: 08/17/2024 CLINICAL DATA:  Abdominal and flank pain. Kidney stones suspected. Difficulty urinating. EXAM: CT ABDOMEN AND PELVIS WITHOUT CONTRAST TECHNIQUE: Multidetector CT imaging of the abdomen and pelvis was performed following the standard protocol without IV contrast. RADIATION DOSE REDUCTION: This exam was performed according to the  departmental dose-optimization program which includes automated exposure control, adjustment of the mA and/or kV according to patient size and/or use of iterative reconstruction technique. COMPARISON:  None Available. FINDINGS: Lower chest: No acute findings. Hepatobiliary: No suspicious focal abnormality in the liver on this study without intravenous contrast. There is no evidence for gallstones, gallbladder wall thickening, or pericholecystic fluid. No intrahepatic or extrahepatic biliary dilation. Pancreas: No focal mass lesion. No dilatation of the main duct. No intraparenchymal cyst. No peripancreatic edema. Spleen: No splenomegaly. No suspicious focal mass lesion. Adrenals/Urinary Tract: No adrenal nodule or mass. Kidneys unremarkable. No evidence for hydroureter. The urinary bladder appears normal for the degree of distention. Stomach/Bowel: Stomach is unremarkable. No gastric wall thickening. No evidence of outlet obstruction. Duodenum is normally positioned as is the ligament of Treitz. No small bowel wall thickening. No small bowel dilatation. The terminal ileum is normal. The appendix is normal. No gross colonic mass. No colonic wall thickening. Vascular/Lymphatic: No abdominal aortic aneurysm. No abdominal aortic atherosclerotic calcification. There is no gastrohepatic or hepatoduodenal ligament lymphadenopathy. No retroperitoneal or mesenteric lymphadenopathy. No pelvic sidewall lymphadenopathy. Reproductive: The prostate gland and seminal vesicles are unremarkable. Other: No intraperitoneal free fluid. Musculoskeletal: No worrisome lytic or sclerotic osseous abnormality. Please see report for dedicated lumbar spine CT dictated separately. IMPRESSION: No acute findings in the abdomen or pelvis. Specifically, no findings to explain the patient's history of abdominal and flank pain. No urinary stone disease or secondary changes in either kidney or ureter. Electronically Signed   By: Camellia Candle M.D.    On: 08/17/2024 11:08     Procedures   Medications Ordered in the ED  lidocaine  (LIDODERM ) 5 % 1 patch (1 patch Transdermal Patch Applied 08/17/24 1106)  acetaminophen  (TYLENOL ) tablet 1,000 mg (1,000 mg Oral Given 08/17/24 1105)  ketorolac  (TORADOL ) 15 MG/ML injection 15 mg (15 mg Intravenous Given 08/17/24 1103)    Clinical Course as of 08/17/24 1449  Sat Aug 17, 2024  1145 CT L-SPINE NO CHARGE IMPRESSION: 1. No acute fracture or traumatic listhesis of the lumbar spine. 2. Mild degenerative disc height loss at L1-L2 and L5-S1. Posterior disc bulge at L5-S1. 3. 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   Electronically Signed   By: Harrietta Sherry M.D.   On: 08/17/2024 11:17   [TY]  1146 CT Renal Stone Study IMPRESSION: No  acute findings in the abdomen or pelvis. Specifically, no findings to explain the patient's history of abdominal and flank pain. No urinary stone disease or secondary changes in either kidney or ureter.   Electronically Signed   By: Camellia Candle M.D.   On: 08/17/2024 11:08   [TY]  1234 CT L-SPINE NO CHARGE IMPRESSION: 1. No acute fracture or traumatic listhesis of the lumbar spine. 2. Mild degenerative disc height loss at L1-L2 and L5-S1. Posterior disc bulge at L5-S1. 3. 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   Electronically Signed   By: Harrietta Sherry M.D.   On: 08/17/2024 11:17   [TY]  1422 History of diabetes and has had prior instrumentation in cervical spine as well as been taking steroids for the past few days; concern for spinal epidural abscess.  He has no fever here and no Leukocytosis.  However, his sed rate and CRP are elevated.  No other overt infectious process to explain his reported fevers.  CT scans and chest x-ray without infectious process either.  Patient will need MRI to rule out spinal epidural abscess.  Do not  capability here at [facility].  Accepted to transfer to [facility] by POV by Dr. Carmelo.  Patient's wife will drive him.  Of note, he is feeling improved after Tylenol  and Toradol  and lidocaine  patch in terms of his back pain. [TY]    Clinical Course User Index [TY] Neysa Caron PARAS, DO                                 Medical Decision Making Is a 43 year old male presenting the emergency department with generalized malaise, fevers.  Has had fevers at home reportedly up to 102.  He took Tylenol  at 230 this morning.  Currently afebrile nontachycardic, normotensive.  Physical exam with no apparent weakness or motor deficits.  Will get broad lab workup.  Will get CT scan to evaluate for stone as he does note that pain is somewhat left-sided.  Will also get CT lumbar spine to evaluate for obvious osseous pathology.  Will get screening labs.  Given Tylenol , Toradol  and lidocaine  patch.  See ED course for further MDM disposition  Amount and/or Complexity of Data Reviewed Independent Historian:     Details: Wife notes daily fevers External Data Reviewed:     Details: Started on steroid taper on Tuesday by PCP Labs: ordered. Decision-making details documented in ED Course. Radiology: ordered and independent interpretation performed. Decision-making details documented in ED Course.  Risk OTC drugs. Prescription drug management. Decision regarding hospitalization. Diagnosis or treatment significantly limited by social determinants of health. Risk Details: Poor health literacy       Final diagnoses:  Acute midline low back pain without sciatica    ED Discharge Orders     None          Neysa Caron PARAS, DO 08/17/24 1449

## 2024-08-17 NOTE — ED Triage Notes (Signed)
 Been feeling unwell since 8/15. Tuesday started having fever, went to PCP and tested negative for flu/covid. Today c/o sweats, chills, fever, lumbar back pain, headache. Difficulty urinating.   Last tylenol  0230, taking methylprednisolone .

## 2024-08-17 NOTE — ED Notes (Addendum)
 Patient transported to MRI

## 2024-08-19 ENCOUNTER — Telehealth: Payer: Self-pay | Admitting: Internal Medicine

## 2024-08-19 NOTE — Telephone Encounter (Signed)
 Patient would like you to review his labs from the Hospital and advise him if abnormal

## 2024-08-20 NOTE — Telephone Encounter (Signed)
 Pt is coming in tomorrow to be seen.

## 2024-08-21 ENCOUNTER — Ambulatory Visit: Admitting: Medical

## 2024-08-21 VITALS — BP 122/58 | HR 102 | Temp 99.9°F | Wt 263.8 lb

## 2024-08-21 DIAGNOSIS — R6883 Chills (without fever): Secondary | ICD-10-CM

## 2024-08-21 DIAGNOSIS — R519 Headache, unspecified: Secondary | ICD-10-CM

## 2024-08-21 DIAGNOSIS — R509 Fever, unspecified: Secondary | ICD-10-CM

## 2024-08-21 DIAGNOSIS — N5082 Scrotal pain: Secondary | ICD-10-CM

## 2024-08-21 LAB — POCT URINALYSIS DIP (PROADVANTAGE DEVICE)
Bilirubin, UA: NEGATIVE
Blood, UA: NEGATIVE
Glucose, UA: >=1000 mg/dL — AB
Ketones, POC UA: NEGATIVE mg/dL
Leukocytes, UA: NEGATIVE
Nitrite, UA: NEGATIVE
Protein Ur, POC: NEGATIVE mg/dL
Specific Gravity, Urine: 1.005
Urobilinogen, Ur: NEGATIVE
pH, UA: 6 (ref 5.0–8.0)

## 2024-08-21 MED ORDER — DOXYCYCLINE HYCLATE 100 MG PO TABS: 100.0000 mg | ORAL_TABLET | Freq: Two times a day (BID) | ORAL | 0 refills | Status: DC

## 2024-08-21 NOTE — Patient Instructions (Addendum)
 Recommendations: Begin doxycycline  oral antibiotic twice daily for a week Hydrate well with clear fluids over the next few days You can use Tylenol  as needed for fever and not feeling well Hold off on your Farxiga  and enalapril  for the next 2 to 3 days until you feel much better If you still feel pretty lousy by Sunday then hold your Ozempic  shot for couple more days We will call with lab results If worse in the next 48 hours or other new symptoms let me know right away or get reevaluated at the emergency department If any new bloody diarrhea or diarrhea let me know

## 2024-08-21 NOTE — Progress Notes (Signed)
 Subjective:  Andre [name] is a 43 y.o. male who presents for Chief Complaint  Patient presents with   Acute Visit    Follow-up on back pain, fatigue, fever for the last 10 days. Has low grade this morning, sweating, no energy. Doesn't feel himself     Here for illness, back pain, fever, fatigue.  He was initially seen here with Dr. Vita on 08/13/2024.  Symptoms started somewhere around 816/2025 including fever, body aches, congestion, fever up to 102, some type of tender knots on his head.  He had some nausea.  He also had a recent steroid course for back pain, possible sciatica 07/25/24.  At the time he was felt to have a viral syndrome.  He was seen at the emergency department on 08/17/2024 for similar symptoms.  At that time he had mid line low back pain across and just above the sacrum, he had ongoing fevers,  No recent tick bites or recent travel.  No recent rash.  Currently has headache, photophobia, cold feeling, feverish at times, low back pain, pain down left left.   Fevers still in the 101-103 range. 103 on the day he went to the emergency dept.  Nauseated at times. Shivers at times.   +decreased appetite, but has eaten more today than past 2 weeks  No cough, no runny nose, no sore throat, no sneezing.    Urine stream is slow.   No burning with urination, no frequency, no blood in urine, no cloudy urine.  +pain in testicles.   Testicles feel a little swollen.  No penile discharge.  No diarrhea, no unusual stool colors  No neck pain or stiffness.    No recent tick bites or rash.    No recent sick contacts.    No exposure to homeless or others with possible tuberculosis.    Has girlfriend, x 2 years.  No other aggravating or relieving factors.    No other c/o.  Past Medical History:  Diagnosis Date   Bell's palsy    at 6th or 7th grade on right side    Depression    Diabetes mellitus without complication (HCC)    2017   Elevated LFTs 2017   Hypertension     Obesity    OSA (obstructive sleep apnea)    Current Outpatient Medications on File Prior to Visit  Medication Sig Dispense Refill   aspirin  EC 81 MG tablet Take 1 tablet (81 mg total) by mouth daily. 90 tablet 3   buPROPion  (WELLBUTRIN  XL) 150 MG 24 hr tablet Take 1 tablet (150 mg total) by mouth every morning. 90 tablet 2   enalapril  (VASOTEC ) 20 MG tablet Take 1 tablet (20 mg total) by mouth daily. 90 tablet 2   metFORMIN  (GLUCOPHAGE ) 500 MG tablet Take 1 tablet (500 mg total) by mouth daily with breakfast. 90 tablet 2   metoprolol  succinate (TOPROL  XL) 100 MG 24 hr tablet Take 1 tablet (100 mg total) by mouth daily. Take with or immediately following a meal. 90 tablet 2   rosuvastatin  (CRESTOR ) 20 MG tablet Take 1 tablet (20 mg total) by mouth daily. 90 tablet 2   Semaglutide , 2 MG/DOSE, (OZEMPIC , 2 MG/DOSE,) 8 MG/3ML SOPN DIAL AND INJECT UNDER THE SKIN 2 MG WEEKLY 3 mL 1   Blood Glucose Monitoring Suppl (ACCU-CHEK GUIDE) w/Device KIT USE TO TEST BLOOD GLUCOSE LEVELS 1-2 TIMES DAILY 1 kit 1   empagliflozin  (JARDIANCE ) 10 MG TABS tablet Take 1 tablet (10 mg total) by  mouth daily before breakfast. (Patient not taking: Reported on 08/21/2024) 90 tablet 2   Glucose Blood (BLOOD GLUCOSE TEST STRIPS) STRP Test 1-2 times daily 100 strip 0   Lancet Device MISC 1-2 times daily 1 each 0   Lancets Misc. MISC 1-2 times a day 100 each 0   MICROLET LANCETS MISC Test daily 100 each 11   tiZANidine  (ZANAFLEX ) 4 MG tablet Take 1 tablet (4 mg total) by mouth at bedtime as needed for muscle spasms. (Patient not taking: Reported on 08/21/2024) 12 tablet 0   No current facility-administered medications on file prior to visit.     The following portions of the patient's history were reviewed and updated as appropriate: allergies, current medications, past family history, past medical history, past social history, past surgical history and problem list.  ROS Otherwise as in subjective above  Objective: BP  (!) 122/58   Pulse (!) 102   Temp 99.9 F (37.7 C)   Wt 263 lb 12.8 oz (119.7 kg)   BMI 32.97 kg/m   BP Readings from Last 3 Encounters:  08/21/24 (!) 122/58  08/17/24 135/81  08/13/24 (!) 134/90    General appearance: alert, no distress, well developed, well nourished, ill-appearing Skin: Warm, moist, no obvious rash HEENT: normocephalic, sclerae anicteric, conjunctiva pink and moist, TMs pearly, nares patent, no discharge or erythema, pharynx normal Oral cavity: MMM, no lesions Neck: supple, no lymphadenopathy, no thyromegaly, no masses Heart: RRR, normal S1, S2, no murmurs Lungs: CTA bilaterally, no wheezes, rhonchi, or rales Abdomen: +bs, soft, mild left lower quadrant tenderness, otherwise non tender, non distended, no masses, no hepatomegaly, no splenomegaly GU: Tender and somewhat swollen to both testicles, penis unremarkable, no obvious lymphadenopathy Pulses: 2+ radial pulses, 2+ pedal pulses, normal cap refill Ext: no edema  CBC on 08/17/2024 showed normal white count, RDW was low, otherwise CBC normal [facility] metabolic panel on 08/17/2024 showed normal liver kidney electrolytes except blood sugar 211. 08/17/2024 sed rate slightly elevated at 30 08/17/2024 CRP elevated at 17.9 Urinalysis on 08/17/2024 showed ketones some protein, glucose over 500, trace hemoglobin.  Normal nitrites and leukocytes. Blood culture 08/17/2024 showed no growth at 4 days but still pending  Chest x-ray 08/17/2024 with no active disease Lumbar spine CT 08/17/2024 mild degenerative disc height loss, asymmetric degenerative arthropathy but no obvious abscess or infection  MRI thoracic spine 8/23/225 IMPRESSION: 1. At L5-S1, large left subarticular disc protrusion with impingement of the descending left S1 nerve root. 2. No evidence of discitis/osteomyelitis or epidural abscess.  MRI lumbar spine 08/17/2024 IMPRESSION: 1. At L5-S1, large left subarticular disc protrusion with impingement of the  descending left S1 nerve root. 2. No evidence of discitis/osteomyelitis or epidural abscess.  CT renal stone study 08/17/2024 IMPRESSION: No acute findings in the abdomen or pelvis. Specifically, no findings to explain the patient's history of abdominal and flank pain. No urinary stone disease or secondary changes in either kidney or ureter.    Assessment: Encounter Diagnoses  Name Primary?   Fever, unspecified fever cause Yes   Chills    Intractable headache, unspecified chronicity pattern, unspecified headache type    Acute pain in scrotum      Plan: I reviewed his emergency department visit notes, labs, scans, findings.  I noted these above under objective  We discussed differential.  He still has blood cultures pending but so far negative growth.  His CRP and sed rate marker were recently elevated but white counts were normal.  Given that several  things have already been ruled out, I suspect possible prostatitis versus orchitis versus other infection.  Begin doxycycline  antibiotic.  Discussed case with supervising physician Dr. Joyce.  No obvious need to send that to the emergency department for a lumbar puncture at this time although we discussed meningitis and other possible infection.    Kyron Schlitt was seen today for acute visit.  Diagnoses and all orders for this visit:  Fever, unspecified fever cause -     POCT Urinalysis DIP (Proadvantage Device) -     Chlamydia/Gonococcus/Trichomonas, NAA -     CBC with Differential/Platelet -     PSA -     HIV Antibody (routine testing w rflx)  Chills -     POCT Urinalysis DIP (Proadvantage Device) -     Chlamydia/Gonococcus/Trichomonas, NAA -     CBC with Differential/Platelet -     PSA -     HIV Antibody (routine testing w rflx)  Intractable headache, unspecified chronicity pattern, unspecified headache type -     POCT Urinalysis DIP (Proadvantage Device) -     Chlamydia/Gonococcus/Trichomonas, NAA -      CBC with Differential/Platelet -     PSA -     HIV Antibody (routine testing w rflx)  Acute pain in scrotum -     POCT Urinalysis DIP (Proadvantage Device) -     Chlamydia/Gonococcus/Trichomonas, NAA -     CBC with Differential/Platelet -     PSA -     HIV Antibody (routine testing w rflx)  Other orders -     doxycycline  (VIBRA -TABS) 100 MG tablet; Take 1 tablet (100 mg total) by mouth 2 (two) times daily.    Follow up: pending labs, emergency dept if much worse or not improving

## 2024-08-22 ENCOUNTER — Ambulatory Visit: Payer: Self-pay

## 2024-08-22 ENCOUNTER — Ambulatory Visit: Payer: Self-pay | Admitting: Medical

## 2024-08-22 ENCOUNTER — Emergency Department (HOSPITAL_BASED_OUTPATIENT_CLINIC_OR_DEPARTMENT_OTHER)
Admission: EM | Admit: 2024-08-22 | Discharge: 2024-08-22 | Disposition: A | Discharge status: Home / Self Care | Attending: Emergency Medicine | Admitting: Emergency Medicine

## 2024-08-22 ENCOUNTER — Other Ambulatory Visit: Payer: Self-pay

## 2024-08-22 ENCOUNTER — Encounter (HOSPITAL_BASED_OUTPATIENT_CLINIC_OR_DEPARTMENT_OTHER): Payer: Self-pay | Admitting: Emergency Medicine

## 2024-08-22 DIAGNOSIS — Z7984 Long term (current) use of oral hypoglycemic drugs: Secondary | ICD-10-CM | POA: Insufficient documentation

## 2024-08-22 DIAGNOSIS — I10 Essential (primary) hypertension: Secondary | ICD-10-CM | POA: Insufficient documentation

## 2024-08-22 DIAGNOSIS — E119 Type 2 diabetes mellitus without complications: Secondary | ICD-10-CM | POA: Insufficient documentation

## 2024-08-22 DIAGNOSIS — Z7982 Long term (current) use of aspirin: Secondary | ICD-10-CM | POA: Diagnosis not present

## 2024-08-22 DIAGNOSIS — M5442 Lumbago with sciatica, left side: Secondary | ICD-10-CM | POA: Diagnosis present

## 2024-08-22 DIAGNOSIS — Z79899 Other long term (current) drug therapy: Secondary | ICD-10-CM | POA: Diagnosis not present

## 2024-08-22 LAB — CBC WITH DIFFERENTIAL/PLATELET
Basophils Absolute: 0 x10E3/uL (ref 0.0–0.2)
Basos: 1 %
EOS (ABSOLUTE): 0.2 x10E3/uL (ref 0.0–0.4)
Eos: 3 %
Hematocrit: 47.9 % (ref 37.5–51.0)
Hemoglobin: 16.2 g/dL (ref 13.0–17.7)
Immature Grans (Abs): 0.1 x10E3/uL (ref 0.0–0.1)
Immature Granulocytes: 1 %
Lymphocytes Absolute: 0.6 x10E3/uL — ABNORMAL LOW (ref 0.7–3.1)
Lymphs: 7 %
MCH: 32.7 pg (ref 26.6–33.0)
MCHC: 33.8 g/dL (ref 31.5–35.7)
MCV: 97 fL (ref 79–97)
Monocytes Absolute: 0.9 x10E3/uL (ref 0.1–0.9)
Monocytes: 11 %
Neutrophils Absolute: 6.4 x10E3/uL (ref 1.4–7.0)
Neutrophils: 77 %
Platelets: 273 x10E3/uL (ref 150–450)
RBC: 4.95 x10E6/uL (ref 4.14–5.80)
RDW: 10.9 % — ABNORMAL LOW (ref 11.6–15.4)
WBC: 8.3 x10E3/uL (ref 3.4–10.8)

## 2024-08-22 LAB — PSA: Prostate Specific Ag, Serum: 0.3 ng/mL (ref 0.0–4.0)

## 2024-08-22 LAB — HIV ANTIBODY (ROUTINE TESTING W REFLEX): HIV Screen 4th Generation wRfx: NONREACTIVE

## 2024-08-22 MED ORDER — OXYCODONE-ACETAMINOPHEN 5-325 MG PO TABS
1.0000 | ORAL_TABLET | ORAL | Status: DC | PRN
  Administered 2024-08-22: 1 via ORAL
  Filled 2024-08-22: qty 1

## 2024-08-22 MED ORDER — CELECOXIB 200 MG PO CAPS: 200.0000 mg | ORAL_CAPSULE | Freq: Two times a day (BID) | ORAL | 0 refills | Status: DC | PRN

## 2024-08-22 MED ORDER — KETOROLAC TROMETHAMINE 15 MG/ML IJ SOLN
15.0000 mg | Freq: Once | INTRAMUSCULAR | Status: AC
  Administered 2024-08-22: 15 mg via INTRAVENOUS
  Filled 2024-08-22: qty 1

## 2024-08-22 MED ORDER — OXYCODONE HCL 5 MG PO TABS: 5.0000 mg | ORAL_TABLET | Freq: Four times a day (QID) | ORAL | 0 refills | Status: DC | PRN

## 2024-08-22 MED ORDER — METHYLPREDNISOLONE SODIUM SUCC 125 MG IJ SOLR
125.0000 mg | Freq: Once | INTRAMUSCULAR | Status: AC
  Administered 2024-08-22: 125 mg via INTRAVENOUS
  Filled 2024-08-22: qty 2

## 2024-08-22 MED ORDER — SODIUM CHLORIDE 0.9 % IV BOLUS
500.0000 mL | Freq: Once | INTRAVENOUS | Status: AC
Start: 2024-08-22 — End: 2024-08-22
  Administered 2024-08-22: 500 mL via INTRAVENOUS

## 2024-08-22 MED ORDER — SODIUM CHLORIDE 0.9 % IV BOLUS
1000.0000 mL | Freq: Once | INTRAVENOUS | Status: AC
  Administered 2024-08-22: 1000 mL via INTRAVENOUS

## 2024-08-22 NOTE — ED Provider Notes (Signed)
 [place] [facility] AT [facility] Provider Note   CSN: [id number] Arrival date & time: 08/22/24  1534     Patient presents with: Back Pain   Andre [name] is a 43 y.o. male.    Back Pain   43 year old male presents emergency department again with complaints of back pain.  States he has been having back pain for the past couple of months which is acutely worsened over the past couple of days more specifically today.  Was seen in the ED 5 days ago for similar complaint.  Had MRI as well as CT scans at that time.  MRI showed L5-S1 large left subarticular disc protrusion.  Patient has been trying to manage his symptoms at home with Tylenol , ibuprofen  without significant improvement.  States that now he is having difficulty with minimal movements.  Has a difficulty especially sitting.  Finds most comfort lying flat.  Denies any saddle anesthesia, fever,, bowel/bladder dysfunction , weakness/sensory deficits in lower extremities.  Does report pain rating down to his left foot.  Past medical history significant for diabetes mellitus type 2, hypertension, hyperlipidemia, Bell's paralysis  Prior to Admission medications   Medication Sig Start Date End Date Taking? Authorizing Provider  aspirin  EC 81 MG tablet Take 1 tablet (81 mg total) by mouth daily. 07/05/24   Tysinger, Alm RAMAN, PA-C  Blood Glucose Monitoring Suppl (ACCU-CHEK GUIDE) w/Device KIT USE TO TEST BLOOD GLUCOSE LEVELS 1-2 TIMES DAILY 06/03/24   Tysinger, Alm RAMAN, PA-C  buPROPion  (WELLBUTRIN  XL) 150 MG 24 hr tablet Take 1 tablet (150 mg total) by mouth every morning. 07/05/24   Tysinger, Alm RAMAN, PA-C  doxycycline  (VIBRA -TABS) 100 MG tablet Take 1 tablet (100 mg total) by mouth 2 (two) times daily. 08/21/24   Tysinger, Alm RAMAN, PA-C  empagliflozin  (JARDIANCE ) 10 MG TABS tablet Take 1 tablet (10 mg total) by mouth daily before breakfast. Patient not taking: Reported on 08/21/2024 07/05/24   Tysinger, Alm RAMAN, PA-C   enalapril  (VASOTEC ) 20 MG tablet Take 1 tablet (20 mg total) by mouth daily. 07/05/24   Tysinger, Alm RAMAN, PA-C  Glucose Blood (BLOOD GLUCOSE TEST STRIPS) STRP Test 1-2 times daily 04/04/24   Bulah Alm RAMAN, PA-C  Lancet Device MISC 1-2 times daily 04/04/24   Tysinger, Alm RAMAN, PA-C  Lancets Misc. MISC 1-2 times a day 04/04/24   Bulah Alm RAMAN, PA-C  metFORMIN  (GLUCOPHAGE ) 500 MG tablet Take 1 tablet (500 mg total) by mouth daily with breakfast. 07/05/24   Tysinger, Alm RAMAN, PA-C  metoprolol  succinate (TOPROL  XL) 100 MG 24 hr tablet Take 1 tablet (100 mg total) by mouth daily. Take with or immediately following a meal. 07/05/24 07/05/25  Tysinger, Alm RAMAN, PA-C  MICROLET LANCETS MISC Test daily 10/18/18   Tysinger, Alm RAMAN, PA-C  rosuvastatin  (CRESTOR ) 20 MG tablet Take 1 tablet (20 mg total) by mouth daily. 07/05/24   Tysinger, Alm RAMAN, PA-C  Semaglutide , 2 MG/DOSE, (OZEMPIC , 2 MG/DOSE,) 8 MG/3ML SOPN DIAL AND INJECT UNDER THE SKIN 2 MG WEEKLY 07/31/24   Tysinger, Alm RAMAN, PA-C  tiZANidine  (ZANAFLEX ) 4 MG tablet Take 1 tablet (4 mg total) by mouth at bedtime as needed for muscle spasms. Patient not taking: Reported on 08/21/2024 07/26/24 07/26/25  Bulah Alm RAMAN, PA-C    Allergies: Influenza vaccines, Janumet  [sitagliptin  phos-metformin  hcl], Morphine and codeine, Other, and Tramadol    Review of Systems  Musculoskeletal:  Positive for back pain.  All other systems reviewed and are negative.  Updated Vital Signs BP (!) 168/95   Pulse 88   Temp (!) 97.5 F (36.4 C)   Resp 16   Ht 6' 3 (1.905 m)   Wt 119.3 kg   SpO2 98%   BMI 32.87 kg/m   Physical Exam Vitals and nursing note reviewed.  Constitutional:      General: He is not in acute distress.    Appearance: He is well-developed.     Comments: Patient lying flat on bed.  Minimal movement worsens pain.  HENT:     Head: Normocephalic and atraumatic.  Eyes:     Conjunctiva/sclera: Conjunctivae normal.  Cardiovascular:     Rate  and Rhythm: Normal rate and regular rhythm.     Heart sounds: No murmur heard. Pulmonary:     Effort: Pulmonary effort is normal. No respiratory distress.     Breath sounds: Normal breath sounds.  Abdominal:     Palpations: Abdomen is soft.     Tenderness: There is no abdominal tenderness.  Musculoskeletal:        General: No swelling.     Cervical back: Neck supple.     Comments: Midline tenderness lower lumbar spine with paraspinal tenderness down the left side as well as left buttock.  Straight leg raise positive on the left.  Patient with symmetric strength ankle dorsi/plantarflexion but will not willingly move his hips or knees secondary to pain.  Pedal and posttibial tibial pulses 2+ bilaterally.  Skin:    General: Skin is warm and dry.     Capillary Refill: Capillary refill takes less than 2 seconds.  Neurological:     Mental Status: He is alert.  Psychiatric:        Mood and Affect: Mood normal.     (all labs ordered are listed, but only abnormal results are displayed) Labs Reviewed - No data to display  EKG: None  Radiology: No results found.   Procedures   Medications Ordered in the ED  oxyCODONE -acetaminophen  (PERCOCET/ROXICET) 5-325 MG per tablet 1 tablet (1 tablet Oral Given 08/22/24 1542)  methylPREDNISolone  sodium succinate (SOLU-MEDROL ) 125 mg/2 mL injection 125 mg (125 mg Intravenous Given 08/22/24 1637)  ketorolac  (TORADOL ) 15 MG/ML injection 15 mg (15 mg Intravenous Given 08/22/24 1637)  sodium chloride  0.9 % bolus 1,000 mL (1,000 mLs Intravenous New Bag/Given 08/22/24 1641)                                    Medical Decision Making Risk Prescription drug management.   This patient presents to the ED for concern of back pain, this involves an extensive number of treatment options, and is a complaint that carries with it a high risk of complications and morbidity.  The differential diagnosis includes fracture, strain stress pain, dislocation,  ligamentous/tendinous injury, neurovascular compromise, cauda equina, spinal epidural abscess, aortic dissection, other   Co morbidities that complicate the patient evaluation  See HPI   Additional history obtained:  Additional history obtained from EMR External records from outside source obtained and reviewed including hospital records   Lab Tests:  N/a   Imaging Studies ordered:  N/a   Cardiac Monitoring: / EKG:  N/a   Consultations Obtained:  I requested consultation with attending physician Dr. Ginger who is agreeable to treatment plan going forward   Problem List / ED Course / Critical interventions / Medication management  Low back pain I ordered medication including toradol , solumedrol,  percocet, normal saline    Reevaluation of the patient after these medicines showed that the patient improved I have reviewed the patients home medicines and have made adjustments as needed   Social Determinants of Health:  Cigarette use.  Denies illicit drug use.   Test / Admission - Considered:  Low back pain Vitals signs significant for hypertension blood pressure 160/95. Otherwise within normal range and stable throughout visit.  43 year old male presents emergency department again with complaints of back pain.  States he has been having back pain for the past couple of months which is acutely worsened over the past couple of days more specifically today.  Was seen in the ED 5 days ago for similar complaint.  Had MRI as well as CT scans at that time.  MRI showed L5-S1 large left subarticular disc protrusion.  Patient has been trying to manage his symptoms at home with Tylenol , ibuprofen  without significant improvement.  States that now he is having difficulty with minimal movements.  Has a difficulty especially sitting.  Finds most comfort lying flat.  Denies any saddle anesthesia, fever,, bowel/bladder dysfunction , weakness/sensory deficits in lower extremities.  Does  report pain rating down to his left foot. On exam, patient mainly with some midline tenderness but mainly paraspinal tenderness noted in the left lower lumbar region into left buttock.  She leg raise on the left was positive.  No obvious acute weakness or sensory deficits bilateral lower extremities.  Intact pulses.  Patient did have pretty extensive workup 5 days ago including CT imaging of abdomen/pelvis, lumbar spine, MR imaging of the lumbar spine as well as thoracic spine with L5/S1 large left subarticular disc protrusion found.  Given no red flag signs or back pain, recent imaging as above, no emergent repeat imaging deemed necessary at this time.  Patient's pain better controlled in the ED with medications administered.  Will send home patient with medication for pain and have him follow-up with primary care/neurosurgery for reassessment.  Treatment plan discussed with patient and he acknowledged understanding was agreeable to said plan.  Patient well-appearing, afebrile in no acute distress upon discharge. Worrisome signs and symptoms were discussed with the patient, and the patient acknowledged understanding to return to the ED if noticed. Patient was stable upon discharge.       Final diagnoses:  None    ED Discharge Orders     None          Silver Wonda LABOR, [place] 08/22/24 1805    Tegeler, Lonni PARAS, MD 08/22/24 2036

## 2024-08-22 NOTE — Progress Notes (Signed)
 Results to MyChart

## 2024-08-22 NOTE — Telephone Encounter (Signed)
 FYI Only or Action Required?: Action required by provider: Refusing ED, requesting call back with further recommendations/referral asap.  Patient was last seen in primary care on 08/21/2024 by Bulah Alm RAMAN, PA-C.  Called Nurse Triage reporting Back Pain, Difficulty Walking, difficulty sitting and laying down, and Weakness.  Symptoms began several weeks ago - turned severe today.  Interventions attempted: OTC medications: advil , tylenol , Prescription medications: muscle relaxers, steroids, Rest, hydration, or home remedies, and Other: chiropractic appt today - loved one thinks made pain worse.  Symptoms are: rapidly worsening.  Triage Disposition: Go to ED Now (or PCP Triage)  Patient/caregiver understands and will follow disposition?: No, refuses disposition       Copied from CRM [id number]. Topic: Clinical - Red Word Triage >> Aug 22, 2024  2:12 PM Jasmin G wrote: [facility] that prompted transfer to Nurse Triage: Pt was seen by PCP, Dr. Bulah yesterday, pt called due to experiencing unbearable back pain, he can't walk, sit or lay down to the pain and wanted to see if he could come in to get a cortisol shot to be make the pain more bearable. Reason for Disposition  Weakness of a leg or foot (e.g., unable to bear weight, dragging foot)    Yes very difficult to walk per loved one  Answer Assessment - Initial Assessment Questions Ricki Hall on phone for pt, had to leave him at chiropractor to pick up our son, just wanted to know if could get him in to see somebody, chiropractor can't relieve it  1. ONSET: When did the pain begin? (e.g., minutes, hours, days)     2 weeks ago 2. LOCATION: Where does it hurt? 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 SEVERITY: How bad is the pain?  (e.g., Scale 1-10; mild, moderate, or severe)     Been high but hit severe today, unbearable, unable to walk, sit, or lay down due to pain, think protrusion  affecting nerves, shooting down leg all the way to foot 4. PATTERN: Is the pain constant? (e.g., yes, no; constant, intermittent)      constant 5. RADIATION: Does the pain shoot into your legs or somewhere else?     Down leg into foot 7. BACK OVERUSE:  Any recent lifting of heavy objects, strenuous work or exercise?     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 MEDICINES: What have you taken so far for the pain? (e.g., nothing, acetaminophen , NSAIDS)     Advil  and tylenol , can't take heavy stuff, think Dr. Bulah gave him steroids and muscle relaxers last week just give him the jitters doesn't do anything 9. NEUROLOGIC SYMPTOMS: Do you have any weakness, numbness, or problems with bowel/bladder control?     Numbness/tingling no more than usual, pain is making him very weak, can't move his legs very well 10. OTHER SYMPTOMS: Do you have any other symptoms? (e.g., fever, abdomen pain, burning with urination, blood in urine)       Just saw Dr. Bulah yesterday, got diagnosed with bacterial infection related to prostate, intermittent fever for past 2 weeks, put on antibx yesterday, no burning or blood with urination   ED went to on Saturday said nothing can do, need to see ortho or back specialist, can't deal with it now, need Tysinger to do referral or something to get ball  rolling Not had cortisol shot before   Advised ED, pt loved one refusing at this time since didn't help when went recently, loved one requesting referral and/or further recommendations in meantime for pt. Advised ED especially if any worsening of symptoms. Sending message to PCP for call back.  Protocols used: Back Pain-A-AH

## 2024-08-22 NOTE — ED Triage Notes (Signed)
 Pt reports back pain x 2 months, worse x 2 weeks, severe today.    Denies known injury, urinary sx.   Took ibuprofen  appx 1100.

## 2024-08-22 NOTE — Telephone Encounter (Signed)
 Pt at ED right now

## 2024-08-22 NOTE — Discharge Instructions (Addendum)
 We will send a referral into neurosurgery to be reevaluated.  Will send in medicine for pain/inflammation called Celebrex  as well as additionally pain medication.  Recommend gentle stretching/rehab exercising at home as able.

## 2024-08-22 NOTE — ED Notes (Addendum)
 Pt able to sit up, stand ans transfer to transport chair with assist. Reviewed discharge instructions , follow up and medication. States understanding. Assisted to vehicle

## 2024-08-23 ENCOUNTER — Telehealth: Payer: Self-pay | Admitting: Internal Medicine

## 2024-08-23 DIAGNOSIS — R937 Abnormal findings on diagnostic imaging of other parts of musculoskeletal system: Secondary | ICD-10-CM

## 2024-08-23 DIAGNOSIS — R200 Anesthesia of skin: Secondary | ICD-10-CM

## 2024-08-23 DIAGNOSIS — R29898 Other symptoms and signs involving the musculoskeletal system: Secondary | ICD-10-CM

## 2024-08-23 LAB — CULTURE, BLOOD (ROUTINE X 2)
Culture: NO GROWTH
Culture: NO GROWTH

## 2024-08-23 NOTE — Telephone Encounter (Signed)
 Patient called me last night after he got out of the Hospital. He is still in extreme pain and was told to follow-up with back specialist. I spoke to [place] about this yesterday and shane oked for me to place an urgent referral to Ortho.

## 2024-08-23 NOTE — Telephone Encounter (Signed)
 Spoke with patient and Dr. Vita. Dr. Vita recommends Epidural of lumbar spine. I have placed referral for epidural and ortho as stat. They will contact patient .    Patient will take celebrex  and see how he does on this.

## 2024-08-24 LAB — CHLAMYDIA/GONOCOCCUS/TRICHOMONAS, NAA
Chlamydia by NAA: NEGATIVE
Gonococcus by NAA: NEGATIVE
Trich vag by NAA: NEGATIVE

## 2024-08-25 ENCOUNTER — Other Ambulatory Visit: Payer: Self-pay

## 2024-08-25 ENCOUNTER — Encounter (HOSPITAL_COMMUNITY): Payer: Self-pay

## 2024-08-25 ENCOUNTER — Emergency Department (HOSPITAL_COMMUNITY)

## 2024-08-25 ENCOUNTER — Inpatient Hospital Stay (HOSPITAL_COMMUNITY)
Admission: EM | Admit: 2024-08-25 | Discharge: 2024-08-28 | DRG: 519 | Disposition: A | Discharge status: Home / Self Care | Attending: Internal Medicine | Admitting: Internal Medicine

## 2024-08-25 DIAGNOSIS — E860 Dehydration: Secondary | ICD-10-CM | POA: Diagnosis present

## 2024-08-25 DIAGNOSIS — Z8249 Family history of ischemic heart disease and other diseases of the circulatory system: Secondary | ICD-10-CM

## 2024-08-25 DIAGNOSIS — Z6831 Body mass index (BMI) 31.0-31.9, adult: Secondary | ICD-10-CM

## 2024-08-25 DIAGNOSIS — Z888 Allergy status to other drugs, medicaments and biological substances status: Secondary | ICD-10-CM

## 2024-08-25 DIAGNOSIS — E785 Hyperlipidemia, unspecified: Secondary | ICD-10-CM | POA: Diagnosis not present

## 2024-08-25 DIAGNOSIS — Z79899 Other long term (current) drug therapy: Secondary | ICD-10-CM

## 2024-08-25 DIAGNOSIS — F32A Depression, unspecified: Secondary | ICD-10-CM | POA: Diagnosis present

## 2024-08-25 DIAGNOSIS — Z7985 Long-term (current) use of injectable non-insulin antidiabetic drugs: Secondary | ICD-10-CM

## 2024-08-25 DIAGNOSIS — M549 Dorsalgia, unspecified: Secondary | ICD-10-CM | POA: Diagnosis present

## 2024-08-25 DIAGNOSIS — G4733 Obstructive sleep apnea (adult) (pediatric): Secondary | ICD-10-CM | POA: Diagnosis present

## 2024-08-25 DIAGNOSIS — R202 Paresthesia of skin: Secondary | ICD-10-CM

## 2024-08-25 DIAGNOSIS — G510 Bell's palsy: Secondary | ICD-10-CM | POA: Diagnosis present

## 2024-08-25 DIAGNOSIS — Z7982 Long term (current) use of aspirin: Secondary | ICD-10-CM

## 2024-08-25 DIAGNOSIS — M4802 Spinal stenosis, cervical region: Secondary | ICD-10-CM | POA: Diagnosis present

## 2024-08-25 DIAGNOSIS — E118 Type 2 diabetes mellitus with unspecified complications: Secondary | ICD-10-CM | POA: Diagnosis present

## 2024-08-25 DIAGNOSIS — Z833 Family history of diabetes mellitus: Secondary | ICD-10-CM

## 2024-08-25 DIAGNOSIS — N179 Acute kidney failure, unspecified: Secondary | ICD-10-CM | POA: Diagnosis not present

## 2024-08-25 DIAGNOSIS — E1159 Type 2 diabetes mellitus with other circulatory complications: Secondary | ICD-10-CM | POA: Diagnosis not present

## 2024-08-25 DIAGNOSIS — Z981 Arthrodesis status: Secondary | ICD-10-CM

## 2024-08-25 DIAGNOSIS — R32 Unspecified urinary incontinence: Secondary | ICD-10-CM | POA: Diagnosis present

## 2024-08-25 DIAGNOSIS — E1165 Type 2 diabetes mellitus with hyperglycemia: Secondary | ICD-10-CM | POA: Diagnosis present

## 2024-08-25 DIAGNOSIS — Z887 Allergy status to serum and vaccine status: Secondary | ICD-10-CM

## 2024-08-25 DIAGNOSIS — R52 Pain, unspecified: Secondary | ICD-10-CM

## 2024-08-25 DIAGNOSIS — I152 Hypertension secondary to endocrine disorders: Secondary | ICD-10-CM | POA: Diagnosis present

## 2024-08-25 DIAGNOSIS — Z7984 Long term (current) use of oral hypoglycemic drugs: Secondary | ICD-10-CM

## 2024-08-25 DIAGNOSIS — Z885 Allergy status to narcotic agent status: Secondary | ICD-10-CM

## 2024-08-25 DIAGNOSIS — M5417 Radiculopathy, lumbosacral region: Principal | ICD-10-CM

## 2024-08-25 DIAGNOSIS — M5442 Lumbago with sciatica, left side: Secondary | ICD-10-CM

## 2024-08-25 DIAGNOSIS — M48061 Spinal stenosis, lumbar region without neurogenic claudication: Principal | ICD-10-CM | POA: Diagnosis present

## 2024-08-25 DIAGNOSIS — N419 Inflammatory disease of prostate, unspecified: Secondary | ICD-10-CM | POA: Diagnosis present

## 2024-08-25 DIAGNOSIS — N990 Postprocedural (acute) (chronic) kidney failure: Secondary | ICD-10-CM | POA: Diagnosis not present

## 2024-08-25 DIAGNOSIS — W501XXA Accidental kick by another person, initial encounter: Secondary | ICD-10-CM | POA: Diagnosis present

## 2024-08-25 DIAGNOSIS — E66811 Obesity, class 1: Secondary | ICD-10-CM | POA: Diagnosis present

## 2024-08-25 DIAGNOSIS — G9611 Dural tear: Secondary | ICD-10-CM | POA: Diagnosis present

## 2024-08-25 DIAGNOSIS — G8929 Other chronic pain: Secondary | ICD-10-CM | POA: Diagnosis present

## 2024-08-25 DIAGNOSIS — G839 Paralytic syndrome, unspecified: Secondary | ICD-10-CM | POA: Diagnosis present

## 2024-08-25 DIAGNOSIS — M2578 Osteophyte, vertebrae: Secondary | ICD-10-CM | POA: Diagnosis present

## 2024-08-25 DIAGNOSIS — Z841 Family history of disorders of kidney and ureter: Secondary | ICD-10-CM

## 2024-08-25 DIAGNOSIS — M5117 Intervertebral disc disorders with radiculopathy, lumbosacral region: Secondary | ICD-10-CM | POA: Diagnosis present

## 2024-08-25 DIAGNOSIS — F17210 Nicotine dependence, cigarettes, uncomplicated: Secondary | ICD-10-CM | POA: Diagnosis present

## 2024-08-25 LAB — CBC WITH DIFFERENTIAL/PLATELET
Abs Immature Granulocytes: 0.15 K/uL — ABNORMAL HIGH (ref 0.00–0.07)
Basophils Absolute: 0 K/uL (ref 0.0–0.1)
Basophils Relative: 0 %
Eosinophils Absolute: 0.2 K/uL (ref 0.0–0.5)
Eosinophils Relative: 2 %
HCT: 43.6 % (ref 39.0–52.0)
Hemoglobin: 14.5 g/dL (ref 13.0–17.0)
Immature Granulocytes: 2 %
Lymphocytes Relative: 12 %
Lymphs Abs: 1.1 K/uL (ref 0.7–4.0)
MCH: 31.3 pg (ref 26.0–34.0)
MCHC: 33.3 g/dL (ref 30.0–36.0)
MCV: 94 fL (ref 80.0–100.0)
Monocytes Absolute: 1 K/uL (ref 0.1–1.0)
Monocytes Relative: 11 %
Neutro Abs: 6.8 K/uL (ref 1.7–7.7)
Neutrophils Relative %: 73 %
Platelets: 326 K/uL (ref 150–400)
RBC: 4.64 MIL/uL (ref 4.22–5.81)
RDW: 10.6 % — ABNORMAL LOW (ref 11.5–15.5)
WBC: 9.3 K/uL (ref 4.0–10.5)
nRBC: 0 % (ref 0.0–0.2)

## 2024-08-25 LAB — COMPREHENSIVE METABOLIC PANEL WITH GFR
ALT: 31 U/L (ref 0–44)
AST: 13 U/L — ABNORMAL LOW (ref 15–41)
Albumin: 2.6 g/dL — ABNORMAL LOW (ref 3.5–5.0)
Alkaline Phosphatase: 70 U/L (ref 38–126)
Anion gap: 10 (ref 5–15)
BUN: 11 mg/dL (ref 6–20)
CO2: 26 mmol/L (ref 22–32)
Calcium: 8.5 mg/dL — ABNORMAL LOW (ref 8.9–10.3)
Chloride: 98 mmol/L (ref 98–111)
Creatinine, Ser: 0.91 mg/dL (ref 0.61–1.24)
GFR, Estimated: >60 mL/min (ref >60)
Glucose, Bld: 269 mg/dL — ABNORMAL HIGH (ref 70–99)
Potassium: 4.2 mmol/L (ref 3.5–5.1)
Sodium: 134 mmol/L — ABNORMAL LOW (ref 135–145)
Total Bilirubin: 0.8 mg/dL (ref 0.0–1.2)
Total Protein: 6 g/dL — ABNORMAL LOW (ref 6.5–8.1)

## 2024-08-25 MED ORDER — METOPROLOL SUCCINATE ER 100 MG PO TB24
100.0000 mg | ORAL_TABLET | Freq: Every day | ORAL | Status: DC
  Administered 2024-08-26 – 2024-08-28 (×3): 100 mg via ORAL
  Filled 2024-08-25 (×3): qty 1

## 2024-08-25 MED ORDER — HYDROMORPHONE HCL 1 MG/ML IJ SOLN
0.5000 mg | INTRAMUSCULAR | Status: DC | PRN
  Administered 2024-08-26 – 2024-08-27 (×10): 1 mg via INTRAVENOUS
  Filled 2024-08-25 (×10): qty 1

## 2024-08-25 MED ORDER — ROSUVASTATIN CALCIUM 20 MG PO TABS
20.0000 mg | ORAL_TABLET | Freq: Every day | ORAL | Status: DC
Start: 2024-08-26 — End: 2024-08-28
  Administered 2024-08-26 – 2024-08-27 (×2): 20 mg via ORAL
  Filled 2024-08-25 (×4): qty 1

## 2024-08-25 MED ORDER — HYDROMORPHONE HCL 1 MG/ML IJ SOLN
0.5000 mg | INTRAMUSCULAR | Status: DC | PRN
  Administered 2024-08-25 (×2): 0.5 mg via INTRAVENOUS
  Filled 2024-08-25: qty 0.5
  Filled 2024-08-25: qty 1

## 2024-08-25 MED ORDER — ACETAMINOPHEN 650 MG RE SUPP: 650.0000 mg | Freq: Four times a day (QID) | RECTAL | Status: DC | PRN

## 2024-08-25 MED ORDER — ACETAMINOPHEN 325 MG PO TABS
650.0000 mg | ORAL_TABLET | Freq: Four times a day (QID) | ORAL | Status: DC | PRN
  Administered 2024-08-26 – 2024-08-28 (×4): 650 mg via ORAL
  Filled 2024-08-25 (×4): qty 2

## 2024-08-25 MED ORDER — OXYCODONE HCL 5 MG PO TABS
5.0000 mg | ORAL_TABLET | Freq: Four times a day (QID) | ORAL | Status: DC | PRN
  Administered 2024-08-25 – 2024-08-27 (×7): 5 mg via ORAL
  Filled 2024-08-25 (×7): qty 1

## 2024-08-25 MED ORDER — METHOCARBAMOL 1000 MG/10ML IJ SOLN
500.0000 mg | Freq: Once | INTRAMUSCULAR | Status: AC
  Administered 2024-08-25: 500 mg via INTRAVENOUS
  Filled 2024-08-25: qty 10

## 2024-08-25 MED ORDER — HYDROMORPHONE HCL 1 MG/ML IJ SOLN
0.5000 mg | Freq: Once | INTRAMUSCULAR | Status: AC
  Administered 2024-08-25: 0.5 mg via INTRAVENOUS
  Filled 2024-08-25: qty 1

## 2024-08-25 MED ORDER — CELECOXIB 200 MG PO CAPS
200.0000 mg | ORAL_CAPSULE | Freq: Two times a day (BID) | ORAL | Status: DC | PRN
  Administered 2024-08-26 (×2): 200 mg via ORAL
  Filled 2024-08-25 (×3): qty 1

## 2024-08-25 MED ORDER — GADOBUTROL 1 MMOL/ML IV SOLN
10.0000 mL | Freq: Once | INTRAVENOUS | Status: AC | PRN
  Administered 2024-08-25: 10 mL via INTRAVENOUS

## 2024-08-25 MED ORDER — BUPROPION HCL ER (XL) 150 MG PO TB24
150.0000 mg | ORAL_TABLET | Freq: Every morning | ORAL | Status: DC
  Administered 2024-08-26: 150 mg via ORAL
  Filled 2024-08-25 (×3): qty 1

## 2024-08-25 MED ORDER — SODIUM CHLORIDE 0.9% FLUSH
3.0000 mL | Freq: Two times a day (BID) | INTRAVENOUS | Status: DC
  Administered 2024-08-25 – 2024-08-28 (×6): 3 mL via INTRAVENOUS

## 2024-08-25 MED ORDER — ENOXAPARIN SODIUM 40 MG/0.4ML IJ SOSY
40.0000 mg | PREFILLED_SYRINGE | INTRAMUSCULAR | Status: DC
  Administered 2024-08-26: 40 mg via SUBCUTANEOUS
  Filled 2024-08-25: qty 0.4

## 2024-08-25 MED ORDER — SODIUM CHLORIDE 0.9 % IV BOLUS
1000.0000 mL | Freq: Once | INTRAVENOUS | Status: AC
  Administered 2024-08-25: 1000 mL via INTRAVENOUS

## 2024-08-25 MED ORDER — DOXYCYCLINE HYCLATE 100 MG PO TABS
100.0000 mg | ORAL_TABLET | Freq: Two times a day (BID) | ORAL | Status: DC
  Administered 2024-08-25 – 2024-08-26 (×3): 100 mg via ORAL
  Filled 2024-08-25 (×4): qty 1

## 2024-08-25 MED ORDER — ENALAPRIL MALEATE 10 MG PO TABS
20.0000 mg | ORAL_TABLET | Freq: Every day | ORAL | Status: DC
  Administered 2024-08-26 – 2024-08-27 (×2): 20 mg via ORAL
  Filled 2024-08-25 (×3): qty 2

## 2024-08-25 MED ORDER — POLYETHYLENE GLYCOL 3350 17 G PO PACK: 17.0000 g | PACK | Freq: Every day | ORAL | Status: DC | PRN

## 2024-08-25 NOTE — Consult Note (Signed)
 Neurosurgery Consult Note  Assessment:  43 y/o M w/ hx DM2 (A1c 7.1), smoking who presents with 3 days of severe L S1 radiculopathy. MRI shows left lateral recess L5-S1 disc herniation   Plan:  Recommend conservative measures, hot cold packs, pain control, muscle relaxants, steroids, therapy AAT DAT Ok for DVT chemoppx Rest of cares per primary    CC: left leg pain  HPI:     Patient is a 43 y.o. male w/ hx smoking, DM2 (A1c 7.1), previous C5-7 ACDF who has a couple month hx of LBP being treated with chiropractor. 3 days ago, he developed acute severe LLE radiculopathy traveling from the back into the foot. He also complains of LLE weakness. He can still urinate although it burns. He has not been able to get an erection in the past 3 days. Of note, he presented to the ED 1 week ago for complaints of fever and was eventually diagnosed with prostatitis. He is currently on antibiotics for this with 2 days remaining  MRI Lspine shows left L5-S1 disc herniation with compression of the S1 nerve root. No frank central stenosis. MRI Tspine from last week is unremarkable. MRI Cspine shows previous C5-7 ACDF and mild adjacent segment disease without central stenosis. 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   Type 2 diabetes with complication (HCC) 09/09/2015   Hypertension associated with diabetes (HCC) 02/04/2013   Past Medical History:  Diagnosis Date   Bell's palsy    at 6th or 7th grade on right side    Depression    Diabetes mellitus without complication (HCC)    2017   Elevated LFTs 2017   Hypertension    Obesity     OSA (obstructive sleep apnea)     Past Surgical History:  Procedure Laterality Date   CERVICAL FUSION     SKIN GRAFT Right    below right knee, medial     (Not in a hospital admission)  Allergies  Allergen Reactions   Other Other (See Comments)    Rybelsus  - constipation, hemorrhoids   Influenza Vaccines Other (See Comments)    Illness x last 3 vaccines   Tramadol    Janumet  [Sitagliptin  Phos-Metformin  Hcl] Nausea And Vomiting   Morphine  And Codeine Diarrhea and Nausea And Vomiting    Social History   Tobacco Use   Smoking status: Every Day    Current packs/day: 2.00    Types: Cigarettes   Smokeless tobacco: Never  Substance Use Topics   Alcohol use: No    Family History  Problem Relation Age of Onset   Heart disease Mother 60   Diabetes Mother    Diabetes Father        IDDM, amputation   Kidney disease Father        kidney transplant   Cancer Father    Heart disease Sister        congenital     Review of Systems Pertinent items are noted in HPI.  Objective:   Patient Vitals for the past 8 hrs:  BP Temp Temp src Pulse Resp SpO2 Height Weight  08/25/24 1706 (!) 158/94 -- --  89 18 98 % -- --  08/25/24 1705 (!) 158/94 98.6 F (37 C) Oral 87 -- 98 % -- --  08/25/24 1348 -- -- -- -- -- -- 6' 3 (1.905 m) 119.3 kg  08/25/24 1347 -- -- -- -- -- 100 % -- --  08/25/24 1343 (!) 167/95 98.2 F (36.8 C) Oral 88 15 100 % -- --   No intake/output data recorded. No intake/output data recorded.    Exam: RUE: 5/5 deltoid, 5/5 bicep, 5/5 wrist extension, 5/5 tricep, 5/5 grip LUE: 5/5 deltoid, 5/5 bicep, 5/5 wrist extension, 5/5 tricep, 5/5 grip RLE: 5/5 IP, 5/5 quad, 5/5 ham, 5/5 DF, 5/5 EHL, 5/5 PF LLE: 5/5 IP, 5/5 quad, 5/5 ham. Poor effort, but when prompted gives 4/5 strength DF, EHL, and PF Subjective numbness of the entire left foot No Hoffman or Babinski    Data ReviewCBC:  Lab Results  Component Value Date   WBC 9.3 08/25/2024   RBC 4.64  08/25/2024   BMP:  Lab Results  Component Value Date   GLUCOSE 269 (H) 08/25/2024   CO2 26 08/25/2024   BUN 11 08/25/2024   BUN 12 07/05/2024   CREATININE 0.91 08/25/2024   CREATININE 1.01 10/28/2016   CALCIUM  8.5 (L) 08/25/2024
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
## 2024-08-25 NOTE — ED Triage Notes (Signed)
 Pt. Arrived POV; C/O back pain that just recently started; pt. Went to urgent care and got oxycodone  and NSAIDs but states that they did not touch his pain; Pt. Reports pain as a 10/10 but has no other complaints.

## 2024-08-25 NOTE — Progress Notes (Signed)
 Results thru my chart

## 2024-08-26 DIAGNOSIS — F32A Depression, unspecified: Secondary | ICD-10-CM | POA: Diagnosis present

## 2024-08-26 DIAGNOSIS — G9611 Dural tear: Secondary | ICD-10-CM | POA: Diagnosis present

## 2024-08-26 DIAGNOSIS — G4733 Obstructive sleep apnea (adult) (pediatric): Secondary | ICD-10-CM | POA: Diagnosis present

## 2024-08-26 DIAGNOSIS — E1159 Type 2 diabetes mellitus with other circulatory complications: Secondary | ICD-10-CM | POA: Diagnosis present

## 2024-08-26 DIAGNOSIS — E1165 Type 2 diabetes mellitus with hyperglycemia: Secondary | ICD-10-CM | POA: Diagnosis present

## 2024-08-26 DIAGNOSIS — I152 Hypertension secondary to endocrine disorders: Secondary | ICD-10-CM | POA: Diagnosis present

## 2024-08-26 DIAGNOSIS — M5117 Intervertebral disc disorders with radiculopathy, lumbosacral region: Secondary | ICD-10-CM | POA: Diagnosis present

## 2024-08-26 DIAGNOSIS — Z79899 Other long term (current) drug therapy: Secondary | ICD-10-CM | POA: Diagnosis not present

## 2024-08-26 DIAGNOSIS — W501XXA Accidental kick by another person, initial encounter: Secondary | ICD-10-CM | POA: Diagnosis present

## 2024-08-26 DIAGNOSIS — Z6831 Body mass index (BMI) 31.0-31.9, adult: Secondary | ICD-10-CM | POA: Diagnosis not present

## 2024-08-26 DIAGNOSIS — Z8249 Family history of ischemic heart disease and other diseases of the circulatory system: Secondary | ICD-10-CM | POA: Diagnosis not present

## 2024-08-26 DIAGNOSIS — N179 Acute kidney failure, unspecified: Secondary | ICD-10-CM | POA: Diagnosis not present

## 2024-08-26 DIAGNOSIS — E785 Hyperlipidemia, unspecified: Secondary | ICD-10-CM | POA: Diagnosis present

## 2024-08-26 DIAGNOSIS — G839 Paralytic syndrome, unspecified: Secondary | ICD-10-CM | POA: Diagnosis present

## 2024-08-26 DIAGNOSIS — M549 Dorsalgia, unspecified: Secondary | ICD-10-CM | POA: Diagnosis not present

## 2024-08-26 DIAGNOSIS — N990 Postprocedural (acute) (chronic) kidney failure: Secondary | ICD-10-CM | POA: Diagnosis not present

## 2024-08-26 DIAGNOSIS — M48061 Spinal stenosis, lumbar region without neurogenic claudication: Secondary | ICD-10-CM | POA: Diagnosis present

## 2024-08-26 DIAGNOSIS — M2578 Osteophyte, vertebrae: Secondary | ICD-10-CM | POA: Diagnosis present

## 2024-08-26 DIAGNOSIS — R32 Unspecified urinary incontinence: Secondary | ICD-10-CM | POA: Diagnosis present

## 2024-08-26 DIAGNOSIS — F17210 Nicotine dependence, cigarettes, uncomplicated: Secondary | ICD-10-CM | POA: Diagnosis present

## 2024-08-26 DIAGNOSIS — M4802 Spinal stenosis, cervical region: Secondary | ICD-10-CM | POA: Diagnosis present

## 2024-08-26 DIAGNOSIS — N419 Inflammatory disease of prostate, unspecified: Secondary | ICD-10-CM | POA: Diagnosis present

## 2024-08-26 DIAGNOSIS — E66811 Obesity, class 1: Secondary | ICD-10-CM | POA: Diagnosis present

## 2024-08-26 DIAGNOSIS — M5116 Intervertebral disc disorders with radiculopathy, lumbar region: Secondary | ICD-10-CM | POA: Diagnosis not present

## 2024-08-26 DIAGNOSIS — Z7984 Long term (current) use of oral hypoglycemic drugs: Secondary | ICD-10-CM | POA: Diagnosis not present

## 2024-08-26 DIAGNOSIS — Z833 Family history of diabetes mellitus: Secondary | ICD-10-CM | POA: Diagnosis not present

## 2024-08-26 DIAGNOSIS — E860 Dehydration: Secondary | ICD-10-CM | POA: Diagnosis present

## 2024-08-26 LAB — COMPREHENSIVE METABOLIC PANEL WITH GFR
ALT: 24 U/L (ref 0–44)
AST: 10 U/L — ABNORMAL LOW (ref 15–41)
Albumin: 2.5 g/dL — ABNORMAL LOW (ref 3.5–5.0)
Alkaline Phosphatase: 64 U/L (ref 38–126)
Anion gap: 12 (ref 5–15)
BUN: 14 mg/dL (ref 6–20)
CO2: 26 mmol/L (ref 22–32)
Calcium: 8.8 mg/dL — ABNORMAL LOW (ref 8.9–10.3)
Chloride: 99 mmol/L (ref 98–111)
Creatinine, Ser: 0.94 mg/dL (ref 0.61–1.24)
GFR, Estimated: >60 mL/min (ref >60)
Glucose, Bld: 239 mg/dL — ABNORMAL HIGH (ref 70–99)
Potassium: 4.2 mmol/L (ref 3.5–5.1)
Sodium: 137 mmol/L (ref 135–145)
Total Bilirubin: 1 mg/dL (ref 0.0–1.2)
Total Protein: 5.6 g/dL — ABNORMAL LOW (ref 6.5–8.1)

## 2024-08-26 LAB — CBC
HCT: 42.1 % (ref 39.0–52.0)
Hemoglobin: 14.2 g/dL (ref 13.0–17.0)
MCH: 31.3 pg (ref 26.0–34.0)
MCHC: 33.7 g/dL (ref 30.0–36.0)
MCV: 92.7 fL (ref 80.0–100.0)
Platelets: 322 K/uL (ref 150–400)
RBC: 4.54 MIL/uL (ref 4.22–5.81)
RDW: 10.7 % — ABNORMAL LOW (ref 11.5–15.5)
WBC: 10.7 K/uL — ABNORMAL HIGH (ref 4.0–10.5)
nRBC: 0 % (ref 0.0–0.2)

## 2024-08-26 LAB — GLUCOSE, CAPILLARY
Glucose-Capillary: 220 mg/dL — ABNORMAL HIGH (ref 70–99)
Glucose-Capillary: 489 mg/dL — ABNORMAL HIGH (ref 70–99)
Glucose-Capillary: 286 mg/dL — ABNORMAL HIGH (ref 70–99)
Glucose-Capillary: 291 mg/dL — ABNORMAL HIGH (ref 70–99)
Glucose-Capillary: 236 mg/dL — ABNORMAL HIGH (ref 70–99)

## 2024-08-26 LAB — PROTIME-INR
INR: 1.2 (ref 0.8–1.2)
Prothrombin Time: 16.3 s — ABNORMAL HIGH (ref 11.4–15.2)

## 2024-08-26 LAB — APTT: aPTT: 31 s (ref 24–36)

## 2024-08-26 MED ORDER — PREDNISONE 20 MG PO TABS: 40.0000 mg | ORAL_TABLET | Freq: Every day | ORAL | Status: DC

## 2024-08-26 MED ORDER — PREDNISONE 20 MG PO TABS: 20.0000 mg | ORAL_TABLET | Freq: Every day | ORAL | Status: DC

## 2024-08-26 MED ORDER — PREDNISONE 50 MG PO TABS: 60.0000 mg | ORAL_TABLET | Freq: Every day | ORAL | Status: DC

## 2024-08-26 MED ORDER — INSULIN ASPART 100 UNIT/ML IJ SOLN
0.0000 [IU] | Freq: Three times a day (TID) | INTRAMUSCULAR | Status: DC
  Administered 2024-08-26: 3 [IU] via SUBCUTANEOUS
  Administered 2024-08-26: 5 [IU] via SUBCUTANEOUS

## 2024-08-26 MED ORDER — INSULIN ASPART 100 UNIT/ML IJ SOLN: 0.0000 [IU] | Freq: Every day | INTRAMUSCULAR | Status: DC

## 2024-08-26 MED ORDER — INSULIN GLARGINE 100 UNIT/ML ~~LOC~~ SOLN
10.0000 [IU] | Freq: Every day | SUBCUTANEOUS | Status: DC
  Administered 2024-08-27: 10 [IU] via SUBCUTANEOUS
  Filled 2024-08-26 (×3): qty 0.1

## 2024-08-26 MED ORDER — INSULIN ASPART 100 UNIT/ML IJ SOLN: 0.0000 [IU] | Freq: Three times a day (TID) | INTRAMUSCULAR | Status: DC

## 2024-08-26 MED ORDER — INSULIN ASPART 100 UNIT/ML IJ SOLN
0.0000 [IU] | Freq: Every day | INTRAMUSCULAR | Status: DC
  Administered 2024-08-26: 2 [IU] via SUBCUTANEOUS

## 2024-08-26 NOTE — Progress Notes (Signed)
 Assessment 43 y/o M w/ hx DM2 (A1c 7.1), smoking, recent diagnosis of prostatitis still on antibiotics who presents with 3 days of severe L S1 radiculopathy. MRI shows left lateral recess L5-S1 disc herniation   LOS: 0 days    Plan: OR tomorrow for left L5-S1 microdisc. Given the presence of left foot weakness despite aggressive medical management, I do not believe that there is a role for conservative measures any further. I explained that he poses significant infection risk with his smoking, diabetes, and recent prostatitis. However, in the presence of foot weakness, I think this is best for his long term neurologic outcome. I also explained the risks of CSF leak, nerve injury, lack of neurologic improvement, reherniation, general anesthesia risk, mortality. He verbalized understanding   Subjective: Pain still 10/10  Objective: Vital signs in last 24 hours: Temp:  [97.5 F (36.4 C)-98.7 F (37.1 C)] 97.5 F (36.4 C) (09/01 0820) Pulse Rate:  [73-93] 73 (09/01 0820) Resp:  [15-19] 17 (09/01 0820) BP: (141-167)/(80-95) 141/84 (09/01 0820) SpO2:  [94 %-100 %] 98 % (09/01 0820) Weight:  [114.8 kg-119.3 kg] 114.8 kg (08/31 2024)  Intake/Output from previous day: 08/31 0701 - 09/01 0700 In: 360 [P.O.:360] Out: 600 [Urine:600] Intake/Output this shift: Total I/O In: 240 [P.O.:240] Out: 300 [Urine:300]  Exam: BUE and RLE: full strength and sensation LLE: 5/5 IP, quad, ham. Pain limited distal exam. 3/5 EHL, DF. 2/5 PF Subjective numbness of the left inguinal ligament going towards the groin  Lab Results: Recent Labs    08/25/24 1423 08/26/24 0727  WBC 9.3 10.7*  HGB 14.5 14.2  HCT 43.6 42.1  PLT 326 322   BMET Recent Labs    08/25/24 1423 08/26/24 0727  NA 134* 137  K 4.2 4.2  CL 98 99  CO2 26 26  GLUCOSE 269* 239*  BUN 11 14  CREATININE 0.91 0.94  CALCIUM  8.5* 8.8*    Studies/Results: MR Cervical Spine W and Wo Contrast Result Date: 08/25/2024 EXAM: MRI  CERVICAL SPINE WITH AND WITHOUT CONTRAST 08/25/2024 03:52:01 PM TECHNIQUE: Multiplanar multisequence MRI of the cervical spine was performed without and with the administration of intravenous contrast. COMPARISON: CT of the cervical spine 03/27/2013. CLINICAL HISTORY: Neck pain, acute, prior cervical surgery; recent fevers, left arm numbness. FINDINGS: BONES AND ALIGNMENT: Anterior cervical fusion is again noted at C5-6 and C6-7. Straightening of the normal cervical lordosis is again noted. Slight dilation of the central canal is present at the C6-7 level. SPINAL CORD: Normal spinal cord size. Normal spinal cord signal. SOFT TISSUES: No paraspinal mass. C2-C3: No significant disc herniation. No spinal canal stenosis or neural foraminal narrowing. C3-C4: Broad-based disc osteophyte complex is present. Severe right and moderate left foraminal stenosis is present. Partial effacement of the ventral CSF is noted. C4-C5: Broad-based disc osteophyte complex is present. Partial effacement of ventral CSF is present. Moderate left foraminal stenosis is present. Central canal and foramina are decompressed. C5-C6: No significant disc herniation. No spinal canal stenosis or neural foraminal narrowing. C6-C7: No significant disc herniation. No spinal canal stenosis or neural foraminal narrowing. C7-T1: No significant disc herniation. No spinal canal stenosis or neural foraminal narrowing. IMPRESSION: 1. Broad-based disc osteophyte complex at C3-4 with severe right and moderate left foraminal stenosis and partial effacement of the ventral CSF. 2. Broad-based disc osteophyte complex at C4-5 with moderate left foraminal stenosis and partial effacement of the ventral CSF. 3. Slight dilation of the central canal at C6-7, unlikely to be of clinical  consequence . Electronically signed by: Lonni Necessary MD 08/25/2024 04:32 PM EDT RP Workstation: [id number]   MR Lumbar Spine W Tommye Contrast Result Date: 08/25/2024 EXAM: MR Lumbar  Spine with and without intravenous contrast. 08/25/2024 03:51:51 PM TECHNIQUE: Multiplanar multisequence MRI of the lumbar spine was performed with and without the administration of intravenous contrast. COMPARISON: MRI of the lumbar spine 08/17/2024. CLINICAL HISTORY: Back pain, LLE numbness, bladder incontinence. FINDINGS: BONES AND ALIGNMENT: Normal vertebral body heights. Normal bone marrow signal. No abnormal enhancement. Normal alignment. SPINAL CORD: Conus medullaris terminates at L1-2. SOFT TISSUES: No acute abnormality. L1-L2: No disc herniation. No spinal canal stenosis or neural foraminal narrowing. L2-L3: No disc herniation. No spinal canal stenosis or neural foraminal narrowing. L3-L4: No disc herniation. No spinal canal stenosis or neural foraminal narrowing. L4-L5: No disc herniation. No spinal canal stenosis or neural foraminal narrowing. L5-S1: Left paramedian disc protrusion with displacement of the traversing left S1 nerve roots. Mild left foraminal stenosis is present. IMPRESSION: 1. Left paramedian disc protrusion with similar displacement of the traversing left S1 nerve roots and mild left foraminal stenosis Electronically signed by: Lonni Necessary MD 08/25/2024 04:24 PM EDT RP Workstation: [id number]      Dorn SAUNDERS Solangel Mcmanaway 08/26/2024, 1:25 PM

## 2024-08-26 NOTE — Plan of Care (Signed)
  Problem: Education: Goal: Knowledge of General Education information will improve Description: Including pain rating scale, medication(s)/side effects and non-pharmacologic comfort measures 08/26/2024 0214 by Mallory Marks, Ji Fairburn Anne D, RN Outcome: Not Progressing 08/26/2024 0214 by Mallory Marks, Rick Warnick Anne D, RN Outcome: Not Progressing   Problem: Activity: Goal: Risk for activity intolerance will decrease 08/26/2024 0214 by Mallory Marks, Laveta Arlean BIRCH, RN Outcome: Not Progressing 08/26/2024 0214 by Mallory Marks, Laveta Arlean BIRCH, RN Outcome: Not Progressing   Problem: Coping: Goal: Level of anxiety will decrease 08/26/2024 0214 by Mallory Marks, Laveta Arlean BIRCH, RN Outcome: Not Progressing 08/26/2024 0214 by Mallory Marks, Laveta Arlean BIRCH, RN Outcome: Not Progressing   Problem: Pain Managment: Goal: General experience of comfort will improve and/or be controlled 08/26/2024 0214 by Mallory Marks, Laveta Arlean BIRCH, RN Outcome: Not Progressing 08/26/2024 0214 by Mallory Marks, Laveta Arlean BIRCH, RN Outcome: Not Progressing   Problem: Safety: Goal: Ability to remain free from injury will improve 08/26/2024 0214 by Mallory Marks, Laveta Arlean BIRCH, RN Outcome: Not Progressing 08/26/2024 0214 by Mallory Marks, Laveta Arlean BIRCH, RN Outcome: Not Progressing   Problem: Skin Integrity: Goal: Risk for impaired skin integrity will decrease 08/26/2024 0214 by Mallory Marks Laveta Arlean BIRCH, RN Outcome: Not Progressing 08/26/2024 0214 by Mallory Marks, Laveta Arlean BIRCH, RN Outcome: Not Progressing
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
## 2024-08-26 NOTE — Evaluation (Signed)
 Physical Therapy Evaluation Patient Details Name: Andre [name] MRN: [id number] DOB: 07/01/1981 Today's Date: 08/26/2024  History of Present Illness  43 y.o. male presents to [facility] hospital on 08/25/2024 with worsening back pain. Imaging reveals disc protrusion with displacement of L S1 nerve. PMH includes HTN, HLD, DM, bell's palsy, OSA, depression.  Clinical Impression  Pt presents to PT with deficits in functional mobility, gait, balance, strength, power, endurance. Pt is largely limited by low back and LLE radiating pain, requiring increased time and benefiting from support of a RW to ambulate currently. PT provides HEP of anterior pelvic tilt and use of a lumbar roll to manage back pain. PT is hopeful the pt's pain control will improve along with mobility, recommending Outpatient PT.        If plan is discharge home, recommend the following: A little help with walking and/or transfers;A little help with bathing/dressing/bathroom;Assistance with cooking/housework;Assist for transportation;Help with stairs or ramp for entrance   Can travel by private vehicle        Equipment Recommendations Rolling walker (2 wheels);BSC/3in1  Recommendations for Other Services       Functional Status Assessment Patient has had a recent decline in their functional status and demonstrates the ability to make significant improvements in function in a reasonable and predictable amount of time.     Precautions / Restrictions Precautions Precautions: Fall;Back Precaution Booklet Issued: No Recall of Precautions/Restrictions: Intact Precaution/Restrictions Comments: verbally reviewed back precautions Restrictions Weight Bearing Restrictions Per Provider Order: No      Mobility  Bed Mobility Overal bed mobility: Needs Assistance Bed Mobility: Rolling, Sidelying to Sit, Sit to Sidelying Rolling: Supervision Sidelying to sit: Supervision, Used rails     Sit to sidelying: Supervision       Transfers Overall transfer level: Needs assistance Equipment used: Rolling walker (2 wheels) Transfers: Sit to/from Stand Sit to Stand: Contact guard assist                Ambulation/Gait Ambulation/Gait assistance: Supervision Gait Distance (Feet): 20 Feet Assistive device: Rolling walker (2 wheels) Gait Pattern/deviations: Step-to pattern, Decreased stance time - left Gait velocity: reduced Gait velocity interpretation: <1.31 ft/sec, indicative of household ambulator   General Gait Details: pt with slowed step-to gait, reduced stance time on LLE, increased effort to advance LLE  Stairs            Wheelchair Mobility     Tilt Bed    Modified Rankin (Stroke Patients Only)       Balance Overall balance assessment: Needs assistance Sitting-balance support: No upper extremity supported, Feet supported Sitting balance-Leahy Scale: Good     Standing balance support: Bilateral upper extremity supported, Reliant on assistive device for balance Standing balance-Leahy Scale: Poor                               Pertinent Vitals/Pain Pain Assessment Pain Assessment: 0-10 Pain Score: 8  Pain Location: low back radiating to groin and LLE Pain Descriptors / Indicators: Stabbing Pain Intervention(s): Limited activity within patient's tolerance    Home Living Family/patient expects to be discharged to:: Private residence Living Arrangements: Spouse/significant other Available Help at Discharge: Family;Available PRN/intermittently Type of Home: House Home Access: Level entry       Home Layout: One level Home Equipment: None      Prior Function Prior Level of Function : Independent/Modified Independent;Working/employed;Driving  Extremity/Trunk Assessment   Upper Extremity Assessment Upper Extremity Assessment: Overall WFL for tasks assessed    Lower Extremity Assessment Lower Extremity Assessment: LLE  deficits/detail LLE Deficits / Details: generalized weakness related to pain. Pt reports tingling down LLE and L buttock LLE Sensation: decreased light touch    Cervical / Trunk Assessment Cervical / Trunk Assessment: Normal  Communication   Communication Communication: No apparent difficulties    Cognition Arousal: Alert Behavior During Therapy: WFL for tasks assessed/performed   PT - Cognitive impairments: No apparent impairments                         Following commands: Intact       Cueing Cueing Techniques: Verbal cues     General Comments General comments (skin integrity, edema, etc.): VSS on RA    Exercises Other Exercises Other Exercises: anterior pelvic tilt exercise Other Exercises: PT provides education on use of a lumbar roll for passive positioning of spine when in supine or sitting   Assessment/Plan    PT Assessment Patient needs continued PT services  PT Problem List Decreased strength;Decreased activity tolerance;Decreased balance;Decreased mobility;Decreased knowledge of use of DME;Decreased safety awareness;Decreased knowledge of precautions;Pain;Impaired sensation       PT Treatment Interventions DME instruction;Gait training;Stair training;Functional mobility training;Therapeutic activities;Therapeutic exercise;Balance training;Neuromuscular re-education;Patient/family education    PT Goals (Current goals can be found in the Care Plan section)  Acute Rehab PT Goals Patient Stated Goal: to return to work PT Goal Formulation: With patient Time For Goal Achievement: 09/09/24 Potential to Achieve Goals: Fair    Frequency Min 3X/week     Co-evaluation               AM-PAC PT 6 Clicks Mobility  Outcome Measure Help needed turning from your back to your side while in a flat bed without using bedrails?: A Little Help needed moving from lying on your back to sitting on the side of a flat bed without using bedrails?: A Little Help  needed moving to and from a bed to a chair (including a wheelchair)?: A Little Help needed standing up from a chair using your arms (e.g., wheelchair or bedside chair)?: A Little Help needed to walk in hospital room?: A Little Help needed climbing 3-5 steps with a railing? : A Lot 6 Click Score: 17    End of Session Equipment Utilized During Treatment: Gait belt Activity Tolerance: Patient limited by pain Patient left: in bed;with call bell/phone within reach Nurse Communication: Mobility status PT Visit Diagnosis: Other abnormalities of gait and mobility (R26.89);Muscle weakness (generalized) (M62.81);Pain Pain - Right/Left: Left Pain - part of body: Leg    Time: 9062-9042 PT Time Calculation (min) (ACUTE ONLY): 20 min   Charges:   PT Evaluation $PT Eval Low Complexity: 1 Low   PT General Charges $$ ACUTE PT VISIT: 1 Visit         Bernardino JINNY Ruth, PT, DPT Acute Rehabilitation Office [phone]   Bernardino JINNY Ruth 08/26/2024, 10:05 AM

## 2024-08-26 NOTE — Progress Notes (Signed)
 Andre [name]  FMW:[id number] DOB: 12/02/1981 DOA: 08/25/2024 PCP: Bulah Alm RAMAN, PA-C    Brief Narrative:  43 year old with a history of HTN, HLD, DM2, tobacco abuse, Bell's palsy, OSA, chronic back pain, and depression who presented to the ER 8/31 with 2 weeks of steadily worsening back pain and 48 hours of rapidly progressing pain which became associated with numbness and tingling in his left leg with episodes of bladder incontinence and groin numbness for approximately 2 days.  Of note he has been on an outpatient course of doxycycline  recently for treatment of suspected prostatitis/orchitis.  In the ER MRI of the C-spine showed C3-4 and C4-5 disc osteophyte complexes with moderate to severe foraminal stenosis.  MRI of the L-spine showed paramedian disc protrusion with displacement of the left S1 nerve root and mild foraminal stenosis.  Neurosurgery was consulted and the patient was admitted to the acute unit.  Goals of Care:   Code Status: Full Code   DVT prophylaxis: enoxaparin  (LOVENOX ) injection 40 mg Start: 08/26/24 1000   Interim Hx: Afebrile.  Vital signs stable.  The patient reports ongoing severe unrelenting pain.  With persistent groin paresthesia.  He denies shortness of breath or chest pain.  Assessment & Plan:  Intractable low back pain - severe L5-S1 radiculopathy - left lateral recess L5-S1 disc herniation Pt explains that his sx acutely worsened on Thursday 08/22/24 during a chiropractic adjustment - Neurosurgery has reevaluated and given progression of lower extremity weakness has now decided to take the patient to the OR tomorrow.  Prostatitis diagnosed previously Complete previously prescribed course of doxycycline   HTN Continue usual outpatient medications  HLD Continue usual rosuvastatin   DM2  OSA Continue usual home CPAP dose  Depression Continue usual bupropion    Family Communication: Spoke with significant other at  bedside Disposition: Will depend upon performance post OR   Objective: Blood pressure (!) 141/84, pulse 73, temperature (!) 97.5 F (36.4 C), resp. rate 17, height 6' 3 (1.905 m), weight 114.8 kg, SpO2 98%.  Intake/Output Summary (Last 24 hours) at 08/26/2024 0917 Last data filed at 08/26/2024 0700 Gross per 24 hour  Intake 360 ml  Output 600 ml  Net -240 ml   Filed Weights   08/25/24 1348 08/25/24 2024  Weight: 119.3 kg 114.8 kg    Examination: General: No acute respiratory distress Lungs: Clear to auscultation bilaterally without wheezes or crackles Cardiovascular: Regular rate and rhythm without murmur gallop or rub normal S1 and S2 Abdomen: Nontender, nondistended, soft, bowel sounds positive, no rebound, no ascites, no appreciable mass Extremities: No significant cyanosis, clubbing, or edema bilateral lower extremities  CBC: Recent Labs  Lab 08/21/24 1439 08/25/24 1423 08/26/24 0727  WBC 8.3 9.3 10.7*  NEUTROABS 6.4 6.8  --   HGB 16.2 14.5 14.2  HCT 47.9 43.6 42.1  MCV 97 94.0 92.7  PLT 273 326 322   Basic Metabolic Panel: Recent Labs  Lab 08/25/24 1423 08/26/24 0727  NA 134* 137  K 4.2 4.2  CL 98 99  CO2 26 26  GLUCOSE 269* 239*  BUN 11 14  CREATININE 0.91 0.94  CALCIUM  8.5* 8.8*   GFR: Estimated Creatinine Clearance: 138.4 mL/min (by C-G formula based on SCr of 0.94 mg/dL).   Scheduled Meds:  buPROPion   150 mg Oral q morning   doxycycline   100 mg Oral BID   enalapril   20 mg Oral Daily   enoxaparin  (LOVENOX ) injection  40 mg Subcutaneous Q24H   metoprolol  succinate  100 mg  Oral Daily   rosuvastatin   20 mg Oral Daily   sodium chloride  flush  3 mL Intravenous Q12H     LOS: 0 days   Reyes IVAR Moores, MD [facility] Office  [phone] Pager - Text Page per Amion  If 7PM-7AM, please contact night-coverage per Amion 08/26/2024, 9:17 AM

## 2024-08-27 ENCOUNTER — Inpatient Hospital Stay (HOSPITAL_COMMUNITY): Admitting: Certified Registered"

## 2024-08-27 ENCOUNTER — Encounter (HOSPITAL_COMMUNITY)
Admission: EM | Disposition: A | Discharge status: Home / Self Care | Payer: Self-pay | Source: Home / Self Care | Attending: Internal Medicine

## 2024-08-27 ENCOUNTER — Encounter (HOSPITAL_COMMUNITY): Payer: Self-pay | Admitting: Internal Medicine

## 2024-08-27 ENCOUNTER — Other Ambulatory Visit: Payer: Self-pay

## 2024-08-27 ENCOUNTER — Inpatient Hospital Stay (HOSPITAL_COMMUNITY)

## 2024-08-27 DIAGNOSIS — M5116 Intervertebral disc disorders with radiculopathy, lumbar region: Secondary | ICD-10-CM

## 2024-08-27 DIAGNOSIS — M549 Dorsalgia, unspecified: Secondary | ICD-10-CM | POA: Diagnosis not present

## 2024-08-27 HISTORY — PX: LUMBAR LAMINECTOMY/DECOMPRESSION MICRODISCECTOMY: SHX5026

## 2024-08-27 LAB — HEMOGLOBIN A1C
Hgb A1c MFr Bld: 7.8 % — ABNORMAL HIGH (ref 4.8–5.6)
Mean Plasma Glucose: 177.16 mg/dL

## 2024-08-27 LAB — COMPREHENSIVE METABOLIC PANEL WITH GFR
ALT: 25 U/L (ref 0–44)
AST: 13 U/L — ABNORMAL LOW (ref 15–41)
Albumin: 2.5 g/dL — ABNORMAL LOW (ref 3.5–5.0)
Alkaline Phosphatase: 68 U/L (ref 38–126)
Anion gap: 12 (ref 5–15)
BUN: 14 mg/dL (ref 6–20)
CO2: 24 mmol/L (ref 22–32)
Calcium: 8.6 mg/dL — ABNORMAL LOW (ref 8.9–10.3)
Chloride: 98 mmol/L (ref 98–111)
Creatinine, Ser: 0.89 mg/dL (ref 0.61–1.24)
GFR, Estimated: >60 mL/min (ref >60)
Glucose, Bld: 211 mg/dL — ABNORMAL HIGH (ref 70–99)
Potassium: 4.4 mmol/L (ref 3.5–5.1)
Sodium: 134 mmol/L — ABNORMAL LOW (ref 135–145)
Total Bilirubin: 0.8 mg/dL (ref 0.0–1.2)
Total Protein: 5.9 g/dL — ABNORMAL LOW (ref 6.5–8.1)

## 2024-08-27 LAB — GLUCOSE, CAPILLARY
Glucose-Capillary: 207 mg/dL — ABNORMAL HIGH (ref 70–99)
Glucose-Capillary: 198 mg/dL — ABNORMAL HIGH (ref 70–99)
Glucose-Capillary: 158 mg/dL — ABNORMAL HIGH (ref 70–99)
Glucose-Capillary: 150 mg/dL — ABNORMAL HIGH (ref 70–99)
Glucose-Capillary: 181 mg/dL — ABNORMAL HIGH (ref 70–99)
Glucose-Capillary: 567 mg/dL (ref 70–99)

## 2024-08-27 LAB — CBC
HCT: 43.5 % (ref 39.0–52.0)
Hemoglobin: 15 g/dL (ref 13.0–17.0)
MCH: 31.7 pg (ref 26.0–34.0)
MCHC: 34.5 g/dL (ref 30.0–36.0)
MCV: 92 fL (ref 80.0–100.0)
Platelets: 343 K/uL (ref 150–400)
RBC: 4.73 MIL/uL (ref 4.22–5.81)
RDW: 10.6 % — ABNORMAL LOW (ref 11.5–15.5)
WBC: 11.7 K/uL — ABNORMAL HIGH (ref 4.0–10.5)
nRBC: 0 % (ref 0.0–0.2)

## 2024-08-27 LAB — TSH: TSH: 2.251 u[IU]/mL (ref 0.350–4.500)

## 2024-08-27 LAB — SURGICAL PCR SCREEN
MRSA, PCR: NEGATIVE
Staphylococcus aureus: NEGATIVE

## 2024-08-27 SURGERY — LUMBAR LAMINECTOMY/DECOMPRESSION MICRODISCECTOMY 1 LEVEL
Anesthesia: General | Site: Back | Laterality: Left

## 2024-08-27 MED ORDER — ONDANSETRON HCL 4 MG/2ML IJ SOLN
INTRAMUSCULAR | Status: DC | PRN
  Administered 2024-08-27: 4 mg via INTRAVENOUS

## 2024-08-27 MED ORDER — 0.9 % SODIUM CHLORIDE (POUR BTL) OPTIME
TOPICAL | Status: DC | PRN
  Administered 2024-08-27: 1000 mL

## 2024-08-27 MED ORDER — KETOROLAC TROMETHAMINE 30 MG/ML IJ SOLN
INTRAMUSCULAR | Status: AC
Start: 2024-08-27 — End: 2024-08-27
  Filled 2024-08-27: qty 1

## 2024-08-27 MED ORDER — ROCURONIUM BROMIDE 10 MG/ML (PF) SYRINGE
PREFILLED_SYRINGE | INTRAVENOUS | Status: DC | PRN
  Administered 2024-08-27: 20 mg via INTRAVENOUS
  Administered 2024-08-27: 60 mg via INTRAVENOUS
  Administered 2024-08-27: 20 mg via INTRAVENOUS

## 2024-08-27 MED ORDER — ENOXAPARIN SODIUM 40 MG/0.4ML IJ SOSY: 40.0000 mg | PREFILLED_SYRINGE | INTRAMUSCULAR | Status: DC

## 2024-08-27 MED ORDER — INSULIN ASPART 100 UNIT/ML IJ SOLN: 7.0000 [IU] | Freq: Once | INTRAMUSCULAR | Status: DC

## 2024-08-27 MED ORDER — SCOPOLAMINE 1 MG/3DAYS TD PT72
MEDICATED_PATCH | TRANSDERMAL | Status: AC
  Filled 2024-08-27: qty 1

## 2024-08-27 MED ORDER — CHLORHEXIDINE GLUCONATE 0.12 % MT SOLN
15.0000 mL | Freq: Once | OROMUCOSAL | Status: AC
  Administered 2024-08-27: 15 mL via OROMUCOSAL
  Filled 2024-08-27: qty 15

## 2024-08-27 MED ORDER — DEXTROSE-SODIUM CHLORIDE 5-0.9 % IV SOLN: INTRAVENOUS | Status: DC

## 2024-08-27 MED ORDER — FENTANYL CITRATE (PF) 100 MCG/2ML IJ SOLN: 25.0000 ug | INTRAMUSCULAR | Status: DC | PRN

## 2024-08-27 MED ORDER — MIDAZOLAM HCL 2 MG/2ML IJ SOLN
INTRAMUSCULAR | Status: AC
  Filled 2024-08-27: qty 2

## 2024-08-27 MED ORDER — ORAL CARE MOUTH RINSE: 15.0000 mL | Freq: Once | OROMUCOSAL | Status: AC

## 2024-08-27 MED ORDER — LACTATED RINGERS IV SOLN: INTRAVENOUS | Status: DC

## 2024-08-27 MED ORDER — KETAMINE HCL 50 MG/5ML IJ SOSY
PREFILLED_SYRINGE | INTRAMUSCULAR | Status: AC
Start: 2024-08-27 — End: 2024-08-27
  Filled 2024-08-27: qty 5

## 2024-08-27 MED ORDER — DEXMEDETOMIDINE HCL IN NACL 80 MCG/20ML IV SOLN
INTRAVENOUS | Status: DC | PRN
  Administered 2024-08-27: 4 ug via INTRAVENOUS
  Administered 2024-08-27 (×2): 8 ug via INTRAVENOUS

## 2024-08-27 MED ORDER — SCOPOLAMINE 1 MG/3DAYS TD PT72
1.0000 | MEDICATED_PATCH | TRANSDERMAL | Status: DC
  Administered 2024-08-27: 1 mg via TRANSDERMAL

## 2024-08-27 MED ORDER — ONDANSETRON HCL 4 MG/2ML IJ SOLN
INTRAMUSCULAR | Status: AC
  Filled 2024-08-27: qty 2

## 2024-08-27 MED ORDER — FENTANYL CITRATE (PF) 250 MCG/5ML IJ SOLN
INTRAMUSCULAR | Status: AC
  Filled 2024-08-27: qty 5

## 2024-08-27 MED ORDER — ROCURONIUM BROMIDE 10 MG/ML (PF) SYRINGE
PREFILLED_SYRINGE | INTRAVENOUS | Status: AC
  Filled 2024-08-27: qty 10

## 2024-08-27 MED ORDER — PROPOFOL 10 MG/ML IV BOLUS
INTRAVENOUS | Status: AC
  Filled 2024-08-27: qty 20

## 2024-08-27 MED ORDER — LIDOCAINE 2% (20 MG/ML) 5 ML SYRINGE
INTRAMUSCULAR | Status: DC | PRN
  Administered 2024-08-27: 100 mg via INTRAVENOUS

## 2024-08-27 MED ORDER — AMISULPRIDE (ANTIEMETIC) 5 MG/2ML IV SOLN: 10.0000 mg | Freq: Once | INTRAVENOUS | Status: DC | PRN

## 2024-08-27 MED ORDER — ONDANSETRON HCL 4 MG/2ML IJ SOLN: 4.0000 mg | Freq: Once | INTRAMUSCULAR | Status: DC | PRN

## 2024-08-27 MED ORDER — ALBUTEROL SULFATE HFA 108 (90 BASE) MCG/ACT IN AERS
INHALATION_SPRAY | RESPIRATORY_TRACT | Status: DC | PRN
  Administered 2024-08-27: 4 via RESPIRATORY_TRACT

## 2024-08-27 MED ORDER — KETOROLAC TROMETHAMINE 30 MG/ML IJ SOLN
INTRAMUSCULAR | Status: DC | PRN
Start: 2024-08-27 — End: 2024-08-27
  Administered 2024-08-27: 15 mg via INTRAVENOUS

## 2024-08-27 MED ORDER — THROMBIN 5000 UNITS EX SOLR: OROMUCOSAL | Status: DC | PRN

## 2024-08-27 MED ORDER — INSULIN ASPART 100 UNIT/ML IJ SOLN
0.0000 [IU] | INTRAMUSCULAR | Status: DC
  Administered 2024-08-27: 5 [IU] via SUBCUTANEOUS
  Administered 2024-08-27: 15 [IU] via SUBCUTANEOUS
  Administered 2024-08-27 (×2): 3 [IU] via SUBCUTANEOUS
  Administered 2024-08-28: 11 [IU] via SUBCUTANEOUS

## 2024-08-27 MED ORDER — EPHEDRINE 5 MG/ML INJ
INTRAVENOUS | Status: AC
  Filled 2024-08-27: qty 5

## 2024-08-27 MED ORDER — INSULIN ASPART 100 UNIT/ML IJ SOLN: 0.0000 [IU] | INTRAMUSCULAR | Status: DC | PRN

## 2024-08-27 MED ORDER — KETAMINE HCL 50 MG/5ML IJ SOSY
PREFILLED_SYRINGE | INTRAMUSCULAR | Status: DC | PRN
  Administered 2024-08-27: 20 mg via INTRAVENOUS
  Administered 2024-08-27: 10 mg via INTRAVENOUS

## 2024-08-27 MED ORDER — METHYLPREDNISOLONE ACETATE 80 MG/ML IJ SUSP
INTRAMUSCULAR | Status: AC
  Filled 2024-08-27: qty 1

## 2024-08-27 MED ORDER — PHENYLEPHRINE 80 MCG/ML (10ML) SYRINGE FOR IV PUSH (FOR BLOOD PRESSURE SUPPORT)
PREFILLED_SYRINGE | INTRAVENOUS | Status: DC | PRN
  Administered 2024-08-27: 160 ug via INTRAVENOUS

## 2024-08-27 MED ORDER — ALBUTEROL SULFATE HFA 108 (90 BASE) MCG/ACT IN AERS
INHALATION_SPRAY | RESPIRATORY_TRACT | Status: AC
  Filled 2024-08-27: qty 6.7

## 2024-08-27 MED ORDER — MIDAZOLAM HCL 2 MG/2ML IJ SOLN
INTRAMUSCULAR | Status: DC | PRN
  Administered 2024-08-27: 2 mg via INTRAVENOUS

## 2024-08-27 MED ORDER — ALBUMIN HUMAN 5 % IV SOLN: INTRAVENOUS | Status: DC | PRN

## 2024-08-27 MED ORDER — PHENYLEPHRINE 80 MCG/ML (10ML) SYRINGE FOR IV PUSH (FOR BLOOD PRESSURE SUPPORT)
PREFILLED_SYRINGE | INTRAVENOUS | Status: AC
  Filled 2024-08-27: qty 10

## 2024-08-27 MED ORDER — ADHERUS DURAL SEALANT
PACK | TOPICAL | Status: DC | PRN
  Administered 2024-08-27: 1 via TOPICAL

## 2024-08-27 MED ORDER — DEXMEDETOMIDINE HCL IN NACL 80 MCG/20ML IV SOLN
INTRAVENOUS | Status: AC
  Filled 2024-08-27: qty 20

## 2024-08-27 MED ORDER — LIDOCAINE-EPINEPHRINE 1 %-1:100000 IJ SOLN
INTRAMUSCULAR | Status: AC
Start: 2024-08-27 — End: 2024-08-27
  Filled 2024-08-27: qty 1

## 2024-08-27 MED ORDER — THROMBIN 5000 UNITS EX KIT
PACK | CUTANEOUS | Status: AC
  Filled 2024-08-27: qty 1

## 2024-08-27 MED ORDER — LIDOCAINE 2% (20 MG/ML) 5 ML SYRINGE
INTRAMUSCULAR | Status: AC
  Filled 2024-08-27: qty 5

## 2024-08-27 MED ORDER — FENTANYL CITRATE (PF) 250 MCG/5ML IJ SOLN
INTRAMUSCULAR | Status: DC | PRN
  Administered 2024-08-27: 50 ug via INTRAVENOUS
  Administered 2024-08-27: 100 ug via INTRAVENOUS

## 2024-08-27 MED ORDER — PROPOFOL 10 MG/ML IV BOLUS
INTRAVENOUS | Status: DC | PRN
  Administered 2024-08-27: 120 mg via INTRAVENOUS

## 2024-08-27 MED ORDER — HEMOSTATIC AGENTS (NO CHARGE) OPTIME
TOPICAL | Status: DC | PRN
  Administered 2024-08-27: 1 via TOPICAL

## 2024-08-27 MED ORDER — CEFAZOLIN SODIUM-DEXTROSE 2-4 GM/100ML-% IV SOLN
2.0000 g | INTRAVENOUS | Status: AC
  Administered 2024-08-27: 2 g via INTRAVENOUS
  Filled 2024-08-27: qty 100

## 2024-08-27 MED ORDER — MORPHINE SULFATE (PF) 2 MG/ML IV SOLN: 2.0000 mg | INTRAVENOUS | Status: DC | PRN

## 2024-08-27 MED ORDER — DEXAMETHASONE SODIUM PHOSPHATE 10 MG/ML IJ SOLN
INTRAMUSCULAR | Status: DC | PRN
  Administered 2024-08-27: 10 mg via INTRAVENOUS

## 2024-08-27 MED ORDER — CYCLOBENZAPRINE HCL 5 MG PO TABS: 5.0000 mg | ORAL_TABLET | Freq: Three times a day (TID) | ORAL | Status: DC | PRN

## 2024-08-27 MED ORDER — LIDOCAINE-EPINEPHRINE 1 %-1:100000 IJ SOLN
INTRAMUSCULAR | Status: DC | PRN
  Administered 2024-08-27: 10 mL

## 2024-08-27 MED ORDER — SUGAMMADEX SODIUM 200 MG/2ML IV SOLN
INTRAVENOUS | Status: DC | PRN
  Administered 2024-08-27: 200 mg via INTRAVENOUS

## 2024-08-27 MED ORDER — ACETAMINOPHEN 10 MG/ML IV SOLN
INTRAVENOUS | Status: DC | PRN
  Administered 2024-08-27: 1000 mg via INTRAVENOUS

## 2024-08-27 MED ORDER — DEXAMETHASONE SODIUM PHOSPHATE 10 MG/ML IJ SOLN
INTRAMUSCULAR | Status: AC
  Filled 2024-08-27: qty 1

## 2024-08-27 MED ORDER — PHENYLEPHRINE HCL-NACL 20-0.9 MG/250ML-% IV SOLN
INTRAVENOUS | Status: DC | PRN
  Administered 2024-08-27: 20 ug/min via INTRAVENOUS

## 2024-08-27 SURGICAL SUPPLY — 47 items
BAG COUNTER SPONGE SURGICOUNT (BAG) ×1 IMPLANT
BLADE CLIPPER SURG (BLADE) IMPLANT
BUR MATCHSTICK NEURO 3.0 LAGG (BURR) IMPLANT
CANISTER SUCTION 3000ML PPV (SUCTIONS) ×1 IMPLANT
DERMABOND ADVANCED .7 DNX12 (GAUZE/BANDAGES/DRESSINGS) IMPLANT
DRAPE C-ARM 42X72 X-RAY (DRAPES) ×2 IMPLANT
DRAPE LAPAROTOMY 100X72X124 (DRAPES) ×1 IMPLANT
DRAPE SURG 17X23 STRL (DRAPES) ×1 IMPLANT
DRAPE UTILITY XL STRL (DRAPES) ×1 IMPLANT
DURAPREP 26ML APPLICATOR (WOUND CARE) ×1 IMPLANT
DURASEAL SPINE SEALANT 5 POLY (MISCELLANEOUS) IMPLANT
ELECTRODE BLADE INSULATED 4IN (ELECTROSURGICAL) ×1 IMPLANT
ELECTRODE REM PT RTRN 9FT ADLT (ELECTROSURGICAL) ×1 IMPLANT
GAUZE 4X4 16PLY ~~LOC~~+RFID DBL (SPONGE) IMPLANT
GAUZE SPONGE 4X4 12PLY STRL (GAUZE/BANDAGES/DRESSINGS) IMPLANT
GLOVE INDICATOR 8.5 STRL (GLOVE) ×3 IMPLANT
GLOVE SURG SYN 8.0 PF PI (GLOVE) ×1 IMPLANT
GOWN STRL REUS W/ TWL LRG LVL3 (GOWN DISPOSABLE) ×1 IMPLANT
GOWN STRL REUS W/ TWL XL LVL3 (GOWN DISPOSABLE) ×2 IMPLANT
GOWN STRL REUS W/TWL 2XL LVL3 (GOWN DISPOSABLE) IMPLANT
GRAFT DURAGEN MATRIX 1WX1L (Tissue) IMPLANT
HEMOSTAT POWDER KIT SURGIFOAM (HEMOSTASIS) IMPLANT
KIT BASIN OR (CUSTOM PROCEDURE TRAY) ×1 IMPLANT
KIT POSITIONER JACKSON TABLE (MISCELLANEOUS) ×1 IMPLANT
KIT TURNOVER KIT B (KITS) ×1 IMPLANT
NDL HYPO 18GX1.5 BLUNT FILL (NEEDLE) IMPLANT
NDL HYPO 22X1.5 SAFETY MO (MISCELLANEOUS) ×1 IMPLANT
NEEDLE HYPO 18GX1.5 BLUNT FILL (NEEDLE) ×1 IMPLANT
NEEDLE HYPO 22X1.5 SAFETY MO (MISCELLANEOUS) ×1 IMPLANT
NS IRRIG 1000ML POUR BTL (IV SOLUTION) ×1 IMPLANT
PACK LAMINECTOMY NEURO (CUSTOM PROCEDURE TRAY) ×1 IMPLANT
PAD ARMBOARD POSITIONER FOAM (MISCELLANEOUS) ×3 IMPLANT
SEALANT ADHERUS EXTEND TIP (MISCELLANEOUS) IMPLANT
SPIKE FLUID TRANSFER (MISCELLANEOUS) ×1 IMPLANT
SPONGE SURGIFOAM ABS GEL SZ50 (HEMOSTASIS) IMPLANT
SPONGE T-LAP 4X18 ~~LOC~~+RFID (SPONGE) IMPLANT
SURGIFLO W/THROMBIN 8M KIT (HEMOSTASIS) ×1 IMPLANT
SUT MNCRL AB 4-0 PS2 18 (SUTURE) ×1 IMPLANT
SUT PROLENE 6 0 BV (SUTURE) IMPLANT
SUT VIC AB 0 CTX 18 (SUTURE) IMPLANT
SUT VIC AB 2-0 CT1 18 (SUTURE) ×1 IMPLANT
SUT VIC AB 3-0 SH 8-18 (SUTURE) IMPLANT
SWAB COLLECTION DEVICE MRSA (MISCELLANEOUS) ×1 IMPLANT
SWAB CULTURE ESWAB REG 1ML (MISCELLANEOUS) ×1 IMPLANT
TOWEL GREEN STERILE (TOWEL DISPOSABLE) ×1 IMPLANT
TOWEL GREEN STERILE FF (TOWEL DISPOSABLE) ×1 IMPLANT
WATER STERILE IRR 1000ML POUR (IV SOLUTION) ×1 IMPLANT

## 2024-08-27 NOTE — Progress Notes (Signed)
Patient transported to surgery. 

## 2024-08-27 NOTE — Anesthesia Procedure Notes (Signed)
 Procedure Name: Intubation Date/Time: 08/27/2024 1:33 PM  Performed by: Corinne Garnette BRAVO, MDPre-anesthesia Checklist: Patient identified, Emergency Drugs available, Suction available and Patient being monitored Patient Re-evaluated:Patient Re-evaluated prior to induction Oxygen Delivery Method: Circle system utilized Preoxygenation: Pre-oxygenation with 100% oxygen Induction Type: IV induction Ventilation: Mask ventilation without difficulty Laryngoscope Size: Glidescope and 3 Grade View: Grade I Tube type: Oral Tube size: 7.5 mm Number of attempts: 1 Airway Equipment and Method: Stylet and Oral airway Placement Confirmation: ETT inserted through vocal cords under direct vision, positive ETCO2 and breath sounds checked- equal and bilateral Secured at: 23 cm Tube secured with: Tape Dental Injury: Teeth and Oropharynx as per pre-operative assessment  Difficulty Due To: Difficulty was anticipated and Difficult Airway- due to reduced neck mobility

## 2024-08-27 NOTE — Progress Notes (Signed)
 Andre [name]  FMW:[id number] DOB: Apr 03, 1981 DOA: 08/25/2024 PCP: Bulah Alm RAMAN, PA-C    Brief Narrative:  43 year old with a history of HTN, HLD, DM2, tobacco abuse, Bell's palsy, OSA, chronic back pain, and depression who presented to the ER 8/31 with 2 weeks of steadily worsening back pain and 48 hours of rapidly progressing pain (immediately following a chiropractic adjustment) which became associated with numbness and tingling in his left leg with episodes of bladder incontinence and groin numbness for approximately 2 days.  Of note he had been on an outpatient course of doxycycline  recently for treatment of suspected prostatitis/orchitis.  In the ER MRI of the C-spine showed C3-4 and C4-5 disc osteophyte complexes with moderate to severe foraminal stenosis.  MRI of the L-spine showed paramedian disc protrusion with displacement of the left S1 nerve root and mild foraminal stenosis.  Neurosurgery was consulted and the patient was admitted to the acute unit.  Goals of Care:   Code Status: Full Code   DVT prophylaxis: SCD's Start: 08/27/24 0345 enoxaparin  (LOVENOX ) injection 40 mg Start: 08/26/24 1000   Interim Hx: No acute events reported overnight.  Afebrile.  Vital signs stable.  CBG elevated above goal but is improving consistently.  Reports pain and paresthesia/weakness have not changed overnight.  Anxious to get to the OR.  Assessment & Plan:  Intractable low back pain - severe L5-S1 radiculopathy - left lateral recess L5-S1 disc herniation Neurosurgery has reevaluated and given progression of lower extremity weakness has now decided to take the patient to the OR today  Prostatitis diagnosed previously Completed previously prescribed course of doxycycline  - no persisting symptoms to suggest active infection  HTN Continue usual outpatient medications  HLD Continue usual rosuvastatin   DM2 A1c 7.8 -CBG improving with adjustment in insulin   therapy  OSA Continue usual home CPAP   Depression Continue usual bupropion    Family Communication: Spoke with spouse at bedside Disposition: Will depend upon performance post-op Outpatient Pt9/12/2023 0957  Objective: Blood pressure 132/81, pulse 86, temperature 98.2 F (36.8 C), temperature source Oral, resp. rate 14, height 6' 3 (1.905 m), weight 114.8 kg, SpO2 95%.  Intake/Output Summary (Last 24 hours) at 08/27/2024 0854 Last data filed at 08/27/2024 0800 Gross per 24 hour  Intake 480 ml  Output 1100 ml  Net -620 ml   Filed Weights   08/25/24 1348 08/25/24 2024  Weight: 119.3 kg 114.8 kg    Examination: General: No acute respiratory distress Lungs: Clear to auscultation bilaterally  Cardiovascular: Regular rate and rhythm  Abdomen: Nontender, nondistended, soft, bowel sounds positive, no rebound Extremities: No significant edema bilateral lower extremities  CBC: Recent Labs  Lab 08/21/24 1439 08/25/24 1423 08/26/24 0727 08/27/24 0627  WBC 8.3 9.3 10.7* 11.7*  NEUTROABS 6.4 6.8  --   --   HGB 16.2 14.5 14.2 15.0  HCT 47.9 43.6 42.1 43.5  MCV 97 94.0 92.7 92.0  PLT 273 326 322 343   Basic Metabolic Panel: Recent Labs  Lab 08/25/24 1423 08/26/24 0727 08/27/24 0627  NA 134* 137 134*  K 4.2 4.2 4.4  CL 98 99 98  CO2 26 26 24   GLUCOSE 269* 239* 211*  BUN 11 14 14   CREATININE 0.91 0.94 0.89  CALCIUM  8.5* 8.8* 8.6*   GFR: Estimated Creatinine Clearance: 146.2 mL/min (by C-G formula based on SCr of 0.89 mg/dL).   Scheduled Meds:  buPROPion   150 mg Oral q morning   doxycycline   100 mg Oral BID   enalapril   20 mg Oral Daily   enoxaparin  (LOVENOX ) injection  40 mg Subcutaneous Q24H   insulin  aspart  0-15 Units Subcutaneous TID WC   insulin  aspart  0-5 Units Subcutaneous QHS   insulin  glargine  10 Units Subcutaneous Q2200   metoprolol  succinate  100 mg Oral Daily   rosuvastatin   20 mg Oral Daily   sodium chloride  flush  3 mL Intravenous Q12H      LOS: 1 day   Reyes IVAR Moores, MD [facility] Office  [phone] Pager - Text Page per Tracey  If 7PM-7AM, please contact night-coverage per Amion 08/27/2024, 8:54 AM

## 2024-08-27 NOTE — Plan of Care (Signed)
   Problem: Pain Managment: Goal: General experience of comfort will improve and/or be controlled Outcome: Progressing   Problem: Skin Integrity: Goal: Risk for impaired skin integrity will decrease Outcome: Progressing

## 2024-08-27 NOTE — Progress Notes (Signed)
   08/27/24 1039  PT Visit Information  Last PT Received On 08/27/24  Assistance Needed +1  Reason Eval/Treat Not Completed Patient at procedure or test/unavailable  History of Present Illness 43 y.o. male presents to [facility] hospital on 08/25/2024 with worsening back pain. Imaging reveals disc protrusion with displacement of L S1 nerve. PMH includes HTN, HLD, DM, bell's palsy, OSA, depression.   Awaiting spinal sx this day, will reassess post operatively.   Stann, PT Acute Rehabilitation Services Office: [phone] 08/27/2024

## 2024-08-27 NOTE — Progress Notes (Signed)
 Assessment 43 y/o M w/ hx DM2 (A1c 7.1), smoking, recent diagnosis of prostatitis still on antibiotics who presents with 3 days of severe L S1 radiculopathy. MRI shows left lateral recess L5-S1 disc herniation   LOS: 1 day    Plan: OR today    Subjective: Still with severe LLE pain and L foot weakness  Objective: Vital signs in last 24 hours: Temp:  [97.5 F (36.4 C)-98.9 F (37.2 C)] 98.7 F (37.1 C) (09/02 0500) Pulse Rate:  [73-88] 84 (09/02 0500) Resp:  [17-18] 18 (09/02 0500) BP: (106-141)/(74-90) 118/79 (09/02 0500) SpO2:  [93 %-99 %] 93 % (09/02 0500)  Intake/Output from previous day: 09/01 0701 - 09/02 0700 In: 480 [P.O.:480] Out: 900 [Urine:900] Intake/Output this shift: No intake/output data recorded.  Exam: BUE and RLE: full strength and sensation LLE: 5/5 IP, quad, ham. Pain limited distal exam. 3/5 EHL, DF. 2/5 PF Subjective numbness of the left inguinal ligament going towards the groin  Lab Results: Recent Labs    08/26/24 0727 08/27/24 0627  WBC 10.7* 11.7*  HGB 14.2 15.0  HCT 42.1 43.5  PLT 322 343   BMET Recent Labs    08/26/24 0727 08/27/24 0627  NA 137 134*  K 4.2 4.4  CL 99 98  CO2 26 24  GLUCOSE 239* 211*  BUN 14 14  CREATININE 0.94 0.89  CALCIUM  8.8* 8.6*    Studies/Results: MR Cervical Spine W and Wo Contrast Result Date: 08/25/2024 EXAM: MRI CERVICAL SPINE WITH AND WITHOUT CONTRAST 08/25/2024 03:52:01 PM TECHNIQUE: Multiplanar multisequence MRI of the cervical spine was performed without and with the administration of intravenous contrast. COMPARISON: CT of the cervical spine 03/27/2013. CLINICAL HISTORY: Neck pain, acute, prior cervical surgery; recent fevers, left arm numbness. FINDINGS: BONES AND ALIGNMENT: Anterior cervical fusion is again noted at C5-6 and C6-7. Straightening of the normal cervical lordosis is again noted. Slight dilation of the central canal is present at the C6-7 level. SPINAL CORD: Normal spinal cord size.  Normal spinal cord signal. SOFT TISSUES: No paraspinal mass. C2-C3: No significant disc herniation. No spinal canal stenosis or neural foraminal narrowing. C3-C4: Broad-based disc osteophyte complex is present. Severe right and moderate left foraminal stenosis is present. Partial effacement of the ventral CSF is noted. C4-C5: Broad-based disc osteophyte complex is present. Partial effacement of ventral CSF is present. Moderate left foraminal stenosis is present. Central canal and foramina are decompressed. C5-C6: No significant disc herniation. No spinal canal stenosis or neural foraminal narrowing. C6-C7: No significant disc herniation. No spinal canal stenosis or neural foraminal narrowing. C7-T1: No significant disc herniation. No spinal canal stenosis or neural foraminal narrowing. IMPRESSION: 1. Broad-based disc osteophyte complex at C3-4 with severe right and moderate left foraminal stenosis and partial effacement of the ventral CSF. 2. Broad-based disc osteophyte complex at C4-5 with moderate left foraminal stenosis and partial effacement of the ventral CSF. 3. Slight dilation of the central canal at C6-7, unlikely to be of clinical consequence . Electronically signed by: Lonni Necessary MD 08/25/2024 04:32 PM EDT RP Workstation: [id number]   MR Lumbar Spine W Tommye Contrast Result Date: 08/25/2024 EXAM: MR Lumbar Spine with and without intravenous contrast. 08/25/2024 03:51:51 PM TECHNIQUE: Multiplanar multisequence MRI of the lumbar spine was performed with and without the administration of intravenous contrast. COMPARISON: MRI of the lumbar spine 08/17/2024. CLINICAL HISTORY: Back pain, LLE numbness, bladder incontinence. FINDINGS: BONES AND ALIGNMENT: Normal vertebral body heights. Normal bone marrow signal. No abnormal enhancement. Normal alignment. SPINAL CORD:  Conus medullaris terminates at L1-2. SOFT TISSUES: No acute abnormality. L1-L2: No disc herniation. No spinal canal stenosis or neural  foraminal narrowing. L2-L3: No disc herniation. No spinal canal stenosis or neural foraminal narrowing. L3-L4: No disc herniation. No spinal canal stenosis or neural foraminal narrowing. L4-L5: No disc herniation. No spinal canal stenosis or neural foraminal narrowing. L5-S1: Left paramedian disc protrusion with displacement of the traversing left S1 nerve roots. Mild left foraminal stenosis is present. IMPRESSION: 1. Left paramedian disc protrusion with similar displacement of the traversing left S1 nerve roots and mild left foraminal stenosis Electronically signed by: Lonni Necessary MD 08/25/2024 04:24 PM EDT RP Workstation: [id number]      Dorn SAUNDERS Andre [name] 08/27/2024, 7:41 AM

## 2024-08-27 NOTE — Anesthesia Preprocedure Evaluation (Addendum)
 Anesthesia Evaluation  Patient identified by MRN, date of birth, ID band Patient awake    Reviewed: Allergy & Precautions, NPO status , Patient's Chart, lab work & pertinent test results  Airway Mallampati: III  TM Distance: >3 FB Neck ROM: Limited    Dental  (+) Dental Advisory Given, Missing   Pulmonary sleep apnea , Current Smoker and Patient abstained from smoking.   Pulmonary exam normal breath sounds clear to auscultation       Cardiovascular hypertension, Pt. on medications and Pt. on home beta blockers Normal cardiovascular exam Rhythm:Regular Rate:Normal     Neuro/Psych  PSYCHIATRIC DISORDERS  Depression    Intractable back pain S/p 3 level ACDF  Neuromuscular disease    GI/Hepatic negative GI ROS, Neg liver ROS,,,  Endo/Other  diabetes, Type 2, Oral Hypoglycemic Agents  Obesity   Renal/GU negative Renal ROS     Musculoskeletal negative musculoskeletal ROS (+)    Abdominal   Peds  Hematology negative hematology ROS (+)   Anesthesia Other Findings Day of surgery medications reviewed with the patient.  Reproductive/Obstetrics                              Anesthesia Physical Anesthesia Plan  ASA: 2  Anesthesia Plan: General   Post-op Pain Management: Tylenol  PO (pre-op)* and Ketamine  IV*   Induction: Intravenous  PONV Risk Score and Plan: 2 and Midazolam , Dexamethasone , Ondansetron  and Scopolamine  patch - Pre-op  Airway Management Planned: Oral ETT and Video Laryngoscope Planned  Additional Equipment:   Intra-op Plan:   Post-operative Plan: Extubation in OR  Informed Consent: I have reviewed the patients History and Physical, chart, labs and discussed the procedure including the risks, benefits and alternatives for the proposed anesthesia with the patient or authorized representative who has indicated his/her understanding and acceptance.     Dental advisory  given  Plan Discussed with: CRNA  Anesthesia Plan Comments:          Anesthesia Quick Evaluation

## 2024-08-27 NOTE — Progress Notes (Signed)
 OT Cancellation Note  Patient Details Name: Andre [name] MRN: [id number] DOB: 05/24/1981   Cancelled Treatment:    Reason Eval/Treat Not Completed: Patient at procedure or test/ unavailable.  Sleeping soundly, awaiting spine sx this date.    Micky Overturf D Alegria Dominique 08/27/2024, 9:20 AM 08/27/2024  RP, OTR/L  Acute Rehabilitation Services  Office:  [phone]

## 2024-08-27 NOTE — Progress Notes (Signed)
       Overnight   NAME: Andre [name] MRN: [id number] DOB : 10-Jul-1981    Date of Service   08/27/2024   HPI/Events of Note    Notified by RN for CBG   Pt on regular diet    Interventions/ Plan   Changed diet to Carb mod diet  Give Insulin  based on Sliding scale  Give Lantus  as ordered  Recheck CBG in 2 hours  Re-dose insulin  as indicated If no improvement will acquire Lab values       Lynwood Kipper BSN MSNA MSN ACNPC-AG Acute Care Nurse Practitioner [facility]

## 2024-08-27 NOTE — Transfer of Care (Signed)
 Immediate Anesthesia Transfer of Care Note  Patient: Andre [name]  Procedure(s) Performed: Minimally Invasive Left Lumbar five-Sacral one Microdiscectomy (Left: Back)  Patient Location: PACU  Anesthesia Type:General  Level of Consciousness: sedated  Airway & Oxygen Therapy: Patient Spontanous Breathing and Patient connected to face mask oxygen  Post-op Assessment: Report given to RN and Post -op Vital signs reviewed and stable  Post vital signs: Reviewed and stable  Last Vitals:  Vitals Value Taken Time  BP 104/66 08/27/24 15:30  Temp 37 C 08/27/24 15:30  Pulse 97 08/27/24 15:35  Resp 16 08/27/24 15:35  SpO2 94 % 08/27/24 15:35  Vitals shown include unfiled device data.  Last Pain:  Vitals:   08/27/24 1223  TempSrc:   PainSc: 10-Worst pain ever         Complications: No notable events documented.

## 2024-08-27 NOTE — Anesthesia Postprocedure Evaluation (Signed)
 Anesthesia Post Note  Patient: Andre [name]  Procedure(s) Performed: Minimally Invasive Left Lumbar five-Sacral one Microdiscectomy (Left: Back)     Patient location during evaluation: PACU Anesthesia Type: General Level of consciousness: awake and alert Pain management: pain level controlled Vital Signs Assessment: post-procedure vital signs reviewed and stable Respiratory status: spontaneous breathing, nonlabored ventilation, respiratory function stable and patient connected to nasal cannula oxygen Cardiovascular status: blood pressure returned to baseline and stable Postop Assessment: no apparent nausea or vomiting Anesthetic complications: no   No notable events documented.  Last Vitals:  Vitals:   08/27/24 1600 08/27/24 1614  BP: 102/83 116/78  Pulse:  84  Resp: 12 17  Temp: 36.9 C 36.4 C  SpO2: 95% 96%    Last Pain:  Vitals:   08/27/24 1614  TempSrc: Oral  PainSc:                  Garnette FORBES Skillern

## 2024-08-27 NOTE — Progress Notes (Signed)
 Assessment 43 y/o M w/ hx DM2 (A1c 7.1), smoking, recent diagnosis of prostatitis still on antibiotics who presents with 3 days of severe L S1 radiculopathy. MRI shows left lateral recess L5-S1 disc herniation. Underwent MIS left L5-S1 microdiscectomy with intra-op durotomy closed with glue  LOS: 1 day    Plan: AAT DAT Pain control   Subjective: Pt awoke without issue. He endorsed paresthesias on the bottom of his foot, but denied leg pain  Objective: Vital signs in last 24 hours: Temp:  [98.1 F (36.7 C)-98.9 F (37.2 C)] 98.2 F (36.8 C) (09/02 0816) Pulse Rate:  [80-87] 87 (09/02 1223) Resp:  [14-18] 14 (09/02 0816) BP: (106-137)/(74-90) 130/81 (09/02 1223) SpO2:  [93 %-99 %] 93 % (09/02 1223)  Intake/Output from previous day: 09/01 0701 - 09/02 0700 In: 480 [P.O.:480] Out: 900 [Urine:900] Intake/Output this shift: Total I/O In: 1250 [I.V.:900; IV Piggyback:350] Out: 550 [Urine:550]  Exam: BUE and RLE: full strength and sensation LLE: 5/5 IP, quad, ham, DF, EHL. 2/5 PF Subjective numbness of the bottom of his left foot  Lab Results: Recent Labs    08/26/24 0727 08/27/24 0627  WBC 10.7* 11.7*  HGB 14.2 15.0  HCT 42.1 43.5  PLT 322 343   BMET Recent Labs    08/26/24 0727 08/27/24 0627  NA 137 134*  K 4.2 4.4  CL 99 98  CO2 26 24  GLUCOSE 239* 211*  BUN 14 14  CREATININE 0.94 0.89  CALCIUM  8.8* 8.6*    Studies/Results: MR Cervical Spine W and Wo Contrast Result Date: 08/25/2024 EXAM: MRI CERVICAL SPINE WITH AND WITHOUT CONTRAST 08/25/2024 03:52:01 PM TECHNIQUE: Multiplanar multisequence MRI of the cervical spine was performed without and with the administration of intravenous contrast. COMPARISON: CT of the cervical spine 03/27/2013. CLINICAL HISTORY: Neck pain, acute, prior cervical surgery; recent fevers, left arm numbness. FINDINGS: BONES AND ALIGNMENT: Anterior cervical fusion is again noted at C5-6 and C6-7. Straightening of the normal cervical  lordosis is again noted. Slight dilation of the central canal is present at the C6-7 level. SPINAL CORD: Normal spinal cord size. Normal spinal cord signal. SOFT TISSUES: No paraspinal mass. C2-C3: No significant disc herniation. No spinal canal stenosis or neural foraminal narrowing. C3-C4: Broad-based disc osteophyte complex is present. Severe right and moderate left foraminal stenosis is present. Partial effacement of the ventral CSF is noted. C4-C5: Broad-based disc osteophyte complex is present. Partial effacement of ventral CSF is present. Moderate left foraminal stenosis is present. Central canal and foramina are decompressed. C5-C6: No significant disc herniation. No spinal canal stenosis or neural foraminal narrowing. C6-C7: No significant disc herniation. No spinal canal stenosis or neural foraminal narrowing. C7-T1: No significant disc herniation. No spinal canal stenosis or neural foraminal narrowing. IMPRESSION: 1. Broad-based disc osteophyte complex at C3-4 with severe right and moderate left foraminal stenosis and partial effacement of the ventral CSF. 2. Broad-based disc osteophyte complex at C4-5 with moderate left foraminal stenosis and partial effacement of the ventral CSF. 3. Slight dilation of the central canal at C6-7, unlikely to be of clinical consequence . Electronically signed by: Lonni Necessary MD 08/25/2024 04:32 PM EDT RP Workstation: [id number]   MR Lumbar Spine W Tommye Contrast Result Date: 08/25/2024 EXAM: MR Lumbar Spine with and without intravenous contrast. 08/25/2024 03:51:51 PM TECHNIQUE: Multiplanar multisequence MRI of the lumbar spine was performed with and without the administration of intravenous contrast. COMPARISON: MRI of the lumbar spine 08/17/2024. CLINICAL HISTORY: Back pain, LLE numbness, bladder incontinence. FINDINGS:  BONES AND ALIGNMENT: Normal vertebral body heights. Normal bone marrow signal. No abnormal enhancement. Normal alignment. SPINAL CORD: Conus  medullaris terminates at L1-2. SOFT TISSUES: No acute abnormality. L1-L2: No disc herniation. No spinal canal stenosis or neural foraminal narrowing. L2-L3: No disc herniation. No spinal canal stenosis or neural foraminal narrowing. L3-L4: No disc herniation. No spinal canal stenosis or neural foraminal narrowing. L4-L5: No disc herniation. No spinal canal stenosis or neural foraminal narrowing. L5-S1: Left paramedian disc protrusion with displacement of the traversing left S1 nerve roots. Mild left foraminal stenosis is present. IMPRESSION: 1. Left paramedian disc protrusion with similar displacement of the traversing left S1 nerve roots and mild left foraminal stenosis Electronically signed by: Lonni Necessary MD 08/25/2024 04:24 PM EDT RP Workstation: [id number]      Dorn SAUNDERS Zahriah Roes 08/27/2024, 3:22 PM

## 2024-08-27 NOTE — Plan of Care (Signed)
  Problem: Coping: Goal: Level of anxiety will decrease Outcome: Progressing   Problem: Pain Managment: Goal: General experience of comfort will improve and/or be controlled Outcome: Progressing   Problem: Safety: Goal: Ability to remain free from injury will improve Outcome: Progressing   Problem: Skin Integrity: Goal: Risk for impaired skin integrity will decrease Outcome: Progressing

## 2024-08-27 NOTE — Op Note (Signed)
 PREOP DIAGNOSIS: Herniated nucleus pulposus at left L5-S1 with radiculopathy  POSTOP DIAGNOSIS: same  PROCEDURE: 1. Left L5-S1 laminotomy, medial facetectomy for excision of herniated disc using tubular retractor system 2.  Use of microscope for microdissection 3.  Repair of durotomy  SURGEON: Dr. Dorn Glade, MD  ASSISTANT: none   ANESTHESIA: General Endotracheal  EBL: 50cc  SPECIMENS: None  DRAINS: None  COMPLICATIONS: durotomy noted in axilla of left S1 nerve root  CONDITION: Stable to PCAU  HISTORY: [name] is a 43 y.o. male who presented with 5 day history of acute on chronic LLE pain with radiation to the left foot. He also had associated weakness of the left foot. MRI showed large disc fragment with caudal migration impinging the left S1 nerve root. Risks, benefits, alternatives, and expected convalescence were discussed.  Risks discussed included, but were not limited to, bleeding, pain, infection, scar, recurrent disc, instability, CSF leak, weakness, numbness, paralysis, general anesthesia risk, and death. The patient verbalized understanding and wished to proceed.  PROCEDURE IN DETAIL: The patient was brought to the operating room. After induction of general anesthesia, the patient was positioned on the operative table in the prone position on a Wilson frame with all pressure points meticulously padded. The skin of the low back was then prepped and draped in the usual sterile fashion. Preoperative antibiotics were administered  Under fluoroscopy, the correct levels were identified and marked out on the skin, and after timeout was conducted, the skin was infiltrated with local anesthetic.  Paramedian skin incision was then made sharply over the affected level and the subcutaneous tissue and fascia were incised.  Initial dilator was then passed to the interspace under fluoroscopic guidance.  The paraspinous muscle attachments were dissected from the L5  lamina using the dilators.  Successive dilators were used until an 18 mm tubular retractor was placed under fluoroscopic guidance and locked in place with an attachment arm.  Microscope was then introduced into the field.  Small amount of remaining musculature was dissected from the lamina and the interspace was visualized as well.  The medial facet was also exposed.  High-speed drill was used to perform a laminotomy as well as a modest medial facetectomy.  The ligamentum flavum was then removed with rongeurs.  The thecal sac and traversing nerve root were identified.  The epidural disc space was dissected with a 4 Penfield and bipolar electrocautery. The disc fragment was clearly within the axilla of the left S1 nerve root and pushing the root laterally towards the L5 pedicle. Facetectomy was expanded with drill and rongeurs to the level of the medial L5 pedicle. Penfield dissectors and nerve hooks were used to free the large disc fragment within the left S1 nerve root axilla. I was able to remove the herniated disc fragment piecemeal. After removal of the largest piece, we noticed a durotomy within the axilla of the left S1 nerve root. This was covered with a cotton paddy while the remaining disc fragments were removed. Following removal of the herniated disc, the nerve root appeared much more relaxed. We then turned our attention to the durotomy. After exploration, it did not appear that you could close the dural edges primarily. We laid down a piece of duragen. We then covered that with fibrin glue.  Meticulous hemostasis was obtained.  The wound was irrigated thoroughly with bacitracin impregnated irrigation.  The tubular retractor was then withdrawn with hemostasis in the muscle obtained with bipolar.  The fascia was closed with 0  Vicryl stitches.  The dermal layer was closed with 2-0 Vicryl stitches in buried interrupted fashion.  The skin was closed with 3-0 Vicryl and dermabond.  A sterile dressing was  placed.  Patient was then flipped supine and extubated by the anesthesia service.  All counts were correct at the end of surgery.

## 2024-08-28 ENCOUNTER — Encounter (HOSPITAL_COMMUNITY): Payer: Self-pay

## 2024-08-28 DIAGNOSIS — M549 Dorsalgia, unspecified: Secondary | ICD-10-CM | POA: Diagnosis not present

## 2024-08-28 LAB — BASIC METABOLIC PANEL WITH GFR
Anion gap: 10 (ref 5–15)
Anion gap: 9 (ref 5–15)
BUN: 25 mg/dL — ABNORMAL HIGH (ref 6–20)
BUN: 24 mg/dL — ABNORMAL HIGH (ref 6–20)
CO2: 25 mmol/L (ref 22–32)
CO2: 24 mmol/L (ref 22–32)
Calcium: 8.5 mg/dL — ABNORMAL LOW (ref 8.9–10.3)
Calcium: 8.4 mg/dL — ABNORMAL LOW (ref 8.9–10.3)
Chloride: 99 mmol/L (ref 98–111)
Chloride: 101 mmol/L (ref 98–111)
Creatinine, Ser: 1.26 mg/dL — ABNORMAL HIGH (ref 0.61–1.24)
Creatinine, Ser: 1.08 mg/dL (ref 0.61–1.24)
GFR, Estimated: >60 mL/min (ref >60)
GFR, Estimated: >60 mL/min (ref >60)
Glucose, Bld: 314 mg/dL — ABNORMAL HIGH (ref 70–99)
Glucose, Bld: 149 mg/dL — ABNORMAL HIGH (ref 70–99)
Potassium: 4.9 mmol/L (ref 3.5–5.1)
Potassium: 3.7 mmol/L (ref 3.5–5.1)
Sodium: 134 mmol/L — ABNORMAL LOW (ref 135–145)
Sodium: 134 mmol/L — ABNORMAL LOW (ref 135–145)

## 2024-08-28 LAB — CBC
HCT: 43.1 % (ref 39.0–52.0)
Hemoglobin: 15 g/dL (ref 13.0–17.0)
MCH: 31.9 pg (ref 26.0–34.0)
MCHC: 34.8 g/dL (ref 30.0–36.0)
MCV: 91.7 fL (ref 80.0–100.0)
Platelets: 355 K/uL (ref 150–400)
RBC: 4.7 MIL/uL (ref 4.22–5.81)
RDW: 10.6 % — ABNORMAL LOW (ref 11.5–15.5)
WBC: 12.8 K/uL — ABNORMAL HIGH (ref 4.0–10.5)
nRBC: 0 % (ref 0.0–0.2)

## 2024-08-28 LAB — GLUCOSE, CAPILLARY
Glucose-Capillary: 384 mg/dL — ABNORMAL HIGH (ref 70–99)
Glucose-Capillary: 339 mg/dL — ABNORMAL HIGH (ref 70–99)
Glucose-Capillary: 325 mg/dL — ABNORMAL HIGH (ref 70–99)
Glucose-Capillary: 203 mg/dL — ABNORMAL HIGH (ref 70–99)

## 2024-08-28 LAB — FOLATE: Folate: 12.6 ng/mL (ref >5.9)

## 2024-08-28 LAB — VITAMIN B12: Vitamin B-12: 335 pg/mL (ref 180–914)

## 2024-08-28 MED ORDER — SODIUM CHLORIDE 0.9 % IV SOLN: INTRAVENOUS | Status: DC

## 2024-08-28 MED ORDER — CYCLOBENZAPRINE HCL 5 MG PO TABS: 5.0000 mg | ORAL_TABLET | Freq: Three times a day (TID) | ORAL | 0 refills | Status: DC | PRN

## 2024-08-28 MED ORDER — DAPAGLIFLOZIN PROPANEDIOL 10 MG PO TABS
10.0000 mg | ORAL_TABLET | Freq: Every day | ORAL | Status: DC
  Administered 2024-08-28: 10 mg via ORAL
  Filled 2024-08-28 (×2): qty 1

## 2024-08-28 MED ORDER — INSULIN ASPART 100 UNIT/ML IJ SOLN
3.0000 [IU] | Freq: Three times a day (TID) | INTRAMUSCULAR | Status: DC
  Administered 2024-08-28: 3 [IU] via SUBCUTANEOUS

## 2024-08-28 MED ORDER — INSULIN ASPART 100 UNIT/ML IJ SOLN
0.0000 [IU] | Freq: Three times a day (TID) | INTRAMUSCULAR | Status: DC
  Administered 2024-08-28: 5 [IU] via SUBCUTANEOUS

## 2024-08-28 MED ORDER — INSULIN ASPART 100 UNIT/ML IJ SOLN: 0.0000 [IU] | Freq: Every day | INTRAMUSCULAR | Status: DC

## 2024-08-28 MED ORDER — POLYETHYLENE GLYCOL 3350 17 G PO PACK: 17.0000 g | PACK | Freq: Every day | ORAL | 0 refills | Status: DC | PRN

## 2024-08-28 MED ORDER — SODIUM CHLORIDE 0.9 % IV BOLUS
1000.0000 mL | Freq: Once | INTRAVENOUS | Status: AC
  Administered 2024-08-28: 1000 mL via INTRAVENOUS

## 2024-08-28 NOTE — Discharge Instructions (Signed)
 Wound Care No dressing is required over your incision. Keep clean and dry. You can shower 24 hours after surgery. Let water run over incision. Do not scrub. Pat dry Do not put any creams, lotions, or ointments on incision. Do not submerge your incision for the first 6 weeks (pool, ocean, etc)  Activity Walk each and every day, increasing distance each day. No lifting greater than 10 lbs.  Avoid excessive back motion.  Diet Resume your normal diet.  Call Your Doctor If Any of These Occur Redness, drainage, or swelling at the wound.  Temperature greater than 101 degrees. Severe pain not relieved by pain medication. Incision starts to come apart.  Follow Up Appt Call [phone] today for appointment in 6 weeks if you don't already have one or for any problems.  If you have any hardware placed in your spine, you will need an x-ray before your appointment.

## 2024-08-28 NOTE — Plan of Care (Signed)
  Problem: Pain Managment: Goal: General experience of comfort will improve and/or be controlled Outcome: Progressing   Problem: Safety: Goal: Ability to remain free from injury will improve Outcome: Progressing

## 2024-08-28 NOTE — Progress Notes (Signed)
 Physical Therapy Treatment Patient Details Name: Andre [name] MRN: [id number] DOB: 1981-09-04 Today's Date: 08/28/2024   History of Present Illness 43 y.o. male presents to [facility] hospital on 08/25/2024 with worsening back pain. Imaging reveals disc protrusion with displacement of L S1 nerve. 9/2  MIS left L5-S1 microdiscectomy with intra-op durotomy. PMH includes HTN, HLD, DM, bell's palsy, OSA, depression.   PT Comments  Pt in bed upon arrival and agreeable to PT session. Pt reported feeling better after surgery on 9/2 with slight numbness in LLE. He was alert to light touch on the left leg, but reported it felt numb with pressure. Pt met all acute PT goals being ModI for all mobility with no AD. Discussed recommendation for OP PT as pt has LLE weakness with pt verbalizing agreement. Plan for OP PT with no further acute PT needs. Pt feels comfortable discharging home whenever he is medically stable. Please re-consult if there are any changes in status.     If plan is discharge home, recommend the following: A little help with walking and/or transfers;A little help with bathing/dressing/bathroom;Assistance with cooking/housework;Assist for transportation;Help with stairs or ramp for entrance   Can travel by private vehicle      Yes  Equipment Recommendations  None recommended by PT       Precautions / Restrictions Precautions Precautions: Fall;Back Recall of Precautions/Restrictions: Intact Restrictions Weight Bearing Restrictions Per Provider Order: No     Mobility  Bed Mobility Overal bed mobility: Modified Independent   Transfers Overall transfer level: Needs assistance Equipment used: None Transfers: Sit to/from Stand Sit to Stand: Modified independent (Device/Increase time)       Ambulation/Gait Ambulation/Gait assistance: Modified independent (Device/Increase time) Gait Distance (Feet): 180 Feet Assistive device: None Gait Pattern/deviations: Step-to pattern,  Decreased stance time - left Gait velocity: decr     General Gait Details: slightly decreased stance time on LLE with increased medial/lateral sway. Steady with no UE support     Balance Overall balance assessment: Needs assistance Sitting-balance support: No upper extremity supported, Feet supported Sitting balance-Leahy Scale: Good     Standing balance support: No upper extremity supported Standing balance-Leahy Scale: Fair       Hotel manager: No apparent difficulties  Cognition Arousal: Alert Behavior During Therapy: WFL for tasks assessed/performed   PT - Cognitive impairments: No apparent impairments    Following commands: Intact      Cueing Cueing Techniques: Verbal cues     General Comments General comments (skin integrity, edema, etc.): Discussed performing LAQ, marches, STS and ankle pumps with pt agreeing to perform exercises later in the day      Pertinent Vitals/Pain Pain Assessment Pain Assessment: Faces Faces Pain Scale: Hurts a little bit Pain Location: low back Pain Descriptors / Indicators: Aching Pain Intervention(s): Limited activity within patient's tolerance, Monitored during session, Repositioned    Home Living Family/patient expects to be discharged to:: Private residence Living Arrangements: Spouse/significant other Available Help at Discharge: Family;Available PRN/intermittently Type of Home: House Home Access: Level entry       Home Layout: One level Home Equipment: None          PT Goals (current goals can now be found in the care plan section) Acute Rehab PT Goals PT Goal Formulation: With patient Time For Goal Achievement: 09/09/24 Potential to Achieve Goals: Good Progress towards PT goals: Goals met/education completed, patient discharged from PT    Frequency    Min 2X/week       AM-PAC  PT 6 Clicks Mobility   Outcome Measure  Help needed turning from your back to your side while in  a flat bed without using bedrails?: None Help needed moving from lying on your back to sitting on the side of a flat bed without using bedrails?: None Help needed moving to and from a bed to a chair (including a wheelchair)?: A Little Help needed standing up from a chair using your arms (e.g., wheelchair or bedside chair)?: A Little Help needed to walk in hospital room?: A Little Help needed climbing 3-5 steps with a railing? : A Little 6 Click Score: 20    End of Session   Activity Tolerance: Patient tolerated treatment well Patient left: in bed;with call bell/phone within reach;with family/visitor present Nurse Communication: Mobility status PT Visit Diagnosis: Other abnormalities of gait and mobility (R26.89);Muscle weakness (generalized) (M62.81)     Time: 1200-1212 PT Time Calculation (min) (ACUTE ONLY): 12 min  Charges:    $Gait Training: 8-22 mins PT General Charges $$ ACUTE PT VISIT: 1 Visit                    Kate ORN, PT, DPT Secure Chat Preferred  Rehab Office [phone]  Kate BRAVO Wendolyn 08/28/2024, 12:35 PM

## 2024-08-28 NOTE — TOC Initial Note (Signed)
 Transition of Care (TOC) - Initial/Assessment Note   Spoke to patient and family at bedside.   PT just worked with patient and is recommending OP PT , does not feel patient needs RW at this time.   Discussed above with patient and family. Patient agrees he does not need rolling walker, if he does family can get one from church. Patient also does not feel he needs 3 in 1 either.   Offered choice for [facility] , picked [place] . Referral entered . Secure chatted MD to sign  Patient Details  Name: Andre [name] MRN: [id number] Date of Birth: [birth year]  Transition of Care [facility]) CM/SW Contact:    Stephane Powell Jansky, RN Phone Number: 08/28/2024, 12:18 PM  Clinical Narrative:                   Expected Discharge Plan: Home/Self Care Barriers to Discharge: Continued Medical Work up   Patient Goals and CMS Choice Patient states their goals for this hospitalization and ongoing recovery are:: to return to home CMS Medicare.gov Compare Post Acute Care list provided to:: Patient Choice offered to / list presented to : Patient      Expected Discharge Plan and Services   Discharge Planning Services: CM Consult Post Acute Care Choice:  [facility])                   DME Arranged:  (see note)         HH Arranged: NA          Prior Living Arrangements/Services   Lives with:: Relatives Patient language and need for interpreter reviewed:: Yes Do you feel safe going back to the place where you live?: Yes      Need for Family Participation in Patient Care: Yes (Comment) Care giver support system in place?: Yes (comment)   Criminal Activity/Legal Involvement Pertinent to Current Situation/Hospitalization: No - Comment as needed  Activities of Daily Living   ADL Screening (condition at time of admission) Independently performs ADLs?: No Does the patient have a NEW difficulty with bathing/dressing/toileting/self-feeding that is expected to last >3 days?: Yes (Initiates  electronic notice to provider for possible OT consult) Does the patient have a NEW difficulty with getting in/out of bed, walking, or climbing stairs that is expected to last >3 days?: Yes (Initiates electronic notice to provider for possible PT consult) Does the patient have a NEW difficulty with communication that is expected to last >3 days?: No Is the patient deaf or have difficulty hearing?: No Does the patient have difficulty seeing, even when wearing glasses/contacts?: No Does the patient have difficulty concentrating, remembering, or making decisions?: No  Permission Sought/Granted   Permission granted to share information with : Yes, Verbal Permission Granted     Permission granted to share info w AGENCY: [facility]        Emotional Assessment Appearance:: Appears stated age Attitude/Demeanor/Rapport: Engaged Affect (typically observed): Appropriate Orientation: : Oriented to Self, Oriented to Place, Oriented to  Time, Oriented to Situation Alcohol / Substance Use: Not Applicable Psych Involvement: No (comment)  Admission diagnosis:  Lumbosacral radiculopathy [M54.17] Pain management [R52] Intractable back pain [M54.9] Acute midline low back pain with left-sided sciatica [M54.42] 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.061] Patient Active Problem List   Diagnosis Date Noted   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 [place] [address] Phone: [phone] Fax: [phone]     Social Drivers of Health (SDOH) Social History: SDOH Screenings   Food Insecurity: No Food Insecurity (08/25/2024)  Housing: Low Risk  (08/25/2024)  Transportation Needs: No Transportation Needs (08/25/2024)  Utilities: Not At Risk (08/25/2024)  Depression (PHQ2-9): Low Risk  (07/05/2024)  Tobacco Use: High Risk (08/27/2024)   SDOH Interventions:     Readmission Risk Interventions     No data to display

## 2024-08-28 NOTE — Progress Notes (Signed)
 Assessment 43 y/o M w/ hx DM2 (A1c 7.1), smoking, recent diagnosis of prostatitis still on antibiotics who presents with 3 days of severe L S1 radiculopathy. MRI shows left lateral recess L5-S1 disc herniation. Underwent MIS left L5-S1 microdiscectomy with intra-op durotomy closed with glue  LOS: 2 days    Plan: AAT DAT Pain control Ok to discharge from nsgy standpoint   Subjective: Pt walking in the halls independently during rounds. Back pain under control. Minimal leg pain. No headache  Objective: Vital signs in last 24 hours: Temp:  [97.6 F (36.4 C)-98.6 F (37 C)] 98.4 F (36.9 C) (09/03 0437) Pulse Rate:  [74-94] 74 (09/03 0437) Resp:  [12-19] 18 (09/03 0437) BP: (102-153)/(66-83) 153/82 (09/03 0437) SpO2:  [92 %-99 %] 99 % (09/03 0437)  Intake/Output from previous day: 09/02 0701 - 09/03 0700 In: 2212.2 [P.O.:480; I.V.:1382.2; IV Piggyback:350] Out: 560 [Urine:550; Blood:10] Intake/Output this shift: No intake/output data recorded.  Exam: BUE and RLE: full strength and sensation LLE: 5/5 IP, quad, ham, DF, EHL. 2/5 PF Subjective numbness of the bottom of his left foot Posterior incision c/d/I, glue  Lab Results: Recent Labs    08/27/24 0627 08/28/24 0255  WBC 11.7* 12.8*  HGB 15.0 15.0  HCT 43.5 43.1  PLT 343 355   BMET Recent Labs    08/27/24 0627 08/28/24 0255  NA 134* 134*  K 4.4 4.9  CL 98 99  CO2 24 25  GLUCOSE 211* 314*  BUN 14 25*  CREATININE 0.89 1.26*  CALCIUM  8.6* 8.5*    Studies/Results: DG Lumbar Spine 1 View Result Date: 08/27/2024 CLINICAL DATA:  Elective surgery. EXAM: LUMBAR SPINE - 1 VIEW COMPARISON:  Preoperative imaging FINDINGS: Two fluoroscopic spot views of the lumbar spine submitted from the operating room. Surgical instruments project posteriorly at the L5-S1 level. Fluoroscopy time 18 seconds. Dose 14.52 mGy. IMPRESSION: Intraoperative fluoroscopy during lumbar spine surgery. Electronically Signed   By: Andrea Gasman  M.D.   On: 08/27/2024 16:26   DG C-Arm 1-60 Min-No Report Result Date: 08/27/2024 Fluoroscopy was utilized by the requesting physician.  No radiographic interpretation.      Dorn JONELLE Glade 08/28/2024, 7:19 AM

## 2024-08-28 NOTE — Discharge Summary (Signed)
 Physician Discharge Summary   Patient: Andre [name] MRN: [id number] DOB: 1981-02-25  Admit date:     08/25/2024  Discharge date: 08/28/24  Discharge Physician: Yetta Blanch  PCP: Bulah Alm RAMAN, PA-C  Recommendations at discharge: Follow-up with PCP in 1 week with repeat BMP Discussed blood pressure medication with PCP. Follow-up with neurosurgery as recommended.   Follow-up Information     [name] Dorn SAUNDERS, MD. Call in 6 week(s).   Specialty: Neurosurgery Why: please call to schedule a 6 week postoperative visit in Dr. Shelbie clinic. Thank you Contact information: [address], [address] [place] [address] [phone]         [facility] Follow up.   Specialty: Rehabilitation Contact information: [address] [facility] [facility]  [address] [place]  [address] [phone]        Bulah Alm RAMAN, [place]. Schedule an appointment as soon as possible for a visit in 1 week(s).   Specialty: Family Medicine Why: with BMP lab to look at kidney/electrolyte numbers Contact information: [address] [place] [address] [phone]                Discharge Diagnoses: Principal Problem:   Intractable back pain Active Problems:   Hypertension associated with diabetes (HCC)   Type 2 diabetes with complication (HCC)   Bell's paralysis   OSA on CPAP   Hyperlipidemia   Idiopathic stenosis of lumbar spine  Hospital Course:  Intractable low back pain - severe L5-S1 radiculopathy - left lateral recess L5-S1 disc herniation Neurosurgery has reevaluated and given progression of lower extremity weakness took the patient for laminectomy. Patient was admitted by neurosurgery postop next day and recommended the patient to be discharged from their point of view.   Prostatitis diagnosed previously Completed previously prescribed course of doxycycline  - no persisting symptoms to suggest  active infection   HTN On enalapril  at home.  Will hold for now.  Recommend to follow-up with PCP for further discussion with repeat BMP.   HLD Continue rosuvastatin    DM2 A1c 7.8 -CBG improving with adjustment in insulin  therapy   OSA Continue usual home CPAP    Depression Does not take bupropion .  Obesity Class 1 Body mass index is 31.63 kg/m.  Placing the pt at higher risk of poor outcomes.   Pain control - [place]  Controlled Substance Reporting System database was reviewed. and patient was instructed, not to drive, operate heavy machinery, perform activities at heights, swimming or participation in water activities or provide baby-sitting services while on Pain, Sleep and Anxiety Medications; until their outpatient Physician has advised to do so again. Also recommended to not to take more than prescribed Pain, Sleep and Anxiety Medications.  Consultants:  Neurosurgery  Procedures performed:  1. Left L5-S1 laminotomy, medial facetectomy for excision of herniated disc using tubular retractor system 2.  Use of microscope for microdissection 3.  Repair of durotomy  DISCHARGE MEDICATION: Allergies as of 08/28/2024       Reactions   Other Other (See Comments)   Rybelsus  - constipation, hemorrhoids   Influenza Vaccines Other (See Comments)   Illness x last 3 vaccines   Tramadol    Janumet  [sitagliptin  Phos-metformin  Hcl] Nausea And Vomiting   Morphine  And Codeine Diarrhea, Rash   itching        Medication List     PAUSE taking these medications    enalapril  20 MG tablet Wait to take this until your doctor  or other care provider tells you to start again. Commonly known as: VASOTEC  Take 1 tablet (20 mg total) by mouth daily.       TAKE these medications    Accu-Chek Guide w/Device Kit USE TO TEST BLOOD GLUCOSE LEVELS 1-2 TIMES DAILY   acetaminophen  500 MG tablet Commonly known as: TYLENOL  Take 1,000 mg by mouth every 8 (eight) hours as needed for  mild pain (pain score 1-3).   aspirin  EC 81 MG tablet Take 1 tablet (81 mg total) by mouth daily.   BLOOD GLUCOSE TEST STRIPS Strp Test 1-2 times daily   celecoxib  200 MG capsule Commonly known as: CeleBREX  Take 1 capsule (200 mg total) by mouth 2 (two) times daily as needed.   cyclobenzaprine  5 MG tablet Commonly known as: FLEXERIL  Take 1 tablet (5 mg total) by mouth 3 (three) times daily as needed for muscle spasms.   doxycycline  100 MG tablet Commonly known as: VIBRA -TABS Take 1 tablet (100 mg total) by mouth 2 (two) times daily.   Farxiga  10 MG Tabs tablet Generic drug: dapagliflozin  propanediol Take 10 mg by mouth daily.   ibuprofen  200 MG tablet Commonly known as: ADVIL  Take 400-800 mg by mouth every 8 (eight) hours as needed for mild pain (pain score 1-3) or moderate pain (pain score 4-6).   Lancet Device Misc 1-2 times daily   Lancets Misc. Misc 1-2 times a day   metFORMIN  500 MG tablet Commonly known as: GLUCOPHAGE  Take 1 tablet (500 mg total) by mouth daily with breakfast.   metoprolol  succinate 100 MG 24 hr tablet Commonly known as: Toprol  XL Take 1 tablet (100 mg total) by mouth daily. Take with or immediately following a meal.   Microlet Lancets Misc Test daily   oxyCODONE  5 MG immediate release tablet Commonly known as: Roxicodone  Take 1 tablet (5 mg total) by mouth every 6 (six) hours as needed for severe pain (pain score 7-10).   Ozempic  (2 MG/DOSE) 8 MG/3ML Sopn Generic drug: Semaglutide  (2 MG/DOSE) DIAL AND INJECT UNDER THE SKIN 2 MG WEEKLY   polyethylene glycol 17 g packet Commonly known as: MIRALAX  / GLYCOLAX  Take 17 g by mouth daily as needed for mild constipation.   rosuvastatin  20 MG tablet Commonly known as: CRESTOR  Take 1 tablet (20 mg total) by mouth daily.               Discharge Care Instructions  (From admission, onward)           Start     Ordered   08/28/24 0000  Leave dressing on - Keep it clean, dry, and  intact until clinic visit        08/28/24 1647           Disposition: Home Diet recommendation: Carb modified diet  Discharge Exam: Vitals:   08/27/24 2206 08/28/24 0437 08/28/24 0836 08/28/24 1247  BP: 120/77 (!) 153/82 139/81 (!) 144/82  Pulse: 93 74 75 76  Resp: 18 18 17 17   Temp: 98.3 F (36.8 C) 98.4 F (36.9 C) 98.5 F (36.9 C) 97.9 F (36.6 C)  TempSrc: Oral Oral Oral Oral  SpO2: 93% 99% 96% 97%  Weight:      Height:       Clear to auscultation. S1-S2 present. Bowel sound present. No focal deficit.  Filed Weights   08/25/24 1348 08/25/24 2024  Weight: 119.3 kg 114.8 kg   Condition at discharge: stable  The results of significant diagnostics from this hospitalization (including imaging,  microbiology, ancillary and laboratory) are listed below for reference.   Imaging Studies: DG Lumbar Spine 1 View Result Date: 08/27/2024 CLINICAL DATA:  Elective surgery. EXAM: LUMBAR SPINE - 1 VIEW COMPARISON:  Preoperative imaging FINDINGS: Two fluoroscopic spot views of the lumbar spine submitted from the operating room. Surgical instruments project posteriorly at the L5-S1 level. Fluoroscopy time 18 seconds. Dose 14.52 mGy. IMPRESSION: Intraoperative fluoroscopy during lumbar spine surgery. Electronically Signed   By: Andrea Gasman M.D.   On: 08/27/2024 16:26   DG C-Arm 1-60 Min-No Report Result Date: 08/27/2024 Fluoroscopy was utilized by the requesting physician.  No radiographic interpretation.   MR Cervical Spine W and Wo Contrast Result Date: 08/25/2024 EXAM: MRI CERVICAL SPINE WITH AND WITHOUT CONTRAST 08/25/2024 03:52:01 PM TECHNIQUE: Multiplanar multisequence MRI of the cervical spine was performed without and with the administration of intravenous contrast. COMPARISON: CT of the cervical spine 03/27/2013. CLINICAL HISTORY: Neck pain, acute, prior cervical surgery; recent fevers, left arm numbness. FINDINGS: BONES AND ALIGNMENT: Anterior cervical fusion is again  noted at C5-6 and C6-7. Straightening of the normal cervical lordosis is again noted. Slight dilation of the central canal is present at the C6-7 level. SPINAL CORD: Normal spinal cord size. Normal spinal cord signal. SOFT TISSUES: No paraspinal mass. C2-C3: No significant disc herniation. No spinal canal stenosis or neural foraminal narrowing. C3-C4: Broad-based disc osteophyte complex is present. Severe right and moderate left foraminal stenosis is present. Partial effacement of the ventral CSF is noted. C4-C5: Broad-based disc osteophyte complex is present. Partial effacement of ventral CSF is present. Moderate left foraminal stenosis is present. Central canal and foramina are decompressed. C5-C6: No significant disc herniation. No spinal canal stenosis or neural foraminal narrowing. C6-C7: No significant disc herniation. No spinal canal stenosis or neural foraminal narrowing. C7-T1: No significant disc herniation. No spinal canal stenosis or neural foraminal narrowing. IMPRESSION: 1. Broad-based disc osteophyte complex at C3-4 with severe right and moderate left foraminal stenosis and partial effacement of the ventral CSF. 2. Broad-based disc osteophyte complex at C4-5 with moderate left foraminal stenosis and partial effacement of the ventral CSF. 3. Slight dilation of the central canal at C6-7, unlikely to be of clinical consequence . Electronically signed by: Lonni Necessary MD 08/25/2024 04:32 PM EDT RP Workstation: [id number]   MR Lumbar Spine W Tommye Contrast Result Date: 08/25/2024 EXAM: MR Lumbar Spine with and without intravenous contrast. 08/25/2024 03:51:51 PM TECHNIQUE: Multiplanar multisequence MRI of the lumbar spine was performed with and without the administration of intravenous contrast. COMPARISON: MRI of the lumbar spine 08/17/2024. CLINICAL HISTORY: Back pain, LLE numbness, bladder incontinence. FINDINGS: BONES AND ALIGNMENT: Normal vertebral body heights. Normal bone marrow signal. No  abnormal enhancement. Normal alignment. SPINAL CORD: Conus medullaris terminates at L1-2. SOFT TISSUES: No acute abnormality. L1-L2: No disc herniation. No spinal canal stenosis or neural foraminal narrowing. L2-L3: No disc herniation. No spinal canal stenosis or neural foraminal narrowing. L3-L4: No disc herniation. No spinal canal stenosis or neural foraminal narrowing. L4-L5: No disc herniation. No spinal canal stenosis or neural foraminal narrowing. L5-S1: Left paramedian disc protrusion with displacement of the traversing left S1 nerve roots. Mild left foraminal stenosis is present. IMPRESSION: 1. Left paramedian disc protrusion with similar displacement of the traversing left S1 nerve roots and mild left foraminal stenosis Electronically signed by: Lonni Necessary MD 08/25/2024 04:24 PM EDT RP Workstation: [id number]   MR THORACIC SPINE W WO CONTRAST Result Date: 08/17/2024 CLINICAL DATA:  Low back pain, infection suspected, positive  xray/CT; Mid-back pain, infection suspected, positive xray/CT EXAM: MRI THORACIC AND LUMBAR SPINE WITHOUT AND WITH CONTRAST TECHNIQUE: Multiplanar and multiecho pulse sequences of the thoracic and lumbar spine were obtained without and with intravenous contrast. CONTRAST:  10mL GADAVIST  GADOBUTROL  1 MMOL/ML IV SOLN COMPARISON:  None Available. FINDINGS: MRI THORACIC SPINE FINDINGS Alignment: Normal. Vertebrae: No marrow edema to suggest acute fracture or discitis/osteomyelitis. No suspicious bone lesion. No abnormal enhancement. Cord:  Normal cord signal.  No evidence of abnormal enhancement. Paraspinal and other soft tissues: No paraspinal edema. Disc levels: No significant canal or foraminal stenosis. No fluid collection within the canal. MRI LUMBAR SPINE FINDINGS Segmentation:  Standard. Alignment:  Normal. Vertebrae: No marrow edema to suggest acute fracture discitis/osteomyelitis. No suspicious bone lesions. No abnormal enhancement. Conus medullaris: Extends to the  L1-L2 level and appears normal. No abnormal enhancement. Paraspinal and other soft tissues: Unremarkable. Disc levels: T12-L1: No significant disc protrusion, foraminal stenosis, or canal stenosis. L1-L2: No significant disc protrusion, foraminal stenosis, or canal stenosis. L2-L3: No significant disc protrusion, foraminal stenosis, or canal stenosis. L3-L4: No significant disc protrusion, foraminal stenosis, or canal stenosis. L4-L5: No significant disc protrusion, foraminal stenosis, or canal stenosis. L5-S1: Disc desiccation and height loss. Large left subarticular disc protrusion with impingement of the descending left S1 nerve root. The left foramen is mildly narrowed. Right foramen and central canal are patent. IMPRESSION: 1. At L5-S1, large left subarticular disc protrusion with impingement of the descending left S1 nerve root. 2. No evidence of discitis/osteomyelitis or epidural abscess. Electronically Signed   By: Gilmore GORMAN Molt M.D.   On: 08/17/2024 20:03   MR Lumbar Spine W Wo Contrast Result Date: 08/17/2024 CLINICAL DATA:  Low back pain, infection suspected, positive xray/CT; Mid-back pain, infection suspected, positive xray/CT EXAM: MRI THORACIC AND LUMBAR SPINE WITHOUT AND WITH CONTRAST TECHNIQUE: Multiplanar and multiecho pulse sequences of the thoracic and lumbar spine were obtained without and with intravenous contrast. CONTRAST:  10mL GADAVIST  GADOBUTROL  1 MMOL/ML IV SOLN COMPARISON:  None Available. FINDINGS: MRI THORACIC SPINE FINDINGS Alignment: Normal. Vertebrae: No marrow edema to suggest acute fracture or discitis/osteomyelitis. No suspicious bone lesion. No abnormal enhancement. Cord:  Normal cord signal.  No evidence of abnormal enhancement. Paraspinal and other soft tissues: No paraspinal edema. Disc levels: No significant canal or foraminal stenosis. No fluid collection within the canal. MRI LUMBAR SPINE FINDINGS Segmentation:  Standard. Alignment:  Normal. Vertebrae: No marrow  edema to suggest acute fracture discitis/osteomyelitis. No suspicious bone lesions. No abnormal enhancement. Conus medullaris: Extends to the L1-L2 level and appears normal. No abnormal enhancement. Paraspinal and other soft tissues: Unremarkable. Disc levels: T12-L1: No significant disc protrusion, foraminal stenosis, or canal stenosis. L1-L2: No significant disc protrusion, foraminal stenosis, or canal stenosis. L2-L3: No significant disc protrusion, foraminal stenosis, or canal stenosis. L3-L4: No significant disc protrusion, foraminal stenosis, or canal stenosis. L4-L5: No significant disc protrusion, foraminal stenosis, or canal stenosis. L5-S1: Disc desiccation and height loss. Large left subarticular disc protrusion with impingement of the descending left S1 nerve root. The left foramen is mildly narrowed. Right foramen and central canal are patent. IMPRESSION: 1. At L5-S1, large left subarticular disc protrusion with impingement of the descending left S1 nerve root. 2. No evidence of discitis/osteomyelitis or epidural abscess. Electronically Signed   By: Gilmore GORMAN Molt M.D.   On: 08/17/2024 20:03   DG Chest Portable 1 View Result Date: 08/17/2024 CLINICAL DATA:  43 year old. EXAM: PORTABLE CHEST 1 VIEW COMPARISON:  Chest radiograph dated 04/07/2014. FINDINGS:  No focal consolidation, pleural effusion or pneumothorax. Top-normal cardiac silhouette. No acute osseous pathology. IMPRESSION: No active disease. Electronically Signed   By: Vanetta Chou M.D.   On: 08/17/2024 12:30   CT L-SPINE NO CHARGE Result Date: 08/17/2024 CLINICAL DATA:  Back pain. EXAM: CT LUMBAR SPINE WITHOUT CONTRAST TECHNIQUE: Multidetector CT imaging of the lumbar spine was performed without intravenous contrast administration. Multiplanar CT image reconstructions were also generated. RADIATION DOSE REDUCTION: This exam was performed according to the departmental dose-optimization program which includes automated exposure  control, adjustment of the mA and/or kV according to patient size and/or use of iterative reconstruction technique. COMPARISON:  None Available. FINDINGS: Segmentation: 5 lumbar type vertebrae. Alignment: Normal. Vertebrae: No acute fracture or focal pathologic process. Right-sided L1 rudimentary rib. Paraspinal and other soft tissues: Negative. Disc levels: Mild degenerative disc height loss at L1-L2 and L5-S1. Posterior disc bulge at L5-S1. No high-grade foraminal narrowing. Other: Mild degenerative arthropathy of the left greater than right sacroiliac joints. No erosive changes. IMPRESSION: 1. No acute fracture or traumatic listhesis of the lumbar spine. 2. Mild degenerative disc height loss at L1-L2 and L5-S1. Posterior disc bulge at L5-S1. 3. 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 Electronically Signed   By: Harrietta Sherry M.D.   On: 08/17/2024 11:17   CT Renal Stone Study Result Date: 08/17/2024 CLINICAL DATA:  Abdominal and flank pain. Kidney stones suspected. Difficulty urinating. EXAM: CT ABDOMEN AND PELVIS WITHOUT CONTRAST TECHNIQUE: Multidetector CT imaging of the abdomen and pelvis was performed following the standard protocol without IV contrast. RADIATION DOSE REDUCTION: This exam was performed according to the departmental dose-optimization program which includes automated exposure control, adjustment of the mA and/or kV according to patient size and/or use of iterative reconstruction technique. COMPARISON:  None Available. FINDINGS: Lower chest: No acute findings. Hepatobiliary: No suspicious focal abnormality in the liver on this study without intravenous contrast. There is no evidence for gallstones, gallbladder wall thickening, or pericholecystic fluid. No intrahepatic or extrahepatic biliary dilation. Pancreas: No focal mass lesion. No dilatation of the main duct. No intraparenchymal cyst. No peripancreatic edema. Spleen: No splenomegaly.  No suspicious focal mass lesion. Adrenals/Urinary Tract: No adrenal nodule or mass. Kidneys unremarkable. No evidence for hydroureter. The urinary bladder appears normal for the degree of distention. Stomach/Bowel: Stomach is unremarkable. No gastric wall thickening. No evidence of outlet obstruction. Duodenum is normally positioned as is the ligament of Treitz. No small bowel wall thickening. No small bowel dilatation. The terminal ileum is normal. The appendix is normal. No gross colonic mass. No colonic wall thickening. Vascular/Lymphatic: No abdominal aortic aneurysm. No abdominal aortic atherosclerotic calcification. There is no gastrohepatic or hepatoduodenal ligament lymphadenopathy. No retroperitoneal or mesenteric lymphadenopathy. No pelvic sidewall lymphadenopathy. Reproductive: The prostate gland and seminal vesicles are unremarkable. Other: No intraperitoneal free fluid. Musculoskeletal: No worrisome lytic or sclerotic osseous abnormality. Please see report for dedicated lumbar spine CT dictated separately. IMPRESSION: No acute findings in the abdomen or pelvis. Specifically, no findings to explain the patient's history of abdominal and flank pain. No urinary stone disease or secondary changes in either kidney or ureter. Electronically Signed   By: Camellia Candle M.D.   On: 08/17/2024 11:08    Microbiology: Results for orders placed or performed during the hospital encounter of 08/25/24  Surgical pcr screen     Status: None   Collection Time: 08/27/24  4:39 AM   Specimen: Nasal Mucosa; Nasal Swab  Result Value Ref Range  Status   MRSA, PCR NEGATIVE NEGATIVE Final   Staphylococcus aureus NEGATIVE NEGATIVE Final    Comment: (NOTE) The Xpert SA Assay (FDA approved for NASAL specimens in patients 38 years of age and older), is one component of a comprehensive surveillance program. It is not intended to diagnose infection nor to guide or monitor treatment. Performed at [facility],  [address], [place], [place] [address]    Labs: CBC: Recent Labs  Lab 08/25/24 1423 08/26/24 0727 08/27/24 0627 08/28/24 0255  WBC 9.3 10.7* 11.7* 12.8*  NEUTROABS 6.8  --   --   --   HGB 14.5 14.2 15.0 15.0  HCT 43.6 42.1 43.5 43.1  MCV 94.0 92.7 92.0 91.7  PLT 326 322 343 355   Basic Metabolic Panel: Recent Labs  Lab 08/25/24 1423 08/26/24 0727 08/27/24 0627 08/28/24 0255 08/28/24 1534  NA 134* 137 134* 134* 134*  K 4.2 4.2 4.4 4.9 3.7  CL 98 99 98 99 101  CO2 26 26 24 25 24   GLUCOSE 269* 239* 211* 314* 149*  BUN 11 14 14  25* 24*  CREATININE 0.91 0.94 0.89 1.26* 1.08  CALCIUM  8.5* 8.8* 8.6* 8.5* 8.4*   Liver Function Tests: Recent Labs  Lab 08/25/24 1423 08/26/24 0727 08/27/24 0627  AST 13* 10* 13*  ALT 31 24 25   ALKPHOS 70 64 68  BILITOT 0.8 1.0 0.8  PROT 6.0* 5.6* 5.9*  ALBUMIN  2.6* 2.5* 2.5*   CBG: Recent Labs  Lab 08/27/24 2200 08/28/24 0059 08/28/24 0436 08/28/24 0841 08/28/24 1226  GLUCAP 567* 384* 339* 325* 203*    Discharge time spent: greater than 30 minutes.  Author: Yetta Blanch, MD  [facility]

## 2024-08-28 NOTE — Evaluation (Signed)
 Occupational Therapy Evaluation Patient Details Name: Andre [name] MRN: [id number] DOB: 03/21/1981 Today's Date: 08/28/2024   History of Present Illness   43 y.o. male presents to [facility] hospital on 08/25/2024 with worsening back pain. Imaging reveals disc protrusion with displacement of L S1 nerve. 9/2  MIS left L5-S1 microdiscectomy with intra-op durotomy. PMH includes HTN, HLD, DM, bell's palsy, OSA, depression.     Clinical Impressions Pt c/o minimal pain 1/10 to low back, has been up and ambulating throughout the day independently. Pt lives at home with wife, PLOF independent, currently doing well overall mod I without AD, will need help for LB ADLs but has wife and two kids to assist as needed. Pt ambulated 200 feet supervision for safety due to L foot numbness, instructed on safety awareness with ambulating or showering due to L foot numbness, to remove area rugs if needed to prevent foot from catching while ambulating. Pt has no further acute OT or follow up needs.      If plan is discharge home, recommend the following:   A little help with walking and/or transfers;A little help with bathing/dressing/bathroom     Functional Status Assessment   Patient has had a recent decline in their functional status and demonstrates the ability to make significant improvements in function in a reasonable and predictable amount of time.     Equipment Recommendations   None recommended by OT     Recommendations for Other Services         Precautions/Restrictions   Precautions Precautions: Fall;Back Precaution Booklet Issued: No Recall of Precautions/Restrictions: Intact Restrictions Weight Bearing Restrictions Per Provider Order: No     Mobility Bed Mobility Overal bed mobility: Modified Independent                  Transfers Overall transfer level: Needs assistance Equipment used: None Transfers: Sit to/from Stand Sit to Stand: Supervision            General transfer comment: supervision for safety      Balance Overall balance assessment: Mild deficits observed, not formally tested                                         ADL either performed or assessed with clinical judgement   ADL Overall ADL's : Needs assistance/impaired                                       General ADL Comments: mod I overall but needs help with LB ADLs due to back pain/precautions     Vision Baseline Vision/History: 0 No visual deficits Ability to See in Adequate Light: 0 Adequate Patient Visual Report: No change from baseline       Perception         Praxis         Pertinent Vitals/Pain Pain Assessment Pain Assessment: 0-10 Pain Score: 1  Pain Location: low back Pain Descriptors / Indicators: Aching Pain Intervention(s): Monitored during session     Extremity/Trunk Assessment Upper Extremity Assessment Upper Extremity Assessment: Overall WFL for tasks assessed   Lower Extremity Assessment Lower Extremity Assessment: Defer to PT evaluation       Communication Communication Communication: No apparent difficulties   Cognition Arousal: Alert Behavior During Therapy: [facility] for tasks assessed/performed Cognition: No apparent  impairments                               Following commands: Intact       Cueing  General Comments   Cueing Techniques: Verbal cues      Exercises     Shoulder Instructions      Home Living Family/patient expects to be discharged to:: Private residence Living Arrangements: Spouse/significant other Available Help at Discharge: Family;Available PRN/intermittently Type of Home: House Home Access: Level entry     Home Layout: One level     Bathroom Shower/Tub: Chief Strategy Officer: Standard     Home Equipment: None          Prior Functioning/Environment Prior Level of Function : Independent/Modified  Independent;Working/employed;Driving                    OT Problem List: Decreased strength;Decreased range of motion;Impaired balance (sitting and/or standing);Pain   OT Treatment/Interventions:        OT Goals(Current goals can be found in the care plan section)   Acute Rehab OT Goals Patient Stated Goal: to return home OT Goal Formulation: With patient/family Time For Goal Achievement: 09/11/24 Potential to Achieve Goals: Good   OT Frequency:       Co-evaluation              AM-PAC OT 6 Clicks Daily Activity     Outcome Measure Help from another person eating meals?: None Help from another person taking care of personal grooming?: None Help from another person toileting, which includes using toliet, bedpan, or urinal?: None Help from another person bathing (including washing, rinsing, drying)?: A Little Help from another person to put on and taking off regular upper body clothing?: None Help from another person to put on and taking off regular lower body clothing?: A Little 6 Click Score: 22   End of Session Nurse Communication: Mobility status  Activity Tolerance: Patient tolerated treatment well Patient left: in bed;with call bell/phone within reach;with family/visitor present  OT Visit Diagnosis: Other abnormalities of gait and mobility (R26.89);Unsteadiness on feet (R26.81);Pain                Time: 1054-1110 OT Time Calculation (min): 16 min Charges:  OT General Charges $OT Visit: 1 Visit OT Evaluation $OT Eval Low Complexity: 1 Low  [address], OTR/L   Andre [name] 08/28/2024, 12:28 PM

## 2024-08-28 NOTE — Care Plan (Signed)
 AVS printed out and instructed with no questions asked. PIV removed. Patient's wife volunteered to wheel him out of the unit.

## 2024-08-29 ENCOUNTER — Telehealth: Payer: Self-pay

## 2024-08-29 NOTE — Transitions of Care (Post Inpatient/ED Visit) (Signed)
   08/29/2024  Name: Andre [name] MRN: [id number] DOB: 07-26-1981  Today's TOC FU Call Status: Today's TOC FU Call Status:: Unsuccessful Call (1st Attempt) Unsuccessful Call (1st Attempt) Date: 08/29/24  Attempted to reach the patient regarding the most recent Inpatient/ED visit.  Follow Up Plan: Additional outreach attempts will be made to reach the patient to complete the Transitions of Care (Post Inpatient/ED visit) call.   Signature Julian Lemmings, LPN [facility] Nurse Health Advisor Direct Dial [phone]

## 2024-08-30 ENCOUNTER — Ambulatory Visit: Admitting: Medical

## 2024-08-30 VITALS — BP 122/80 | HR 96 | Temp 98.6°F

## 2024-08-30 DIAGNOSIS — R509 Fever, unspecified: Secondary | ICD-10-CM

## 2024-08-30 DIAGNOSIS — E1159 Type 2 diabetes mellitus with other circulatory complications: Secondary | ICD-10-CM

## 2024-08-30 DIAGNOSIS — Z9889 Other specified postprocedural states: Secondary | ICD-10-CM

## 2024-08-30 DIAGNOSIS — N419 Inflammatory disease of prostate, unspecified: Secondary | ICD-10-CM

## 2024-08-30 DIAGNOSIS — M5126 Other intervertebral disc displacement, lumbar region: Secondary | ICD-10-CM

## 2024-08-30 DIAGNOSIS — E118 Type 2 diabetes mellitus with unspecified complications: Secondary | ICD-10-CM | POA: Diagnosis not present

## 2024-08-30 DIAGNOSIS — N179 Acute kidney failure, unspecified: Secondary | ICD-10-CM

## 2024-08-30 DIAGNOSIS — I152 Hypertension secondary to endocrine disorders: Secondary | ICD-10-CM

## 2024-08-30 NOTE — Transitions of Care (Post Inpatient/ED Visit) (Signed)
   08/30/2024  Name: Andre [name] MRN: [id number] DOB: [birth year]  Today's TOC FU Call Status: Today's TOC FU Call Status:: Unsuccessful Call (2nd Attempt) Unsuccessful Call (1st Attempt) Date: 08/29/24 Unsuccessful Call (2nd Attempt) Date: 08/30/24  Attempted to reach the patient regarding the most recent Inpatient/ED visit.  Follow Up Plan: Additional outreach attempts will be made to reach the patient to complete the Transitions of Care (Post Inpatient/ED visit) call.   Signature Julian Lemmings, LPN [facility] Nurse Health Advisor Direct Dial [phone]

## 2024-08-30 NOTE — Patient Instructions (Signed)
 Recommendations: You may start back metformin  500 mg daily  Start back on Metoprolol /Toprol -XL 100 mg daily but cut it in half and take 1/2 tablet daily for the next week Do not restart Farxiga  for another week Do not restart enalapril  for another week assuming blood pressure is 130/80 or higher If blood pressure is staying less than 130/80 in a week then you may restart the enalapril  after 1 week Restart your rosuvastatin  Crestor  in [redacted] week along with the aspirin  Restart Ozempic  0.5 mg weekly sample until you run out of the sample After you finish the Ozempic  sample then go back to your 2 mg Ozempic  Be drinking plenty of fluids throughout the day particular water.  You want your urine to be light yellow Monitor your blood pressures to make sure you are staying around 120/70.  We do not want you to drop too low such as 105/60 or lower.  If you see any readings that low then let us  know as we would need to back off some of your blood pressure medicine I would recommend a repeat blood test for your kidney marker in about a week

## 2024-08-30 NOTE — Progress Notes (Signed)
 Subjective:  Andre [name] is a 43 y.o. male who presents for Chief Complaint  Patient presents with   Hospitalization Follow-up    Hospital follow-up. Had back surgery last week. No additional concerns. Needs short term disability form filled out  Pt was not given farxiga / enalapril , metformin  in hospital. Was placed on insulin  due to steriods, had not had ozempic  in 2 weeks     Here for hospital follow-up.  He was hospitalized 08/25/2024 through 08/28/2024  Discharge Diagnoses: Principal Problem:   Intractable back pain Active Problems:   Hypertension associated with diabetes (HCC)   Type 2 diabetes with complication (HCC)   Bell's paralysis   OSA on CPAP   Hyperlipidemia   Idiopathic stenosis of lumbar spine  He had several recent office visits here and at the emergency department related to pain and fevers.  He was having persistent fevers, severe low back pain, swelling in the scrotum, fatigue and overall felt horrible.  At his most recent visit with me here on 08/21/2024 after other prior visits for the same issues we initiated doxycycline  for possible prostate infection.  He ended up going back to the hospital 08/25/2024 for severe pain.  He had imaging and ultimately had urgent neurosurgery for severe L5-S1 radiculopathy and left lateral recess L5-S1 disc herniation.  He completed a course of doxycycline  for prostatitis symptoms and was symptom-free in that regard by the time of discharge  Enalapril  was held temporarily due to dehydration and risk of kidney injury  He did have increase in blood sugars given steroid treatment in the hospital.  After discussion and disagreement on insulin  initially by patient he agreed to insulin  and had a blood sugars temporarily with the steroid use.  He did have acute kidney injury postop.  He was given fluids and had improvement in renal function.  Enalapril  was held at discharge.  Per hospital records he needs repeat basic metabolic  panel labs within a week or so.  He has follow-up planned with neurosurgery.  He needs forms completed for short-term disability.  No other aggravating or relieving factors.    No other c/o.  The following portions of the patient's history were reviewed and updated as appropriate: allergies, current medications, past family history, past medical history, past social history, past surgical history and problem list.  ROS Otherwise as in subjective above  Objective: BP 122/80   Pulse 96   Temp 98.6 F (37 C)   Wt Readings from Last 3 Encounters:  08/25/24 253 lb 1.4 oz (114.8 kg)  08/22/24 263 lb (119.3 kg)  08/21/24 263 lb 12.8 oz (119.7 kg)   BP Readings from Last 3 Encounters:  08/30/24 122/80  08/28/24 (!) 144/82  08/22/24 (!) 168/95    General appearance: alert, no distress, well developed, well nourished Lying on exam table, calm    Assessment: Encounter Diagnoses  Name Primary?   Hypertension associated with diabetes (HCC) Yes   S/P spinal surgery    Lumbar herniated disc    Type 2 diabetes with complication (HCC)    AKI (acute kidney injury) (HCC)    Febrile illness    Prostatitis, unspecified prostatitis type      Plan: I reviewed his hospitalization records, imaging, notes.  Medicines reconciled.  We discussed discharge instructions.  Starts physical therapy 09/17/24.  He notes that neurosurgery told him no lifting over 10 pounds, only light activity currently.  He has been to see neurosurgery in 6 to 8 weeks and was told  he could not work no sooner than that follow up.  Follow-up with neurosurgery on 10/07/2024  I will work on his insurance form today   Recommendations: You may start back metformin  500 mg daily  Start back on Metoprolol /Toprol -XL 100 mg daily but cut it in half and take 1/2 tablet daily for the next week Do not restart Farxiga  for another week Do not restart enalapril  for another week assuming blood pressure is 130/80 or higher If  blood pressure is staying less than 130/80 in a week then you may restart the enalapril  after 1 week Restart your rosuvastatin  Crestor  in [redacted] week along with the aspirin  Restart Ozempic  0.5 mg weekly sample until you run out of the sample After you finish the Ozempic  sample then go back to your 2 mg Ozempic  Be drinking plenty of fluids throughout the day particular water.  You want your urine to be light yellow Monitor your blood pressures to make sure you are staying around 120/70.  We do not want you to drop too low such as 105/60 or lower.  If you see any readings that low then let us  know as we would need to back off some of your blood pressure medicine I would recommend a repeat blood test for your kidney marker in about a week   Andre [name] was seen today for hospitalization follow-up.  Diagnoses and all orders for this visit:  Hypertension associated with diabetes (HCC)  S/P spinal surgery  Lumbar herniated disc  Type 2 diabetes with complication (HCC)  AKI (acute kidney injury) (HCC) -     Basic metabolic panel with GFR; Future  Febrile illness  Prostatitis, unspecified prostatitis type   Follow up: follow up next week for lab

## 2024-09-02 NOTE — Transitions of Care (Post Inpatient/ED Visit) (Signed)
   09/02/2024  Name: Andre [name] MRN: [id number] DOB: 1981-08-27  Today's TOC FU Call Status: Today's TOC FU Call Status:: Unsuccessful Call (2nd Attempt) Unsuccessful Call (1st Attempt) Date: 08/29/24 Unsuccessful Call (2nd Attempt) Date: 08/30/24  Attempted to reach the patient regarding the most recent Inpatient/ED visit.  Follow Up Plan: No further outreach attempts will be made at this time. We have been unable to contact the patient. Patient already seen in office Signature  Julian Lemmings, LPN [facility] Nurse Health Advisor Direct Dial [phone]
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
## 2024-09-16 ENCOUNTER — Encounter: Admitting: Physical Medicine and Rehabilitation

## 2024-09-17 ENCOUNTER — Ambulatory Visit: Attending: Internal Medicine | Admitting: Rehabilitation

## 2024-09-17 ENCOUNTER — Other Ambulatory Visit: Payer: Self-pay

## 2024-09-17 DIAGNOSIS — R293 Abnormal posture: Secondary | ICD-10-CM | POA: Diagnosis present

## 2024-09-17 DIAGNOSIS — R262 Difficulty in walking, not elsewhere classified: Secondary | ICD-10-CM | POA: Insufficient documentation

## 2024-09-17 DIAGNOSIS — M5459 Other low back pain: Secondary | ICD-10-CM | POA: Diagnosis present

## 2024-09-17 DIAGNOSIS — M5417 Radiculopathy, lumbosacral region: Secondary | ICD-10-CM | POA: Insufficient documentation

## 2024-09-17 DIAGNOSIS — M5416 Radiculopathy, lumbar region: Secondary | ICD-10-CM | POA: Diagnosis present

## 2024-09-17 DIAGNOSIS — M6281 Muscle weakness (generalized): Secondary | ICD-10-CM | POA: Diagnosis present

## 2024-09-20 ENCOUNTER — Other Ambulatory Visit: Payer: Self-pay

## 2024-09-20 ENCOUNTER — Ambulatory Visit

## 2024-09-20 DIAGNOSIS — R262 Difficulty in walking, not elsewhere classified: Secondary | ICD-10-CM

## 2024-09-20 DIAGNOSIS — M6281 Muscle weakness (generalized): Secondary | ICD-10-CM

## 2024-09-20 DIAGNOSIS — M5459 Other low back pain: Secondary | ICD-10-CM | POA: Diagnosis not present

## 2024-09-20 DIAGNOSIS — M5416 Radiculopathy, lumbar region: Secondary | ICD-10-CM

## 2024-09-20 DIAGNOSIS — R293 Abnormal posture: Secondary | ICD-10-CM
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
## 2024-09-23 ENCOUNTER — Telehealth: Payer: Self-pay | Admitting: Internal Medicine

## 2024-09-23 NOTE — Telephone Encounter (Unsigned)
 Copied from CRM [id number]. Topic: Appointments - Appointment Scheduling >> Sep 23, 2024  2:52 PM Donna BRAVO wrote: Patient/patient representative is calling to schedule an appointment. Refer to attachments for appointment information.  Red Word that prompted transfer to Nurse Triage: patient says had surgery 3 weeks ago, BP will be fine and the all the sudden goes high, headaches, dizziness lasts for the rest of the day.   Patient refusing to speak with nurse triage just schedule appt  ----------------------------------------------------------------------- From previous Reason for Contact - Scheduling: Patient/patient representative is calling to schedule an appointment. Refer to attachments for appointment information.  Patient calling scheduling appt for BP follow up

## 2024-09-23 NOTE — Telephone Encounter (Signed)
 Waiting on patient to get back to me

## 2024-09-24 ENCOUNTER — Ambulatory Visit

## 2024-09-24 DIAGNOSIS — M5416 Radiculopathy, lumbar region: Secondary | ICD-10-CM

## 2024-09-24 DIAGNOSIS — M6281 Muscle weakness (generalized): Secondary | ICD-10-CM

## 2024-09-24 DIAGNOSIS — R293 Abnormal posture: Secondary | ICD-10-CM

## 2024-09-24 DIAGNOSIS — M5459 Other low back pain: Secondary | ICD-10-CM | POA: Diagnosis not present

## 2024-09-24 DIAGNOSIS — R262 Difficulty in walking, not elsewhere classified: Secondary | ICD-10-CM

## 2024-09-24 NOTE — Telephone Encounter (Signed)
 Pt states he is taking metoprolol  everyday. He just started this week taking enlapril. His BP has been 170/90, 160/95. He does have an appt tomorrow with you
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
## 2024-09-25 ENCOUNTER — Ambulatory Visit (INDEPENDENT_AMBULATORY_CARE_PROVIDER_SITE_OTHER): Admitting: Medical

## 2024-09-25 ENCOUNTER — Encounter: Payer: Self-pay | Admitting: Medical

## 2024-09-25 ENCOUNTER — Other Ambulatory Visit: Payer: Self-pay | Admitting: Medical

## 2024-09-25 ENCOUNTER — Telehealth: Payer: Self-pay | Admitting: Medical

## 2024-09-25 VITALS — BP 130/84 | HR 90

## 2024-09-25 DIAGNOSIS — N529 Male erectile dysfunction, unspecified: Secondary | ICD-10-CM | POA: Insufficient documentation

## 2024-09-25 DIAGNOSIS — Z9889 Other specified postprocedural states: Secondary | ICD-10-CM | POA: Insufficient documentation

## 2024-09-25 DIAGNOSIS — R202 Paresthesia of skin: Secondary | ICD-10-CM | POA: Diagnosis not present

## 2024-09-25 DIAGNOSIS — M5410 Radiculopathy, site unspecified: Secondary | ICD-10-CM | POA: Diagnosis not present

## 2024-09-25 DIAGNOSIS — I152 Hypertension secondary to endocrine disorders: Secondary | ICD-10-CM

## 2024-09-25 DIAGNOSIS — E785 Hyperlipidemia, unspecified: Secondary | ICD-10-CM

## 2024-09-25 DIAGNOSIS — E118 Type 2 diabetes mellitus with unspecified complications: Secondary | ICD-10-CM

## 2024-09-25 DIAGNOSIS — E1159 Type 2 diabetes mellitus with other circulatory complications: Secondary | ICD-10-CM

## 2024-09-25 DIAGNOSIS — M549 Dorsalgia, unspecified: Secondary | ICD-10-CM | POA: Diagnosis not present

## 2024-09-25 DIAGNOSIS — I8393 Asymptomatic varicose veins of bilateral lower extremities: Secondary | ICD-10-CM

## 2024-09-25 MED ORDER — SILDENAFIL CITRATE 100 MG PO TABS: 50.0000 mg | ORAL_TABLET | ORAL | 0 refills | Status: DC | PRN

## 2024-09-25 NOTE — Telephone Encounter (Signed)
 I communicated with both physical therapy and his surgeon   Physical therapy is going to work with him regarding his symptoms  Surgeon felt like the erectile dysfunction should not necessarily be related to his back issues but the other sensation and numbness issues are not surprising  Thus, he can begin trial of Viagra.  I sent Viagra to the pharmacy.  He can start out with 1/2 tablet as needed 30 to 45 minutes prior to sexual activity  Do not take any more than 1 treatment of Viagra in the given 24 hours  If he tries the half tablet 50 mg a couple different days and it does not seem to help then he can try the 100 mg.  But start off with a half a tablet 50 mg dose

## 2024-09-25 NOTE — Telephone Encounter (Signed)
 I communicated with both physical therapy and his surgeon.  Therapy is going to work on his symptoms with him  The surgeon felt like the numbness and tingling was related to his issue but not necessarily the erectile dysfunction related to the back issue  Begin trial of Viagra.  You can use 1/2 tablet 30 to 45 minutes before intercourse.  Do not take more than 1 treatment in a given 24 hours.  After a few different days of use on different days if no significant response to the 50 mg then you can try 100 mg.  VIAGRA can cause serious side effects: Do not take VIAGRA (sildenafil citrate) if you now or in the future use any medicines called nitrates, often prescribed for chest pain, or Adempas (riociguat) for pulmonary hypertension. Your blood pressure could drop to an unsafe level  A rarely reported side effect could include an erection that will not go away (priapism). If you have an erection that lasts more than 1 hours, get medical help right away. If it is not treated right away, priapism can permanently damage your penis  If you have sudden vision loss in one or both eyes, this can be a sign of a serious eye problem (non-arteritic anterior ischemic optic neuropathy.  Stop taking VIAGRA and call your healthcare provider right away if you have any sudden vision loss  Call your healthcare provider right away if you have sudden hearing decreased, hearing loss or sudden ringing in the ears.  If you experience chest pain, dizziness, or nausea during sex, seek immediate medical help

## 2024-09-25 NOTE — Progress Notes (Addendum)
 Name: Andre [name]   Date of Visit: 09/25/24   Date of last visit with me: 08/30/2024   CHIEF COMPLAINT:  Chief Complaint  Patient presents with   Consult    Follow-up on back. CPAP machine is messing up- only blowing out hot air and not moist. BP has been running high.        HPI:  Discussed the use of AI scribe software for clinical note transcription with the patient, who gave verbal consent to proceed.  History of Present Illness  Andre [name] is a 43 year old male with hypertension and diabetes who presents with leg pain and numbness post-surgery.  He experiences persistent tightness in his left calf, describing it as 'tight' and requiring nightly massages. The tendons feel very tight, and this sensation has persisted since around the time he started experiencing severe back pain. Although the pain in his hip has decreased significantly, the tightness in the calf remains.  He denies swelling or bruising.  He reports numbness in his left groin area, which is slowly improving. He also notes a decrease in erectile function, stating that it 'doesn't get hard like it used to.' These symptoms began around the time he started experiencing severe back pain, which led to his surgery.  He does note having some erectile dysfunction prior to surgery but things got significantly worse recently after surgery.  No prior use of Viagra or similar medication.  After his last visit 08/30/2024 posthospitalization he was having issues with dehydration, hypotension and several medicines were held.  We gave him specific instructions on how to restart his medications after last visit  He has a history of hypertension and is currently taking Toprol -XL 100 mg daily.  Enalapril  was held initially but he just started that back this past week.  Blood pressures are jumping up around a bit sometimes high, sometimes normal.  He called into our office this past week in regards to blood  pressure being high with a little bit of a headache.  Still gets some headaches but the blood pressure is looking better  He now has resumed back on Ozempic  and he was able to start Jardiance  in place of Farxiga  from the former recommendation  He describes leg pain that has moved from his hip to below the knee, with tightness and occasional pain in different spots. His ankle hurts when touched or when walking up an incline, and the back of his knee has been hurting. The strength in his foot is returning, but the leg feels weak and tired after walking.  He mentions gaining ten pounds in the past two weeks not being as active and eating more since his recent surgery.  He is currently undergoing physical therapy twice a week and is performing exercises at home, noting that the exercises are becoming progressively more challenging.  No other aggravating or relieving factors. No other complaint.  Past Medical History:  Diagnosis Date   Bell's palsy    at 6th or 7th grade on right side    Depression    Diabetes mellitus without complication (HCC)    2017   Elevated LFTs 2017   Hypertension    Obesity    OSA (obstructive sleep apnea)    Current Outpatient Medications on File Prior to Visit  Medication Sig Dispense Refill   acetaminophen  (TYLENOL ) 500 MG tablet Take 1,000 mg by mouth every 8 (eight) hours as needed for mild pain (pain score 1-3).  aspirin  EC 81 MG tablet Take 1 tablet (81 mg total) by mouth daily. 90 tablet 3   enalapril  (VASOTEC ) 20 MG tablet Take 1 tablet (20 mg total) by mouth daily. 90 tablet 2   JARDIANCE  10 MG TABS tablet Take 10 mg by mouth every morning.     metFORMIN  (GLUCOPHAGE ) 500 MG tablet Take 1 tablet (500 mg total) by mouth daily with breakfast. 90 tablet 2   metoprolol  succinate (TOPROL  XL) 100 MG 24 hr tablet Take 1 tablet (100 mg total) by mouth daily. Take with or immediately following a meal. 90 tablet 2   rosuvastatin  (CRESTOR ) 20 MG tablet Take 1  tablet (20 mg total) by mouth daily. 90 tablet 2   Semaglutide , 2 MG/DOSE, (OZEMPIC , 2 MG/DOSE,) 8 MG/3ML SOPN DIAL AND INJECT UNDER THE SKIN 2 MG WEEKLY 3 mL 1   Blood Glucose Monitoring Suppl (ACCU-CHEK GUIDE) w/Device KIT USE TO TEST BLOOD GLUCOSE LEVELS 1-2 TIMES DAILY 1 kit 1   Glucose Blood (BLOOD GLUCOSE TEST STRIPS) STRP Test 1-2 times daily 100 strip 0   ibuprofen  (ADVIL ) 200 MG tablet Take 400-800 mg by mouth every 8 (eight) hours as needed for mild pain (pain score 1-3) or moderate pain (pain score 4-6).     Lancet Device MISC 1-2 times daily 1 each 0   Lancets Misc. MISC 1-2 times a day 100 each 0   MICROLET LANCETS MISC Test daily 100 each 11   No current facility-administered medications on file prior to visit.    ROS as in subjective   Objective: BP 130/84   Pulse 90   Gen: wd, wn, nad Psych: pleasant, good eye contact Varicose veins of lower legs particularly on the left, no particular tenderness of the left leg and no obvious asymmetry and no discoloration Slight decrease in strength of left lower leg compared to the right but pulses within normal limits Sensation within normal     Assessment: Encounter Diagnoses  Name Primary?   Back pain with history of spinal surgery Yes   Radicular leg pain    Paresthesia of saddle area    Erectile dysfunction, unspecified erectile dysfunction type    Hyperlipidemia, unspecified hyperlipidemia type    Hypertension associated with diabetes (HCC)    Type 2 diabetes with complication (HCC)    Varicose veins of both lower extremities, unspecified whether complicated      Plan: Persistent lower extremity symptoms post back surgery Persistent symptoms including pain, paresthesia, and muscle weakness post-surgery - Consult with physical therapist and surgeon regarding symptoms - Instruct him to inform physical therapist and surgeon about symptoms upon follow up - Communicate with surgeon regarding symptoms. -continue  physical therapy, stretcihng -he declines labs today.  Advised if any worsening of his left leg symptoms particular with the We may end up needing to do left leg ultrasound or D-dimer and electrolytes.  He had such a traumatic experience of blood draws in the hospital that he declines lab draw at this time   Erectile dysfunction, likely combination of underlying health issues with HTN, diabetes, hyperlipidemia, smoker, but possible neurogenic component as well given recent severe back pain and subsequent surgery Erectile dysfunction likely neurogenic post-surgery. Risk of priapism with medications like Viagra. - Consult with surgeon  - Consider prescribing Viagra trial.  Discussed Viagra, risk and benefits and proper use of medication  Hypertension Hypertension with recent fluctuations.  Just recently within the past week started back on enalapril .   - Continue Toprol -XL 100  mg daily -Continue recently started back enalapril  20 mg daily - Monitor blood pressure at home and report changes.  Type 2 diabetes mellitus -Continue metformin  500 mg daily and Jardiance  10 mg daily and Ozempic  2 mg weekly  Hyperlipidemia -continue aspirin  81 mg daily and rosuvastatin  20 mg daily  Varicose veins of lower extremity Varicose veins present. - Advise use of knee-high compression socks.     Yareth Macdonnell was seen today for consult.  Diagnoses and all orders for this visit:  Back pain with history of spinal surgery  Radicular leg pain  Paresthesia of saddle area  Erectile dysfunction, unspecified erectile dysfunction type  Hyperlipidemia, unspecified hyperlipidemia type  Hypertension associated with diabetes (HCC)  Type 2 diabetes with complication (HCC)  Varicose veins of both lower extremities, unspecified whether complicated    F/u pending call back

## 2024-09-26 NOTE — Telephone Encounter (Signed)
 Pt was notified.

## 2024-09-27 ENCOUNTER — Ambulatory Visit: Attending: Internal Medicine

## 2024-09-27 DIAGNOSIS — M6281 Muscle weakness (generalized): Secondary | ICD-10-CM | POA: Insufficient documentation

## 2024-09-27 DIAGNOSIS — R293 Abnormal posture: Secondary | ICD-10-CM | POA: Diagnosis present

## 2024-09-27 DIAGNOSIS — R262 Difficulty in walking, not elsewhere classified: Secondary | ICD-10-CM | POA: Diagnosis present

## 2024-09-27 DIAGNOSIS — M5459 Other low back pain: Secondary | ICD-10-CM | POA: Diagnosis present

## 2024-09-27 DIAGNOSIS — M5416 Radiculopathy, lumbar region: Secondary | ICD-10-CM | POA: Insufficient documentation
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
## 2024-09-30 ENCOUNTER — Telehealth: Payer: Self-pay | Admitting: Internal Medicine

## 2024-09-30 DIAGNOSIS — G4733 Obstructive sleep apnea (adult) (pediatric): Secondary | ICD-10-CM

## 2024-09-30 NOTE — Telephone Encounter (Signed)
 Patient forgot to mention to you during the visit about his CPAP not working right. The pressure isn't coming out right and he is wanting to get a new CPAP if possible. Looks like he has only had it almost 3 years

## 2024-09-30 NOTE — Telephone Encounter (Signed)
 Placed new order to [employer] since its not working right

## 2024-10-01 ENCOUNTER — Ambulatory Visit: Admitting: Rehabilitation

## 2024-10-01 ENCOUNTER — Encounter: Payer: Self-pay | Admitting: Rehabilitation

## 2024-10-01 DIAGNOSIS — R262 Difficulty in walking, not elsewhere classified: Secondary | ICD-10-CM

## 2024-10-01 DIAGNOSIS — M5416 Radiculopathy, lumbar region: Secondary | ICD-10-CM

## 2024-10-01 DIAGNOSIS — M6281 Muscle weakness (generalized): Secondary | ICD-10-CM

## 2024-10-01 DIAGNOSIS — R293 Abnormal posture: Secondary | ICD-10-CM

## 2024-10-01 DIAGNOSIS — M5459 Other low back pain: Secondary | ICD-10-CM
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
## 2024-10-04 ENCOUNTER — Ambulatory Visit

## 2024-10-04 DIAGNOSIS — M5459 Other low back pain: Secondary | ICD-10-CM

## 2024-10-04 DIAGNOSIS — R293 Abnormal posture: Secondary | ICD-10-CM

## 2024-10-04 DIAGNOSIS — M6281 Muscle weakness (generalized): Secondary | ICD-10-CM

## 2024-10-04 DIAGNOSIS — M5416 Radiculopathy, lumbar region: Secondary | ICD-10-CM

## 2024-10-04 DIAGNOSIS — R262 Difficulty in walking, not elsewhere classified: Secondary | ICD-10-CM
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
## 2024-10-07 ENCOUNTER — Telehealth: Payer: Self-pay | Admitting: Internal Medicine

## 2024-10-07 NOTE — Telephone Encounter (Signed)
 Patient contacted stating that the ED is not working for him. He has taken 1mg  and isn't helping. Pt wants to know can he get something else.    Patient also news new rx for Cpap machine and supplies. (I went ahead and placed this on 10/6 when patient was having issues with machine) I have reached back out to [facility] with [employer] to get process started for a new machine.

## 2024-10-08 ENCOUNTER — Telehealth: Payer: Self-pay | Admitting: Medical

## 2024-10-08 ENCOUNTER — Other Ambulatory Visit: Payer: Self-pay | Admitting: Medical

## 2024-10-08 ENCOUNTER — Ambulatory Visit

## 2024-10-08 DIAGNOSIS — M5416 Radiculopathy, lumbar region: Secondary | ICD-10-CM | POA: Diagnosis not present

## 2024-10-08 DIAGNOSIS — M6281 Muscle weakness (generalized): Secondary | ICD-10-CM

## 2024-10-08 DIAGNOSIS — R293 Abnormal posture: Secondary | ICD-10-CM

## 2024-10-08 DIAGNOSIS — R262 Difficulty in walking, not elsewhere classified: Secondary | ICD-10-CM

## 2024-10-08 DIAGNOSIS — M5459 Other low back pain: Secondary | ICD-10-CM

## 2024-10-08 MED ORDER — TADALAFIL 5 MG PO TABS: 5.0000 mg | ORAL_TABLET | Freq: Every day | ORAL | 1 refills | Status: DC

## 2024-10-08 MED ORDER — TADALAFIL 5 MG PO TABS: 5.0000 mg | ORAL_TABLET | Freq: Every day | ORAL | 1 refills | Status: DC | PRN
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
## 2024-10-08 NOTE — Telephone Encounter (Signed)
 Pt dropped off Continuing disability claim form for Andre [name] to complete, states he needs one completed by Andre [name] & by back doctor, form in your folder

## 2024-10-08 NOTE — Telephone Encounter (Signed)
 Pt was notified.

## 2024-10-08 NOTE — Addendum Note (Signed)
 Addended by: VICCI HUSBAND A on: 10/08/2024 12:14 PM   Modules accepted: Orders

## 2024-10-08 NOTE — Telephone Encounter (Signed)
 Pt has tried this on 7 different occasions and no help at all

## 2024-10-11 ENCOUNTER — Encounter: Payer: Self-pay | Admitting: Rehabilitation

## 2024-10-11 ENCOUNTER — Ambulatory Visit: Admitting: Rehabilitation

## 2024-10-11 DIAGNOSIS — R262 Difficulty in walking, not elsewhere classified: Secondary | ICD-10-CM

## 2024-10-11 DIAGNOSIS — R293 Abnormal posture: Secondary | ICD-10-CM

## 2024-10-11 DIAGNOSIS — M5416 Radiculopathy, lumbar region: Secondary | ICD-10-CM

## 2024-10-11 DIAGNOSIS — M5459 Other low back pain: Secondary | ICD-10-CM

## 2024-10-11 DIAGNOSIS — M6281 Muscle weakness (generalized): Secondary | ICD-10-CM

## 2024-10-11 NOTE — Telephone Encounter (Signed)
 I have faxed form back and stated that patient is being followed by neurosurgery and they would determine when he can return to work, as we can not
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
## 2024-10-17 ENCOUNTER — Ambulatory Visit: Payer: Self-pay

## 2024-10-17 DIAGNOSIS — R293 Abnormal posture: Secondary | ICD-10-CM

## 2024-10-17 DIAGNOSIS — M5416 Radiculopathy, lumbar region: Secondary | ICD-10-CM

## 2024-10-17 DIAGNOSIS — M5459 Other low back pain: Secondary | ICD-10-CM

## 2024-10-17 DIAGNOSIS — R262 Difficulty in walking, not elsewhere classified: Secondary | ICD-10-CM

## 2024-10-17 DIAGNOSIS — M6281 Muscle weakness (generalized): Secondary | ICD-10-CM
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
## 2024-10-18 ENCOUNTER — Telehealth: Payer: Self-pay | Admitting: Internal Medicine

## 2024-10-18 DIAGNOSIS — G4733 Obstructive sleep apnea (adult) (pediatric): Secondary | ICD-10-CM

## 2024-10-18 NOTE — Telephone Encounter (Signed)
 Cialis daily for ED

## 2024-10-18 NOTE — Telephone Encounter (Signed)
 Patient states that meds are not working. Any other options

## 2024-10-21 ENCOUNTER — Other Ambulatory Visit: Payer: Self-pay | Admitting: Medical

## 2024-10-21 DIAGNOSIS — N529 Male erectile dysfunction, unspecified: Secondary | ICD-10-CM

## 2024-10-22 ENCOUNTER — Encounter: Payer: Self-pay | Admitting: Rehabilitation

## 2024-10-22 ENCOUNTER — Ambulatory Visit: Admitting: Rehabilitation

## 2024-10-22 DIAGNOSIS — M6281 Muscle weakness (generalized): Secondary | ICD-10-CM

## 2024-10-22 DIAGNOSIS — R293 Abnormal posture: Secondary | ICD-10-CM

## 2024-10-22 DIAGNOSIS — M5459 Other low back pain: Secondary | ICD-10-CM

## 2024-10-22 DIAGNOSIS — M5416 Radiculopathy, lumbar region: Secondary | ICD-10-CM | POA: Diagnosis not present

## 2024-10-22 DIAGNOSIS — R262 Difficulty in walking, not elsewhere classified: Secondary | ICD-10-CM
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
## 2024-10-22 NOTE — Telephone Encounter (Signed)
 I have placed referral in to [facility] sleep center

## 2024-10-22 NOTE — Telephone Encounter (Signed)
 Pt would like to have a new sleep study done. Is this ok? He is trying to get a new machine

## 2024-10-25 ENCOUNTER — Ambulatory Visit

## 2024-10-28 ENCOUNTER — Encounter: Payer: Self-pay | Admitting: Radiology

## 2024-10-29 ENCOUNTER — Ambulatory Visit: Attending: Internal Medicine

## 2024-10-29 DIAGNOSIS — R262 Difficulty in walking, not elsewhere classified: Secondary | ICD-10-CM | POA: Diagnosis present

## 2024-10-29 DIAGNOSIS — M5416 Radiculopathy, lumbar region: Secondary | ICD-10-CM | POA: Insufficient documentation

## 2024-10-29 DIAGNOSIS — M5459 Other low back pain: Secondary | ICD-10-CM | POA: Insufficient documentation

## 2024-10-29 DIAGNOSIS — R293 Abnormal posture: Secondary | ICD-10-CM | POA: Diagnosis present

## 2024-10-29 DIAGNOSIS — M6281 Muscle weakness (generalized): Secondary | ICD-10-CM | POA: Diagnosis present
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
## 2024-11-01 ENCOUNTER — Ambulatory Visit: Admitting: Rehabilitation

## 2024-11-05 ENCOUNTER — Encounter: Payer: Self-pay | Admitting: Medical

## 2024-11-05 ENCOUNTER — Ambulatory Visit

## 2024-11-05 ENCOUNTER — Ambulatory Visit: Payer: Self-pay | Admitting: Medical

## 2024-11-05 VITALS — BP 130/80 | HR 85 | Ht 75.0 in | Wt 269.8 lb

## 2024-11-05 DIAGNOSIS — E118 Type 2 diabetes mellitus with unspecified complications: Secondary | ICD-10-CM | POA: Diagnosis not present

## 2024-11-05 DIAGNOSIS — E1159 Type 2 diabetes mellitus with other circulatory complications: Secondary | ICD-10-CM | POA: Diagnosis not present

## 2024-11-05 DIAGNOSIS — I152 Hypertension secondary to endocrine disorders: Secondary | ICD-10-CM

## 2024-11-05 DIAGNOSIS — Z7185 Encounter for immunization safety counseling: Secondary | ICD-10-CM

## 2024-11-05 DIAGNOSIS — N529 Male erectile dysfunction, unspecified: Secondary | ICD-10-CM

## 2024-11-05 DIAGNOSIS — E785 Hyperlipidemia, unspecified: Secondary | ICD-10-CM

## 2024-11-05 DIAGNOSIS — F17200 Nicotine dependence, unspecified, uncomplicated: Secondary | ICD-10-CM

## 2024-11-05 DIAGNOSIS — Z7985 Long-term (current) use of injectable non-insulin antidiabetic drugs: Secondary | ICD-10-CM

## 2024-11-05 LAB — LIPID PANEL

## 2024-11-05 MED ORDER — TADALAFIL 20 MG PO TABS: 10.0000 mg | ORAL_TABLET | ORAL | 2 refills | Status: AC | PRN

## 2024-11-05 NOTE — Progress Notes (Signed)
 Subjective:  Andre [name] is a 43 y.o. male who presents for Chief Complaint  Patient presents with   Hypertension    Fasting med check, no new concerns.      Here for fasting med check  Diabetes-compliant with with metformin  500 mg daily, semaglutide  2 mg weekly, Jardiance  10 mg daily.  Not checking.   Hyperlipidemia-compliant with rosuvastatin  Crestor  20 mg daily and aspirin  81 mg daily.   But cut aspirin  out a week ago due to dropping BP too low.   Hypertension-compliant with Toprol -XL 100 mg daily and enalapril  20 mg daily.   Has had some BPs low recently, 105 SBP, 50-60s DBP.   After stopping aspirin , BP improved.   Been having problems with erectile issues not improved with Cialis and Viagra. referral was made to urology.  Appointment still getting worked out.  Status post severe back pain with emergency surgery for herniation, lumbar spine herniated disc back in September 2025  He notes knotty feeling in left arm veins since being in the hospital in 08/2024  Still smoking.  No other aggravating or relieving factors.    No other c/o.  Past Medical History:  Diagnosis Date   Bell's palsy    at 6th or 7th grade on right side    Depression    Diabetes mellitus without complication (HCC)    2017   Elevated LFTs 2017   Hypertension    Obesity    OSA (obstructive sleep apnea)    Current Outpatient Medications on File Prior to Visit  Medication Sig Dispense Refill   acetaminophen  (TYLENOL ) 500 MG tablet Take 1,000 mg by mouth every 8 (eight) hours as needed for mild pain (pain score 1-3).     Blood Glucose Monitoring Suppl (ACCU-CHEK GUIDE) w/Device KIT USE TO TEST BLOOD GLUCOSE LEVELS 1-2 TIMES DAILY 1 kit 1   enalapril  (VASOTEC ) 20 MG tablet Take 1 tablet (20 mg total) by mouth daily. 90 tablet 2   Glucose Blood (BLOOD GLUCOSE TEST STRIPS) STRP Test 1-2 times daily 100 strip 0   ibuprofen  (ADVIL ) 200 MG tablet Take 400-800 mg by mouth every 8 (eight) hours as  needed for mild pain (pain score 1-3) or moderate pain (pain score 4-6).     JARDIANCE  10 MG TABS tablet Take 10 mg by mouth every morning.     Lancet Device MISC 1-2 times daily 1 each 0   Lancets Misc. MISC 1-2 times a day 100 each 0   metFORMIN  (GLUCOPHAGE ) 500 MG tablet Take 1 tablet (500 mg total) by mouth daily with breakfast. 90 tablet 2   metoprolol  succinate (TOPROL  XL) 100 MG 24 hr tablet Take 1 tablet (100 mg total) by mouth daily. Take with or immediately following a meal. 90 tablet 2   MICROLET LANCETS MISC Test daily 100 each 11   rosuvastatin  (CRESTOR ) 20 MG tablet Take 1 tablet (20 mg total) by mouth daily. 90 tablet 2   Semaglutide , 2 MG/DOSE, (OZEMPIC , 2 MG/DOSE,) 8 MG/3ML SOPN DIAL AND INJECT UNDER THE SKIN 2 MG WEEKLY 3 mL 1   No current facility-administered medications on file prior to visit.   Past Surgical History:  Procedure Laterality Date   CERVICAL FUSION     LUMBAR LAMINECTOMY/DECOMPRESSION MICRODISCECTOMY Left 08/27/2024   Procedure: Minimally Invasive Left Lumbar five-Sacral one Microdiscectomy;  Surgeon: [name] Dorn SAUNDERS, MD;  Location: [facility] OR;  Service: Neurosurgery;  Laterality: Left;   SKIN GRAFT Right    below right knee, medial  The following portions of the patient's history were reviewed and updated as appropriate: allergies, current medications, past family history, past medical history, past social history, past surgical history and problem list.  ROS Otherwise as in subjective above  Objective: BP 130/80   Pulse 85   Ht 6' 3 (1.905 m)   Wt 269 lb 12.8 oz (122.4 kg)   BMI 33.72 kg/m   BP Readings from Last 3 Encounters:  11/05/24 130/80  09/25/24 130/84  08/30/24 122/80   Wt Readings from Last 3 Encounters:  11/05/24 269 lb 12.8 oz (122.4 kg)  08/25/24 253 lb 1.4 oz (114.8 kg)  08/22/24 263 lb (119.3 kg)    General appearance: alert, no distress, well developed, well nourished Neck: supple, no lymphadenopathy, no thyromegaly,  no masses Heart: RRR, normal S1, S2, no murmurs Lungs: CTA bilaterally, no wheezes, rhonchi, or rales Pulses: 2+ radial pulses, 2+ pedal pulses, normal cap refill Ext: no edema Left forearm with a somewhat fullness of the cephalic vein, valve palpated    Assessment: Encounter Diagnoses  Name Primary?   Type 2 diabetes with complication (HCC) Yes   Hyperlipidemia, unspecified hyperlipidemia type    Hypertension associated with diabetes (HCC)    Vaccine counseling    Smoker    Erectile dysfunction, unspecified erectile dysfunction type      Plan: Type 2 diabetes mellitus with unspecified complications Blood sugar levels not recently monitored. Last A1c in September; too early for repeat. Sugar markers stable. - Ordered basic metabolic panel and microalbumin test. - Continue Jardiance  10 mg daily, metformin  500 mg daily, Ozempic  semaglutide  2 mg weekly  Hyperlipidemia Cholesterol levels due for re-evaluation. - Ordered cholesterol test. -Continue rosuvastatin  Crestor  20 mg daily  Essential hypertension Blood pressure stabilized at 130/80 mmHg after stopping aspirin . Current medications: Toprol  XL 100 mg daily, enalapril  20 mg daily - Monitor blood pressure regularly. - Discuss with pharmacy regarding 90-day supply of medications.  Smoker Continues to smoke, not really motivated to quit  Male erectile dysfunction Using 5 mg Cialis daily without significant improvement.  Change to 20 mg as needed for erectile dysfunction. - Prescribed 20 mg Cialis as needed for erectile dysfunction.  Phlebitis of upper extremity (post-IV) Persistent knot at previous IV site, likely phlebitis. No significant pain. Discussed heat and aspirin  treatment;  - Apply heat to the affected area twice daily for a week.   Andre [name] was seen today for hypertension.  Diagnoses and all orders for this visit:  Type 2 diabetes with complication (HCC) -     Basic metabolic panel with GFR -      Microalbumin/Creatinine Ratio, Urine  Hyperlipidemia, unspecified hyperlipidemia type -     Lipid panel  Hypertension associated with diabetes (HCC) -     CBC -     Basic metabolic panel with GFR  Vaccine counseling  Smoker  Erectile dysfunction, unspecified erectile dysfunction type -     Testosterone  Other orders -     tadalafil (CIALIS) 20 MG tablet; Take 0.5-1 tablets (10-20 mg total) by mouth every other day as needed for erectile dysfunction.   Follow up: pending labs

## 2024-11-06 ENCOUNTER — Other Ambulatory Visit: Payer: Self-pay | Admitting: Medical

## 2024-11-06 ENCOUNTER — Ambulatory Visit: Payer: Self-pay | Admitting: Medical

## 2024-11-06 LAB — BASIC METABOLIC PANEL WITH GFR
BUN/Creatinine Ratio: 11 (ref 9–20)
BUN: 12 mg/dL (ref 6–24)
CO2: 23 mmol/L (ref 20–29)
Calcium: 9.2 mg/dL (ref 8.7–10.2)
Chloride: 101 mmol/L (ref 96–106)
Creatinine, Ser: 1.08 mg/dL (ref 0.76–1.27)
Glucose: 128 mg/dL — AB (ref 70–99)
Potassium: 4.5 mmol/L (ref 3.5–5.2)
Sodium: 138 mmol/L (ref 134–144)
eGFR: 87 mL/min/1.73 (ref >59)

## 2024-11-06 LAB — LIPID PANEL
Cholesterol, Total: 120 mg/dL (ref 100–199)
HDL: 40 mg/dL (ref >39)
LDL CALC COMMENT:: 3 ratio (ref 0.0–5.0)
LDL Chol Calc (NIH): 64 mg/dL (ref 0–99)
Triglycerides: 77 mg/dL (ref 0–149)
VLDL Cholesterol Cal: 16 mg/dL (ref 5–40)

## 2024-11-06 LAB — MICROALBUMIN / CREATININE URINE RATIO
Creatinine, Urine: 126.1 mg/dL
Microalb/Creat Ratio: 8 mg/g{creat} (ref 0–29)
Microalbumin, Urine: 10.4 ug/mL

## 2024-11-06 LAB — TESTOSTERONE: Testosterone: 448 ng/dL (ref 264–916)

## 2024-11-06 LAB — CBC
Hematocrit: 50.2 % (ref 37.5–51.0)
Hemoglobin: 17 g/dL (ref 13.0–17.7)
MCH: 32 pg (ref 26.6–33.0)
MCHC: 33.9 g/dL (ref 31.5–35.7)
MCV: 95 fL (ref 79–97)
Platelets: 214 x10E3/uL (ref 150–450)
RBC: 5.31 x10E6/uL (ref 4.14–5.80)
RDW: 12.4 % (ref 11.6–15.4)
WBC: 6.5 x10E3/uL (ref 3.4–10.8)

## 2024-11-06 MED ORDER — OZEMPIC (2 MG/DOSE) 8 MG/3ML ~~LOC~~ SOPN: 2.0000 mg | PEN_INJECTOR | SUBCUTANEOUS | 5 refills | Status: AC

## 2024-11-06 NOTE — Progress Notes (Signed)
 Follow up in 6 months

## 2024-11-06 NOTE — Progress Notes (Signed)
 Results through MyChart

## 2024-11-07 ENCOUNTER — Encounter: Payer: Self-pay | Admitting: Rehabilitation

## 2024-11-07 ENCOUNTER — Ambulatory Visit: Admitting: Rehabilitation

## 2024-11-07 DIAGNOSIS — M5459 Other low back pain: Secondary | ICD-10-CM

## 2024-11-07 DIAGNOSIS — M5416 Radiculopathy, lumbar region: Secondary | ICD-10-CM

## 2024-11-07 DIAGNOSIS — R262 Difficulty in walking, not elsewhere classified: Secondary | ICD-10-CM

## 2024-11-07 DIAGNOSIS — R293 Abnormal posture: Secondary | ICD-10-CM

## 2024-11-07 DIAGNOSIS — M6281 Muscle weakness (generalized): Secondary | ICD-10-CM
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
## 2024-11-08 ENCOUNTER — Ambulatory Visit: Admitting: Rehabilitation

## 2024-11-12 ENCOUNTER — Ambulatory Visit: Admitting: Rehabilitation

## 2024-11-12 DIAGNOSIS — M5416 Radiculopathy, lumbar region: Secondary | ICD-10-CM

## 2024-11-12 DIAGNOSIS — R262 Difficulty in walking, not elsewhere classified: Secondary | ICD-10-CM

## 2024-11-12 DIAGNOSIS — M6281 Muscle weakness (generalized): Secondary | ICD-10-CM

## 2024-11-12 DIAGNOSIS — M5459 Other low back pain: Secondary | ICD-10-CM

## 2024-11-12 DIAGNOSIS — R293 Abnormal posture: Secondary | ICD-10-CM
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
## 2024-11-27 ENCOUNTER — Ambulatory Visit: Admitting: Urology

## 2024-11-27 ENCOUNTER — Encounter: Payer: Self-pay | Admitting: Internal Medicine

## 2024-11-27 ENCOUNTER — Ambulatory Visit: Admitting: Medical

## 2024-11-27 ENCOUNTER — Encounter: Payer: Self-pay | Admitting: Urology

## 2024-11-27 VITALS — BP 164/102 | HR 87 | Ht 75.0 in | Wt 270.0 lb

## 2024-11-27 VITALS — BP 132/76 | HR 87 | Wt 272.8 lb

## 2024-11-27 DIAGNOSIS — M549 Dorsalgia, unspecified: Secondary | ICD-10-CM | POA: Diagnosis not present

## 2024-11-27 DIAGNOSIS — Z9889 Other specified postprocedural states: Secondary | ICD-10-CM

## 2024-11-27 DIAGNOSIS — N529 Male erectile dysfunction, unspecified: Secondary | ICD-10-CM | POA: Diagnosis not present

## 2024-11-27 DIAGNOSIS — M48061 Spinal stenosis, lumbar region without neurogenic claudication: Secondary | ICD-10-CM | POA: Diagnosis not present

## 2024-11-27 DIAGNOSIS — M5410 Radiculopathy, site unspecified: Secondary | ICD-10-CM | POA: Diagnosis not present

## 2024-11-27 DIAGNOSIS — R29898 Other symptoms and signs involving the musculoskeletal system: Secondary | ICD-10-CM | POA: Diagnosis not present

## 2024-11-27 DIAGNOSIS — R200 Anesthesia of skin: Secondary | ICD-10-CM | POA: Diagnosis not present

## 2024-11-27 MED ORDER — METOPROLOL SUCCINATE ER 50 MG PO TB24: 50.0000 mg | ORAL_TABLET | Freq: Every day | ORAL | 1 refills | Status: AC

## 2024-11-27 MED ORDER — AMLODIPINE BESYLATE 5 MG PO TABS: 5.0000 mg | ORAL_TABLET | Freq: Every day | ORAL | 1 refills | Status: DC

## 2024-11-27 NOTE — Progress Notes (Signed)
 Subjective:  Jayson Waterhouse is a 43 y.o. male who presents for Chief Complaint  Patient presents with   Acute Visit    Discuss back issues. 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 Doesn't feel ready. Still numbness, back pain, tingling, doesn't think he can perform his daily work duties neuro said there is nothing left for him to do.      Here for follow-up on leg weakness, back pain.  He was hospitalized back in September 2025 with intractable back pain, saddle anesthesia, weakness and had emergency back surgery due to left lateral recess L5-S1 disc herniation  He continues in physical therapy currently.  He is concerned as he has ongoing problems including ongoing low back pain, left leg weakness, left leg numbness and tingling, scrotal and genital tingling and erectile dysfunction.  Many of the symptoms started acutely back in September before having to have emergency surgery although the erectile dysfunction symptoms started after surgery  He does feel like he is improving slowly but is not able to go back to work yet.  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  His job requires him climbing in and out of heavy equipment which he cannot do currently.  He worries about falling and leg giving out on him.  His job does not have light duty and they do not want him coming back to work unless he is at 100%  He has had a couple different follow-ups with the surgeon Dr. Dorn Glade.  At his most recent visit with a surgeon he was told that there was not the more they could offer and to follow-up with his primary care.  They advised that his symptoms will gradually improve over time but they could not give a definite timeframe and they were not going to work with him without documentation for time out of work.  He is frustrated with the neurosurgery office as they do not have more to offer  He is written out of work until 12/09/2024.  He thinks he needs at least a  month or more to get to the point where his leg is stronger refills like he can safely do his job  His main concern is the leg weakness.  He saw urology recently as well because of the ongoing saddle anesthesia and erectile dysfunction.  They advised that things will have to gradually improve over time.  He does see some improvement in this area as well but not near back to 100%  His work requires climbing, bending, twisting and he is unable to do those things at this time.  No other aggravating or relieving factors.    No other c/o.  The following portions of the patient's history were reviewed and updated as appropriate: allergies, current medications, past family history, past medical history, past social history, past surgical history and problem list.  ROS Otherwise as in subjective above  Objective: BP 132/76   Pulse 87   Wt 272 lb 12.8 oz (123.7 kg)   SpO2 97%   BMI 34.10 kg/m   General appearance: alert, no distress, well developed, well nourished      Assessment: Encounter Diagnoses  Name Primary?   Back pain with history of spinal surgery Yes   Idiopathic stenosis of lumbar spine    Radicular leg pain    Numbness in left leg    Saddle anesthesia    Erectile dysfunction, unspecified erectile dysfunction type    Weakness of left lower extremity  Plan: We discussed his symptoms, weakness, and paresthesias as well as his erectile dysfunction and saddle anesthesia concerns.  His biggest limiting factor for getting back to work is left leg weakness and ongoing back weakness and pain.  I reviewed 10/07/24 neurosurgery note.  He had a L5-S1 microdiscectomy for large herniated disc.  As of the October 2025 no surgery note noted that he had severe visible nerve root damage from the disc and resultant CSF leak upon this removal.  I do not have the most recent office notes from neurosurgery  Note given out of work 12/09/24 - 01/25/25 to continue PT and home exercises  for gradual improvement in function and strength.  He is gradually start to see improvements in the left leg weakness and the genital symptoms and erectile dysfunction symptoms. Sees urology.  He notes that neurosurgery has discharged him as there is nothing more they can do.  Consider consult with ortho or physical medicine specialist. I will reach out to PT to discuss his progress.   Coreon Simkins was seen today for acute visit.  Diagnoses and all orders for this visit:  Back pain with history of spinal surgery  Idiopathic stenosis of lumbar spine  Radicular leg pain  Numbness in left leg  Saddle anesthesia  Erectile dysfunction, unspecified erectile dysfunction type  Weakness of left lower extremity  Other orders -     amLODipine (NORVASC) 5 MG tablet; Take 1 tablet (5 mg total) by mouth daily. -     metoprolol  succinate (TOPROL  XL) 50 MG 24 hr tablet; Take 1 tablet (50 mg total) by mouth daily. Take with or immediately following a meal.    Follow up: pending call back

## 2024-11-27 NOTE — Progress Notes (Signed)
 Wrote letter and pt was advised

## 2024-11-27 NOTE — Progress Notes (Signed)
 Assessment: 1. Organic impotence     Plan: I personally reviewed the patient's chart including provider notes, lab results. Today I had a long discussion with the patient spending a total of 20 minutes discussing ED.  I discussed the pathophysiology, etiology, and natural history of ED. I advised him that he has multiple risk factors for erectile dysfunction including hypertension, hyperlipidemia, diabetes, tobacco use, beta-blocker use, and lumbar disc disease.  I discussed management options using a goal-oriented approach with the following options: medical therapy, vacuum erection device, penile injections, intraurethral suppository therapy (MUSE), and penile prosthesis.   I recommended that he consider smoking cessation.  I advised him that if his worsened erectile dysfunction is secondary to his recent lumbar disc surgery this may take time to resolve. Patient educational materials concerning these options were given to the patient.  He is not interested in any additional treatment at this time. Return prn   Chief Complaint:  Chief Complaint  Patient presents with   Erectile Dysfunction    History of Present Illness:  Andre [name] is a 43 y.o. male who is seen in consultation from Bulah Alm RAMAN, PA-C for evaluation of erectile dysfunction. He has a history of difficulty achieving and maintaining his erections.  He has had the symptoms for several years with some gradual worsening.  His erectile dysfunction significantly worsened after surgery for a herniated L5-S1 disc 3 months ago.  He has had ongoing numbness in the lower abdomen and left thigh since the surgery.  He reports that he is able to achieve a partial erection with 40-50% rigidity which is not adequate for intercourse.  He has difficulty with ejaculation.  He does have occasional nocturnal and morning erections.  No pain or curvature with erection.  He has previously tried Viagra  and Cialis  without  benefit. Recent testosterone  from 11/25: 448  Risk factors for erectile dysfunction include diabetes (poorly controlled), hypertension, hyperlipidemia, beta-blocker use, tobacco use and lumbar disc disease.   Past Medical History:  Past Medical History:  Diagnosis Date   Bell's palsy    at 6th or 7th grade on right side    Depression    Diabetes mellitus without complication (HCC)    2017   Elevated LFTs 2017   Hypertension    Obesity    OSA (obstructive sleep apnea)     Past Surgical History:  Past Surgical History:  Procedure Laterality Date   CERVICAL FUSION     LUMBAR LAMINECTOMY/DECOMPRESSION MICRODISCECTOMY Left 08/27/2024   Procedure: Minimally Invasive Left Lumbar five-Sacral one Microdiscectomy;  Surgeon: [name] Dorn SAUNDERS, MD;  Location: [facility] OR;  Service: Neurosurgery;  Laterality: Left;   SKIN GRAFT Right    below right knee, medial     Allergies:  Allergies  Allergen Reactions   Other Other (See Comments)    Rybelsus  - constipation, hemorrhoids   Influenza Vaccines Other (See Comments)    Illness x last 3 vaccines   Tramadol    Janumet  [Sitagliptin  Phos-Metformin  Hcl] Nausea And Vomiting   Morphine  And Codeine Diarrhea and Rash    itching    Family History:  Family History  Problem Relation Age of Onset   Heart disease Mother 31   Diabetes Mother    Diabetes Father        IDDM, amputation   Kidney disease Father        kidney transplant   Cancer Father    Heart disease Sister        congenital  Social History:  Social History   Tobacco Use   Smoking status: Every Day    Current packs/day: 2.00    Types: Cigarettes   Smokeless tobacco: Never  Vaping Use   Vaping status: Never Used  Substance Use Topics   Alcohol use: No   Drug use: No    Review of symptoms:  Constitutional:  Negative for unexplained weight loss, night sweats, fever, chills ENT:  Negative for nose bleeds, sinus pain, painful swallowing CV:  Negative for chest pain,  shortness of breath, exercise intolerance, palpitations, loss of consciousness Resp:  Negative for cough, wheezing, shortness of breath GI:  Negative for nausea, vomiting, diarrhea, bloody stools GU:  Positives noted in HPI; otherwise negative for gross hematuria, dysuria, urinary incontinence Neuro:  Negative for seizures, poor balance, limb weakness, slurred speech Psych:  Negative for lack of energy, depression, anxiety Endocrine:  Negative for polydipsia, polyuria, symptoms of hypoglycemia (dizziness, hunger, sweating) Hematologic:  Negative for anemia, purpura, petechia, prolonged or excessive bleeding, use of anticoagulants  Allergic:  Negative for difficulty breathing or choking as a result of exposure to anything; no shellfish allergy; no allergic response (rash/itch) to materials, foods  Physical exam: BP (!) 164/102   Pulse 87   Ht 6' 3 (1.905 m)   Wt 270 lb (122.5 kg)   BMI 33.75 kg/m  GENERAL APPEARANCE:  Well appearing, well developed, well nourished, NAD HEENT: Atraumatic, Normocephalic, oropharynx clear. NECK: Supple without lymphadenopathy or thyromegaly. LUNGS: Clear to auscultation bilaterally. HEART: Regular Rate and Rhythm without murmurs, gallops, or rubs. ABDOMEN: Soft, non-tender, No Masses. EXTREMITIES: Moves all extremities well.  Without clubbing, cyanosis, or edema. NEUROLOGIC:  Alert and oriented x 3, normal gait, CN II-XII grossly intact.  MENTAL STATUS:  Appropriate. BACK:  Non-tender to palpation.  No CVAT SKIN:  Warm, dry and intact.   GU: Penis:  circumcised Meatus: Normal Scrotum: normal, no masses Testis: normal without masses bilateral   Results: None

## 2024-12-04 ENCOUNTER — Telehealth: Payer: Self-pay | Admitting: Internal Medicine

## 2024-12-04 NOTE — Telephone Encounter (Signed)
 Dropped off a form for disability. I have completed this out and just need a signature

## 2024-12-04 NOTE — Telephone Encounter (Signed)
 Pt was notified that form is ready. Placed in box up front for pick up

## 2024-12-09 NOTE — Addendum Note (Signed)
 Addended by: VICCI HUSBAND A on: 12/09/2024 10:27 AM   Modules accepted: Orders

## 2024-12-09 NOTE — Progress Notes (Signed)
 Pt is ok with continuing PT. I have placed new referral for PT

## 2024-12-12 ENCOUNTER — Telehealth: Payer: Self-pay | Admitting: Rehabilitation

## 2024-12-12 ENCOUNTER — Ambulatory Visit: Admitting: Rehabilitation

## 2024-12-12 NOTE — Telephone Encounter (Signed)
 Received a message from [facility] ICU to cancel patient's PT appointment for today.   They report the patient's 44 y/o son is in ICU and he cannot leave to come in for PT visit.

## 2024-12-25 ENCOUNTER — Telehealth: Payer: Self-pay | Admitting: Internal Medicine

## 2024-12-25 MED ORDER — OSELTAMIVIR PHOSPHATE 75 MG PO CAPS: 75.0000 mg | ORAL_CAPSULE | Freq: Two times a day (BID) | ORAL | 0 refills | Status: DC

## 2024-12-25 NOTE — Telephone Encounter (Signed)
 Pt contacted me and would like a rx for tamiflu as both of his kids were just dx with flu yesterday. Spoke to Dr. Vita and he is ok for me to send in medication

## 2024-12-31 ENCOUNTER — Ambulatory Visit (INDEPENDENT_AMBULATORY_CARE_PROVIDER_SITE_OTHER): Admitting: Family Medicine

## 2024-12-31 VITALS — BP 128/70 | HR 77 | Wt 270.0 lb

## 2024-12-31 DIAGNOSIS — I152 Hypertension secondary to endocrine disorders: Secondary | ICD-10-CM | POA: Diagnosis not present

## 2024-12-31 DIAGNOSIS — E118 Type 2 diabetes mellitus with unspecified complications: Secondary | ICD-10-CM | POA: Diagnosis not present

## 2024-12-31 DIAGNOSIS — E785 Hyperlipidemia, unspecified: Secondary | ICD-10-CM

## 2024-12-31 DIAGNOSIS — E1159 Type 2 diabetes mellitus with other circulatory complications: Secondary | ICD-10-CM | POA: Diagnosis not present

## 2024-12-31 DIAGNOSIS — G4733 Obstructive sleep apnea (adult) (pediatric): Secondary | ICD-10-CM

## 2024-12-31 LAB — POCT GLYCOSYLATED HEMOGLOBIN (HGB A1C): Hemoglobin A1C: 6.7 % — AB (ref 4.0–5.6)

## 2024-12-31 NOTE — Progress Notes (Signed)
" ° °  Subjective:    Patient ID: Andre [name], male    DOB: 1981/02/09, 44 y.o.   MRN: [id number]  Discussed the use of AI scribe software for clinical note transcription with the patient, who gave verbal consent to proceed.  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  He has a previous history of severe L5 S1 disc herniation and subsequent surgery He was hospitalized from the end of August to the beginning of September due to severe L5 S1 disc herniation. During this time, he experienced loss of sensation in the groin, left leg, and foot, and was unable to move his left leg, describing it as 'dragging'. He also had severe pain, noting that even touching the hair on his head was painful, and he had bumps and knots on his head. Additionally, he experienced urinary incontinence, urinating without awareness. He was hospitalized for two to three weeks and described being in the hospital for two and a half days before surgery was performed.  He has type 2 diabetes, with an A1c that improved from 7.8% in September to 6.7% currently. His medication regimen includes Jardiance  10 mg, metformin  500 mg, and Ozempic  2 mg. He has lost over 100 pounds since starting Ozempic  several years ago, and his weight has stabilized between 264 and 270 pounds for several months. During his recent hospitalization, his weight decreased to 249 pounds due to illness.  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           Review of Systems     Objective:    Physical Exam Physical Exam   MEASUREMENTS: Weight- 269.      HbA1c 6.7      Assessment & Plan:   Assessment and Plan    Type 2 diabetes mellitus with circulatory complications Type 2 diabetes mellitus with improved glycemic control. Recent A1c is 6.7, down from 7.8 in September. Currently on Jardiance , metformin , and Ozempic . Weight is well-managed at 264-270 lbs after significant weight loss from 400 lbs. Discussion about potential switch to Mounjaro if weight remains stable. - Continue Jardiance , metformin , and Ozempic . - Discuss potential switch to Mounjaro with endocrinologist if weight remains stable in 3-4 months.  General Health Maintenance Requires DOT physical for work. Recent A1c result needed for documentation. - Provided A1c result for DOT documentation.        "

## 2025-01-08 ENCOUNTER — Ambulatory Visit: Admitting: Medical

## 2025-01-08 ENCOUNTER — Telehealth: Payer: Self-pay | Admitting: Internal Medicine

## 2025-01-08 NOTE — Telephone Encounter (Signed)
 Pt will call and let us  know

## 2025-01-08 NOTE — Telephone Encounter (Signed)
 Ozempic  is $470.25 due to new insurance this year and he can no longer able to afford these medications.   Is there something else?

## 2025-01-24 ENCOUNTER — Other Ambulatory Visit: Payer: Self-pay

## 2025-01-24 MED ORDER — AMLODIPINE BESYLATE 5 MG PO TABS: 5.0000 mg | ORAL_TABLET | Freq: Every day | ORAL | 1 refills | Status: AC

## 2025-01-29 ENCOUNTER — Telehealth: Payer: Self-pay | Admitting: Internal Medicine

## 2025-01-29 ENCOUNTER — Other Ambulatory Visit: Payer: Self-pay | Admitting: Medical

## 2025-01-29 MED ORDER — BUPROPION HCL ER (XL) 300 MG PO TB24: 300.0000 mg | ORAL_TABLET | ORAL | 0 refills | Status: AC

## 2025-01-29 NOTE — Telephone Encounter (Signed)
 Patient would like to know if he his wellbutrin  150mg  can be increased and refilled. He has been taking this everyday

## 2025-01-29 NOTE — Telephone Encounter (Signed)
 Pt.notified

## 2025-04-01 ENCOUNTER — Ambulatory Visit: Admitting: Medical
